# Patient Record
Sex: Male | Born: 1950
Health system: Southern US, Community
[De-identification: ages and names within clinical notes are randomized; demographics above are authoritative.]

## PROBLEM LIST (undated history)

## (undated) DIAGNOSIS — H409 Unspecified glaucoma: Secondary | ICD-10-CM

## (undated) DIAGNOSIS — E119 Type 2 diabetes mellitus without complications: Secondary | ICD-10-CM

## (undated) DIAGNOSIS — I639 Cerebral infarction, unspecified: Secondary | ICD-10-CM

## (undated) DIAGNOSIS — I10 Essential (primary) hypertension: Secondary | ICD-10-CM

## (undated) DIAGNOSIS — K219 Gastro-esophageal reflux disease without esophagitis: Secondary | ICD-10-CM

## (undated) DIAGNOSIS — G709 Myoneural disorder, unspecified: Secondary | ICD-10-CM

## (undated) DIAGNOSIS — E785 Hyperlipidemia, unspecified: Secondary | ICD-10-CM

## (undated) DIAGNOSIS — F329 Major depressive disorder, single episode, unspecified: Secondary | ICD-10-CM

## (undated) DIAGNOSIS — H548 Legal blindness, as defined in USA: Secondary | ICD-10-CM

## (undated) DIAGNOSIS — F32A Depression, unspecified: Secondary | ICD-10-CM

## (undated) DIAGNOSIS — G629 Polyneuropathy, unspecified: Secondary | ICD-10-CM

## (undated) DIAGNOSIS — N189 Chronic kidney disease, unspecified: Secondary | ICD-10-CM

## (undated) DIAGNOSIS — H269 Unspecified cataract: Secondary | ICD-10-CM

## (undated) DIAGNOSIS — M1990 Unspecified osteoarthritis, unspecified site: Secondary | ICD-10-CM

## (undated) HISTORY — DX: Myoneural disorder, unspecified: G70.9

## (undated) HISTORY — DX: Hyperlipidemia, unspecified: E78.5

## (undated) HISTORY — PX: CATARACT EXTRACTION: SUR2

## (undated) HISTORY — DX: Depression, unspecified: F32.A

## (undated) HISTORY — DX: Unspecified cataract: H26.9

## (undated) HISTORY — DX: Major depressive disorder, single episode, unspecified: F32.9

## (undated) HISTORY — DX: Legal blindness, as defined in USA: H54.8

## (undated) HISTORY — DX: Unspecified osteoarthritis, unspecified site: M19.90

---

## 2013-05-12 ENCOUNTER — Encounter (HOSPITAL_COMMUNITY): Payer: Self-pay | Admitting: Emergency Medicine

## 2013-05-12 ENCOUNTER — Emergency Department (HOSPITAL_COMMUNITY)
Admission: EM | Admit: 2013-05-12 | Discharge: 2013-05-13 | Disposition: A | Discharge status: Home / Self Care | Payer: Self-pay | Attending: Emergency Medicine | Admitting: Emergency Medicine

## 2013-05-12 DIAGNOSIS — E1169 Type 2 diabetes mellitus with other specified complication: Secondary | ICD-10-CM | POA: Insufficient documentation

## 2013-05-12 DIAGNOSIS — I10 Essential (primary) hypertension: Secondary | ICD-10-CM | POA: Insufficient documentation

## 2013-05-12 DIAGNOSIS — Z79899 Other long term (current) drug therapy: Secondary | ICD-10-CM | POA: Insufficient documentation

## 2013-05-12 DIAGNOSIS — Z7982 Long term (current) use of aspirin: Secondary | ICD-10-CM | POA: Insufficient documentation

## 2013-05-12 DIAGNOSIS — R42 Dizziness and giddiness: Secondary | ICD-10-CM | POA: Insufficient documentation

## 2013-05-12 DIAGNOSIS — F17200 Nicotine dependence, unspecified, uncomplicated: Secondary | ICD-10-CM | POA: Insufficient documentation

## 2013-05-12 DIAGNOSIS — E162 Hypoglycemia, unspecified: Secondary | ICD-10-CM

## 2013-05-12 HISTORY — DX: Essential (primary) hypertension: I10

## 2013-05-12 HISTORY — DX: Type 2 diabetes mellitus without complications: E11.9

## 2013-05-12 LAB — GLUCOSE, CAPILLARY: Glucose-Capillary: 79 mg/dL (ref 70–99)

## 2013-05-12 NOTE — ED Notes (Signed)
States he was changed to Glipizide 20 mg each am instead of 10mg  about 2-3 weeks ago and he has noted his glucose drops too much.  States he thinks the dose is too much

## 2013-05-12 NOTE — ED Notes (Signed)
Note;  Fiance reports she checked his cbg at home  Was 27  Gave him cola and called 911

## 2013-05-12 NOTE — ED Notes (Signed)
Patient presents to ER via [facility] with c/o low blood sugar.  On scene, fiance states patient was not acting right.

## 2013-05-12 NOTE — ED Notes (Signed)
Patient's CBG with FD on scene was 71.  CBG with EMS prior to arrival was 91.

## 2013-05-12 NOTE — ED Provider Notes (Signed)
CSN: [id number]     Arrival date & time 05/12/13  2021 History  This chart was scribed for Trevor Muskrat, MD by Jenne Campus, ED Scribe. This patient was seen in room APA09/APA09 and the patient's care was started at 11:36 PM.   Chief Complaint  Patient presents with  . Hypoglycemia    The history is provided by the patient. No language interpreter was used.   HPI Comments: Trevor Crawford is a 62 y.o. male with a h/o DM brought in by ambulance, who presents to the Emergency Department complaining of waxing and waning, persistent hypoglycemia for "a while", worse the past 2 days. Wife states that the CBGs have dropped into the 40s, will climb slowly towards baseline after drinking juice and then drop again. Wife states that she became concerned when the pt appeared confused and in a stupor like state tonight. Pt states that he felt lightheaded at the time but denies syncope. Pt is currently on metformin and glipizide twice daily and denies any recent missed doses. He denies being on any insulin currently. He reports chronic diarrhea attributed to his medications. He denies any recent fevers, emesis, SOB and rash as associated symptoms.  PCP is the [place] Pt is ex-[employer], finished his duty in the 2s.   Past Medical History  Diagnosis Date  . Diabetes mellitus without complication   . Hypertension    History reviewed. No pertinent past surgical history. No family history on file. History  Substance Use Topics  . Smoking status: Current Every Day Smoker  . Smokeless tobacco: Not on file  . Alcohol Use: Yes    Review of Systems  A complete 10 system review of systems was obtained and all systems are negative except as noted in the HPI and PMH.   Allergies  Review of patient's allergies indicates no known allergies.  Home Medications   Current Outpatient Rx  Name  Route  Sig  Dispense  Refill  . aspirin EC 81 MG tablet   Oral   Take 81 mg by mouth daily.         Marland Kitchen  atorvastatin (LIPITOR) 80 MG tablet   Oral   Take 40 mg by mouth at bedtime.         Marland Kitchen glipiZIDE (GLUCOTROL) 10 MG tablet   Oral   Take 20 mg by mouth 2 (two) times daily.         Marland Kitchen lisinopril (PRINIVIL,ZESTRIL) 40 MG tablet   Oral   Take 40 mg by mouth daily.         . metFORMIN (GLUCOPHAGE) 500 MG tablet   Oral   Take 1,000 mg by mouth 2 (two) times daily.         Marland Kitchen omeprazole (PRILOSEC) 20 MG capsule   Oral   Take 20 mg by mouth daily.          Triage Vitals: BP 158/72  Pulse 75  Temp(Src) 97.7 F (36.5 C) (Oral)  Resp 18  Ht 6' (1.829 m)  Wt 192 lb (87.091 kg)  BMI 26.03 kg/m2  SpO2 97%  Physical Exam  Nursing note and vitals reviewed. Constitutional: He is oriented to person, place, and time. He appears well-developed. No distress.  HENT:  Head: Normocephalic and atraumatic.  Eyes: Conjunctivae and EOM are normal.  Cardiovascular: Normal rate and regular rhythm.   Pulmonary/Chest: Effort normal. No stridor. No respiratory distress.  Abdominal: He exhibits no distension.  Musculoskeletal: He exhibits no edema.  Neurological: He  is alert and oriented to person, place, and time.  Skin: Skin is warm and dry.  Psychiatric: He has a normal mood and affect.    ED Course  Procedures (including critical care time)  Medications - No data to display  DIAGNOSTIC STUDIES: Oxygen Saturation is 97% on room air, normal by my interpretation.    COORDINATION OF CARE: 11:41 PM-Discussed treatment plan which includes medication adjustment, CBC and CMP with pt at bedside and pt agreed to plan.   Labs Review Labs Reviewed  GLUCOSE, CAPILLARY - Abnormal; Notable for the following:    Glucose-Capillary 56 (*)    All other components within normal limits  GLUCOSE, CAPILLARY   Imaging Review No results found.   2:53 AM On repeat exam the patient is eating peanut butter.  He and his family are aware of all results.  I had a lengthy discussion on medication  use, and given his description of weeks of labile sugar, with hypoglycemia his glipizide will be stopped. MDM  No diagnosis found.  I personally performed the services described in this documentation, which was scribed in my presence. The recorded information has been reviewed and is accurate.   Patient presents with concerns of ongoing hypoglycemia.  On my exam the patient is awake alert, with no neurologic deficits, beyond his blindness.  Per the patient's emergency room course remained hemodynamically stable.  He was hypoglycemic, but remained awake and alert, eating, drinking.  We had a lengthy discussion on medication use, hypoglycemia with use of multiple medications, and given the absence of distress, the chronicity of his concern, he was appropriate for discharge with further evaluation as an outpatient     Trevor Muskrat, MD 05/13/13 [phone]

## 2013-05-13 LAB — COMPREHENSIVE METABOLIC PANEL
AST: 22 U/L (ref 0–37)
Albumin: 3.5 g/dL (ref 3.5–5.2)
Alkaline Phosphatase: 98 U/L (ref 39–117)
BUN: 27 mg/dL — ABNORMAL HIGH (ref 6–23)
Creatinine, Ser: 1.44 mg/dL — ABNORMAL HIGH (ref 0.50–1.35)
Potassium: 4.4 mEq/L (ref 3.5–5.1)
Total Protein: 7.3 g/dL (ref 6.0–8.3)

## 2013-05-13 LAB — URINE MICROSCOPIC-ADD ON

## 2013-05-13 LAB — URINALYSIS, ROUTINE W REFLEX MICROSCOPIC
Glucose, UA: NEGATIVE mg/dL
Ketones, ur: NEGATIVE mg/dL
Leukocytes, UA: NEGATIVE
Urobilinogen, UA: 0.2 mg/dL (ref 0.0–1.0)
pH: 5.5 (ref 5.0–8.0)

## 2013-05-13 LAB — CBC WITH DIFFERENTIAL/PLATELET
Basophils Absolute: 0 10*3/uL (ref 0.0–0.1)
Basophils Relative: 1 % (ref 0–1)
Eosinophils Absolute: 0.4 10*3/uL (ref 0.0–0.7)
Eosinophils Relative: 5 % (ref 0–5)
Hemoglobin: 12 g/dL — ABNORMAL LOW (ref 13.0–17.0)
MCH: 29.3 pg (ref 26.0–34.0)
MCHC: 33.3 g/dL (ref 30.0–36.0)
MCV: 87.8 fL (ref 78.0–100.0)
Monocytes Absolute: 0.6 10*3/uL (ref 0.1–1.0)
Monocytes Relative: 7 % (ref 3–12)
Neutrophils Relative %: 61 % (ref 43–77)
Platelets: 233 10*3/uL (ref 150–400)
RDW: 13.5 % (ref 11.5–15.5)

## 2013-05-13 LAB — LACTIC ACID, PLASMA: Lactic Acid, Venous: 1.6 mmol/L (ref 0.5–2.2)

## 2015-06-28 ENCOUNTER — Emergency Department (HOSPITAL_COMMUNITY)
Admission: EM | Admit: 2015-06-28 | Discharge: 2015-06-28 | Disposition: A | Discharge status: Home / Self Care | Payer: Non-veteran care | Attending: Emergency Medicine | Admitting: Emergency Medicine

## 2015-06-28 ENCOUNTER — Encounter (HOSPITAL_COMMUNITY): Payer: Self-pay | Admitting: Emergency Medicine

## 2015-06-28 DIAGNOSIS — F17210 Nicotine dependence, cigarettes, uncomplicated: Secondary | ICD-10-CM | POA: Diagnosis not present

## 2015-06-28 DIAGNOSIS — N3000 Acute cystitis without hematuria: Secondary | ICD-10-CM

## 2015-06-28 DIAGNOSIS — E119 Type 2 diabetes mellitus without complications: Secondary | ICD-10-CM | POA: Insufficient documentation

## 2015-06-28 DIAGNOSIS — Z79899 Other long term (current) drug therapy: Secondary | ICD-10-CM | POA: Diagnosis not present

## 2015-06-28 DIAGNOSIS — R300 Dysuria: Secondary | ICD-10-CM | POA: Diagnosis present

## 2015-06-28 DIAGNOSIS — I10 Essential (primary) hypertension: Secondary | ICD-10-CM | POA: Insufficient documentation

## 2015-06-28 DIAGNOSIS — Z7982 Long term (current) use of aspirin: Secondary | ICD-10-CM | POA: Diagnosis not present

## 2015-06-28 LAB — BASIC METABOLIC PANEL
ANION GAP: 10 (ref 5–15)
BUN: 36 mg/dL — AB (ref 6–20)
CALCIUM: 9.3 mg/dL (ref 8.9–10.3)
CO2: 23 mmol/L (ref 22–32)
Chloride: 104 mmol/L (ref 101–111)
Creatinine, Ser: 2.1 mg/dL — ABNORMAL HIGH (ref 0.61–1.24)
GFR calc Af Amer: 37 mL/min — ABNORMAL LOW (ref >60)
GFR calc non Af Amer: 32 mL/min — ABNORMAL LOW (ref >60)
GLUCOSE: 51 mg/dL — AB (ref 65–99)
POTASSIUM: 3.9 mmol/L (ref 3.5–5.1)
SODIUM: 137 mmol/L (ref 135–145)

## 2015-06-28 LAB — URINALYSIS, ROUTINE W REFLEX MICROSCOPIC
Bilirubin Urine: NEGATIVE
GLUCOSE, UA: NEGATIVE mg/dL
KETONES UR: NEGATIVE mg/dL
Nitrite: NEGATIVE
PROTEIN: 30 mg/dL — AB
Specific Gravity, Urine: 1.025 (ref 1.005–1.030)
pH: 5 (ref 5.0–8.0)

## 2015-06-28 LAB — URINE MICROSCOPIC-ADD ON

## 2015-06-28 MED ORDER — CEPHALEXIN 500 MG PO CAPS: 500.0000 mg | ORAL_CAPSULE | Freq: Four times a day (QID) | ORAL | Status: DC

## 2015-06-28 NOTE — ED Notes (Signed)
Pt reports urinary frequency and "stinging" when he urinates.

## 2015-06-28 NOTE — ED Provider Notes (Signed)
CSN: [id number]     Arrival date & time 06/28/15  1322 History   First MD Initiated Contact with Patient 06/28/15 1729     Chief Complaint  Patient presents with  . Urinary Tract Infection     (Consider location/radiation/quality/duration/timing/severity/associated sxs/prior Treatment) HPI 64 year old male who presents with dysuria. History of hypertension and diabetes. States for the past 2 days he has been having episodes of urinary frequency, dysuria, and urinary hesitancy. Feels that he has had incomplete voiding during this time as well. Denies any abdominal pain, flank pain or back pain, hematuria, nausea or vomiting, diarrhea, fevers or chills. States that he otherwise feels as if he is in his usual state of health. Denies having any prostate issues, and denies any known history of prior UTIs. Past Medical History  Diagnosis Date  . Diabetes mellitus without complication ([place])   . Hypertension    History reviewed. No pertinent past surgical history. Family History  Problem Relation Age of Onset  . Cancer Mother   . Alcoholism Father   . Cancer Other    Social History  Substance Use Topics  . Smoking status: Current Every Day Smoker -- 1.00 packs/day    Types: Cigarettes  . Smokeless tobacco: Never Used  . Alcohol Use: No    Review of Systems 10/14 systems reviewed and are negative other than those stated in the HPI   Allergies  Review of patient's allergies indicates no known allergies.  Home Medications   Prior to Admission medications   Medication Sig Start Date End Date Taking? Authorizing Provider  aspirin EC 81 MG tablet Take 81 mg by mouth daily.   Yes Historical Provider, MD  atorvastatin (LIPITOR) 80 MG tablet Take 40 mg by mouth at bedtime.    Historical Provider, MD  cephALEXin (KEFLEX) 500 MG capsule Take 1 capsule (500 mg total) by mouth 4 (four) times daily. 06/28/15   Forde Dandy, MD  lisinopril (PRINIVIL,ZESTRIL) 40 MG tablet Take 40 mg by mouth  daily.    Historical Provider, MD  metFORMIN (GLUCOPHAGE) 500 MG tablet Take 1,000 mg by mouth 2 (two) times daily.    Historical Provider, MD  omeprazole (PRILOSEC) 20 MG capsule Take 20 mg by mouth daily.    Historical Provider, MD   BP 147/79 mmHg  Pulse 91  Temp(Src) 98 F (36.7 C) (Oral)  Resp 18  Ht 6' (1.829 m)  Wt 192 lb (87.091 kg)  BMI 26.03 kg/m2  SpO2 100% Physical Exam Physical Exam  Nursing note and vitals reviewed. Constitutional: Well developed, well nourished, non-toxic, and in no acute distress Head: Normocephalic and atraumatic.  Mouth/Throat: Oropharynx is clear and moist.  Neck: Normal range of motion. Neck supple.  Cardiovascular: Normal rate and regular rhythm.   Pulmonary/Chest: Effort normal and breath sounds normal.  Abdominal: Soft. There is no tenderness. There is no rebound and no guarding. No CVA tenderness. Musculoskeletal: Normal range of motion.  Neurological: Alert, no facial droop, fluent speech, moves all extremities symmetrically Skin: Skin is warm and dry.  Psychiatric: Cooperative  ED Course  Procedures (including critical care time) Labs Review Labs Reviewed  URINALYSIS, ROUTINE W REFLEX MICROSCOPIC (NOT AT [facility]) - Abnormal; Notable for the following:    Hgb urine dipstick SMALL (*)    Protein, ur 30 (*)    Leukocytes, UA TRACE (*)    All other components within normal limits  BASIC METABOLIC PANEL - Abnormal; Notable for the following:    Glucose, Bld 51 (*)  BUN 36 (*)    Creatinine, Ser 2.10 (*)    GFR calc non Af Amer 32 (*)    GFR calc Af Amer 37 (*)    All other components within normal limits  URINE MICROSCOPIC-ADD ON - Abnormal; Notable for the following:    Squamous Epithelial / LPF 0-5 (*)    Bacteria, UA FEW (*)    Casts HYALINE CASTS (*)    All other components within normal limits  URINE CULTURE       I have personally reviewed and evaluated these images and lab results as part of my medical  decision-making.    MDM   Final diagnoses:  Acute cystitis without hematuria    64 year old male who presents with 2 days of dysuria and urinary frequency. Well-appearing with stable vital signs. Abdomen is benign and no evidence of CVA tenderness. No concern for pyelonephritis or systemic illness. UA does show evidence of bacteria 30 WBCs and leukocyte esterase. This is sent for culture. He is treated for cystis with course of keflex. Urine culture is sent and pending. Strict return and follow-up instructions are reviewed. He expressed understanding of all discharge instructions for comfortable to plan of care.    Forde Dandy, MD 06/28/15 [place]

## 2015-06-28 NOTE — Discharge Instructions (Signed)
Please take antibiotics as prescribed. Return without fail for worsening symptoms including fevers, vomiting unable to keep down food or fluids, severe abdominal or back pain, or any other symptoms concerning to you.  Urinary Tract Infection A urinary tract infection (UTI) can occur any place along the urinary tract. The tract includes the kidneys, ureters, bladder, and urethra. A type of germ called bacteria often causes a UTI. UTIs are often helped with antibiotic medicine.  HOME CARE   If given, take antibiotics as told by your doctor. Finish them even if you start to feel better.  Drink enough fluids to keep your pee (urine) clear or pale yellow.  Avoid tea, drinks with caffeine, and bubbly (carbonated) drinks.  Pee often. Avoid holding your pee in for a long time.  Pee before and after having sex (intercourse).  Wipe from front to back after you poop (bowel movement) if you are a woman. Use each tissue only once. GET HELP RIGHT AWAY IF:   You have back pain.  You have lower belly (abdominal) pain.  You have chills.  You feel sick to your stomach (nauseous).  You throw up (vomit).  Your burning or discomfort with peeing does not go away.  You have a fever.  Your symptoms are not better in 3 days. MAKE SURE YOU:   Understand these instructions.  Will watch your condition.  Will get help right away if you are not doing well or get worse.   This information is not intended to replace advice given to you by your health care provider. Make sure you discuss any questions you have with your health care provider.   Document Released: 01/09/2008 Document Revised: 08/13/2014 Document Reviewed: 02/21/2012 Elsevier Interactive Patient Education [employer].

## 2015-06-28 NOTE — ED Notes (Signed)
Bladder can revealed 335 ml post void residual.

## 2015-06-29 LAB — URINE CULTURE

## 2015-10-05 ENCOUNTER — Encounter (HOSPITAL_COMMUNITY): Payer: Self-pay | Admitting: Emergency Medicine

## 2015-10-05 ENCOUNTER — Emergency Department (HOSPITAL_COMMUNITY): Payer: Non-veteran care

## 2015-10-05 ENCOUNTER — Observation Stay (HOSPITAL_COMMUNITY)
Admission: EM | Admit: 2015-10-05 | Discharge: 2015-10-07 | Disposition: A | Discharge status: Home / Self Care | Payer: Non-veteran care | Attending: Urology | Admitting: Urology

## 2015-10-05 DIAGNOSIS — F17210 Nicotine dependence, cigarettes, uncomplicated: Secondary | ICD-10-CM | POA: Diagnosis not present

## 2015-10-05 DIAGNOSIS — N23 Unspecified renal colic: Secondary | ICD-10-CM

## 2015-10-05 DIAGNOSIS — Z7982 Long term (current) use of aspirin: Secondary | ICD-10-CM | POA: Insufficient documentation

## 2015-10-05 DIAGNOSIS — I10 Essential (primary) hypertension: Secondary | ICD-10-CM | POA: Diagnosis not present

## 2015-10-05 DIAGNOSIS — N200 Calculus of kidney: Secondary | ICD-10-CM

## 2015-10-05 DIAGNOSIS — Z79899 Other long term (current) drug therapy: Secondary | ICD-10-CM | POA: Diagnosis not present

## 2015-10-05 DIAGNOSIS — E119 Type 2 diabetes mellitus without complications: Secondary | ICD-10-CM | POA: Insufficient documentation

## 2015-10-05 DIAGNOSIS — N132 Hydronephrosis with renal and ureteral calculous obstruction: Secondary | ICD-10-CM | POA: Diagnosis not present

## 2015-10-05 DIAGNOSIS — Z794 Long term (current) use of insulin: Secondary | ICD-10-CM | POA: Insufficient documentation

## 2015-10-05 DIAGNOSIS — R109 Unspecified abdominal pain: Secondary | ICD-10-CM | POA: Diagnosis present

## 2015-10-05 DIAGNOSIS — N1330 Unspecified hydronephrosis: Secondary | ICD-10-CM

## 2015-10-05 DIAGNOSIS — N134 Hydroureter: Secondary | ICD-10-CM

## 2015-10-05 DIAGNOSIS — N201 Calculus of ureter: Secondary | ICD-10-CM | POA: Diagnosis present

## 2015-10-05 LAB — URINALYSIS, ROUTINE W REFLEX MICROSCOPIC
Bilirubin Urine: NEGATIVE
Glucose, UA: NEGATIVE mg/dL
Ketones, ur: NEGATIVE mg/dL
Leukocytes, UA: NEGATIVE
NITRITE: NEGATIVE
Protein, ur: 100 mg/dL — AB
SPECIFIC GRAVITY, URINE: 1.02 (ref 1.005–1.030)
pH: 6.5 (ref 5.0–8.0)

## 2015-10-05 LAB — COMPREHENSIVE METABOLIC PANEL
ALBUMIN: 3.9 g/dL (ref 3.5–5.0)
ALT: 22 U/L (ref 17–63)
ANION GAP: 9 (ref 5–15)
AST: 20 U/L (ref 15–41)
Alkaline Phosphatase: 92 U/L (ref 38–126)
BILIRUBIN TOTAL: 0.5 mg/dL (ref 0.3–1.2)
BUN: 30 mg/dL — ABNORMAL HIGH (ref 6–20)
CO2: 26 mmol/L (ref 22–32)
Calcium: 9.4 mg/dL (ref 8.9–10.3)
Chloride: 107 mmol/L (ref 101–111)
Creatinine, Ser: 2.05 mg/dL — ABNORMAL HIGH (ref 0.61–1.24)
GFR calc Af Amer: 38 mL/min — ABNORMAL LOW (ref >60)
GFR, EST NON AFRICAN AMERICAN: 33 mL/min — AB (ref >60)
Glucose, Bld: 81 mg/dL (ref 65–99)
POTASSIUM: 5 mmol/L (ref 3.5–5.1)
Sodium: 142 mmol/L (ref 135–145)
TOTAL PROTEIN: 7.5 g/dL (ref 6.5–8.1)

## 2015-10-05 LAB — CBC
HCT: 39.8 % (ref 39.0–52.0)
HEMOGLOBIN: 13.1 g/dL (ref 13.0–17.0)
MCH: 29.4 pg (ref 26.0–34.0)
MCHC: 32.9 g/dL (ref 30.0–36.0)
MCV: 89.4 fL (ref 78.0–100.0)
Platelets: 213 10*3/uL (ref 150–400)
RBC: 4.45 MIL/uL (ref 4.22–5.81)
RDW: 14.3 % (ref 11.5–15.5)
WBC: 9 10*3/uL (ref 4.0–10.5)

## 2015-10-05 LAB — LIPASE, BLOOD: Lipase: 25 U/L (ref 11–51)

## 2015-10-05 LAB — URINE MICROSCOPIC-ADD ON

## 2015-10-05 MED ORDER — ONDANSETRON HCL 4 MG/2ML IJ SOLN
4.0000 mg | INTRAMUSCULAR | Status: AC | PRN
  Administered 2015-10-05 (×2): 4 mg via INTRAVENOUS
  Filled 2015-10-05 (×2): qty 2

## 2015-10-05 MED ORDER — MORPHINE SULFATE (PF) 4 MG/ML IV SOLN
4.0000 mg | INTRAVENOUS | Status: AC | PRN
  Administered 2015-10-05 (×2): 4 mg via INTRAVENOUS
  Filled 2015-10-05 (×2): qty 1

## 2015-10-05 MED ORDER — ATORVASTATIN CALCIUM 40 MG PO TABS
40.0000 mg | ORAL_TABLET | Freq: Every day | ORAL | Status: DC
  Administered 2015-10-06: 40 mg via ORAL
  Filled 2015-10-05: qty 1

## 2015-10-05 MED ORDER — LATANOPROST 0.005 % OP SOLN
1.0000 [drp] | Freq: Every day | OPHTHALMIC | Status: DC
  Administered 2015-10-06: 1 [drp] via OPHTHALMIC
  Filled 2015-10-05: qty 2.5

## 2015-10-05 MED ORDER — FUROSEMIDE 40 MG PO TABS
40.0000 mg | ORAL_TABLET | Freq: Every morning | ORAL | Status: DC
  Administered 2015-10-06: 40 mg via ORAL
  Filled 2015-10-05: qty 1

## 2015-10-05 MED ORDER — ASPIRIN EC 81 MG PO TBEC
81.0000 mg | DELAYED_RELEASE_TABLET | Freq: Every day | ORAL | Status: DC
  Administered 2015-10-06: 81 mg via ORAL
  Filled 2015-10-05: qty 1

## 2015-10-05 MED ORDER — PANTOPRAZOLE SODIUM 40 MG PO TBEC
40.0000 mg | DELAYED_RELEASE_TABLET | Freq: Every day | ORAL | Status: DC
  Administered 2015-10-06: 40 mg via ORAL
  Filled 2015-10-05: qty 1

## 2015-10-05 NOTE — ED Notes (Signed)
Report to [place], [place], [employer] 4th floor

## 2015-10-05 NOTE — ED Notes (Signed)
C/o left lower abdominal pain since this am, rates pain 10/10.  Did not take any pain medication this am.

## 2015-10-05 NOTE — ED Notes (Signed)
Pt reports that he had left flank and groin pain since this am- Abd is soft non tender. Pt denies pain with urination   Pt is blind

## 2015-10-05 NOTE — ED Provider Notes (Signed)
CSN: [id number]     Arrival date & time 10/05/15  1244 History   First MD Initiated Contact with Patient 10/05/15 1546     Chief Complaint  Patient presents with  . Abdominal Pain  . Flank Pain      HPI Pt was seen at 1600. Per pt, c/o sudden onset and persistence of waxing and waning left sided flank "pain" that began several months ago, worse since this morning.  Pt describes the pain as radiating into the left side of his abd.  Has been associated with no other symptoms.  Denies testicular pain/swelling, no dysuria/hematuria, no abd pain, no diarrhea, no black or blood in emesis, no CP/SOB, no fevers, no rash.      Past Medical History  Diagnosis Date  . Diabetes mellitus without complication ([place])   . Hypertension    History reviewed. No pertinent past surgical history.   Family History  Problem Relation Age of Onset  . Cancer Mother   . Alcoholism Father   . Cancer Other    Social History  Substance Use Topics  . Smoking status: Current Every Day Smoker -- 1.00 packs/day    Types: Cigarettes  . Smokeless tobacco: Never Used  . Alcohol Use: No    Review of Systems ROS: Statement: All systems negative except as marked or noted in the HPI; Constitutional: Negative for fever and chills. ; ; Eyes: Negative for eye pain, redness and discharge. ; ; ENMT: Negative for ear pain, hoarseness, nasal congestion, sinus pressure and sore throat. ; ; Cardiovascular: Negative for chest pain, palpitations, diaphoresis, dyspnea and peripheral edema. ; ; Respiratory: Negative for cough, wheezing and stridor. ; ; Gastrointestinal: +abd pain. Negative for nausea, vomiting, diarrhea, blood in stool, hematemesis, jaundice and rectal bleeding. . ; ; Genitourinary: Negative for dysuria, flank pain and hematuria. ; ; Genital:  No penile drainage or rash, no testicular pain or swelling, no scrotal rash or swelling. ;; Musculoskeletal: +back pain. Negative for neck pain. Negative for swelling and  trauma.; ; Skin: Negative for pruritus, rash, abrasions, blisters, bruising and skin lesion.; ; Neuro: Negative for headache, lightheadedness and neck stiffness. Negative for weakness, altered level of consciousness , altered mental status, extremity weakness, paresthesias, involuntary movement, seizure and syncope.      Allergies  Review of patient's allergies indicates no known allergies.  Home Medications   Prior to Admission medications   Medication Sig Start Date End Date Taking? Authorizing Provider  aspirin EC 81 MG tablet Take 81 mg by mouth daily.    Historical Provider, MD  atorvastatin (LIPITOR) 80 MG tablet Take 40 mg by mouth at bedtime.    Historical Provider, MD  cephALEXin (KEFLEX) 500 MG capsule Take 1 capsule (500 mg total) by mouth 4 (four) times daily. 06/28/15   Forde Dandy, MD  lisinopril (PRINIVIL,ZESTRIL) 40 MG tablet Take 40 mg by mouth daily.    Historical Provider, MD  metFORMIN (GLUCOPHAGE) 500 MG tablet Take 1,000 mg by mouth 2 (two) times daily.    Historical Provider, MD  omeprazole (PRILOSEC) 20 MG capsule Take 20 mg by mouth daily.    Historical Provider, MD   BP 177/83 mmHg  Pulse 65  Temp(Src) 97.8 F (36.6 C) (Oral)  Resp 16  Ht 6' (1.829 m)  Wt 198 lb (89.812 kg)  BMI 26.85 kg/m2  SpO2 99% Physical Exam  1605: Physical examination:  Nursing notes reviewed; Vital signs and O2 SAT reviewed;  Constitutional: Thin, In no acute  distress; Head:  Normocephalic, atraumatic; Eyes: EOMI, PERRL, No scleral icterus; ENMT: Mouth and pharynx normal, Mucous membranes dry; Neck: Supple, Full range of motion, No lymphadenopathy; Cardiovascular: Regular rate and rhythm, No gallop; Respiratory: Breath sounds clear & equal bilaterally, No wheezes.  Speaking full sentences with ease, Normal respiratory effort/excursion; Chest: Nontender, Movement normal; Abdomen: Soft, Nontender, Nondistended, Normal bowel sounds.; Genitourinary: No CVA tenderness; Spine:  No midline CS,  TS, LS tenderness. +TTP left lumbar paraspinal muscles and lateral torso areas.;; Extremities: Pulses normal, No tenderness, No edema, No calf edema or asymmetry.; Neuro: AA&Ox3, +eye patch over right eye. Major CN grossly intact.  Speech clear. No gross focal motor or sensory deficits in extremities.; Skin: Color normal, Warm, Dry.   ED Course  Procedures (including critical care time) Labs Review   Imaging Review  I have personally reviewed and evaluated these images and lab results as part of my medical decision-making.   EKG Interpretation None      MDM  MDM Reviewed: previous chart, nursing note and vitals Reviewed previous: labs Interpretation: labs and CT scan     Results for orders placed or performed during the hospital encounter of 10/05/15  Lipase, blood  Result Value Ref Range   Lipase 25 11 - 51 U/L  Comprehensive metabolic panel  Result Value Ref Range   Sodium 142 135 - 145 mmol/L   Potassium 5.0 3.5 - 5.1 mmol/L   Chloride 107 101 - 111 mmol/L   CO2 26 22 - 32 mmol/L   Glucose, Bld 81 65 - 99 mg/dL   BUN 30 (H) 6 - 20 mg/dL   Creatinine, Ser 2.05 (H) 0.61 - 1.24 mg/dL   Calcium 9.4 8.9 - 10.3 mg/dL   Total Protein 7.5 6.5 - 8.1 g/dL   Albumin 3.9 3.5 - 5.0 g/dL   AST 20 15 - 41 U/L   ALT 22 17 - 63 U/L   Alkaline Phosphatase 92 38 - 126 U/L   Total Bilirubin 0.5 0.3 - 1.2 mg/dL   GFR calc non Af Amer 33 (L) >60 mL/min   GFR calc Af Amer 38 (L) >60 mL/min   Anion gap 9 5 - 15  CBC  Result Value Ref Range   WBC 9.0 4.0 - 10.5 K/uL   RBC 4.45 4.22 - 5.81 MIL/uL   Hemoglobin 13.1 13.0 - 17.0 g/dL   HCT 39.8 39.0 - 52.0 %   MCV 89.4 78.0 - 100.0 fL   MCH 29.4 26.0 - 34.0 pg   MCHC 32.9 30.0 - 36.0 g/dL   RDW 14.3 11.5 - 15.5 %   Platelets 213 150 - 400 K/uL  Urinalysis, Routine w reflex microscopic (not at [facility])  Result Value Ref Range   Color, Urine YELLOW YELLOW   APPearance CLEAR CLEAR   Specific Gravity, Urine 1.020 1.005 - 1.030   pH 6.5  5.0 - 8.0   Glucose, UA NEGATIVE NEGATIVE mg/dL   Hgb urine dipstick SMALL (A) NEGATIVE   Bilirubin Urine NEGATIVE NEGATIVE   Ketones, ur NEGATIVE NEGATIVE mg/dL   Protein, ur 100 (A) NEGATIVE mg/dL   Nitrite NEGATIVE NEGATIVE   Leukocytes, UA NEGATIVE NEGATIVE  Urine microscopic-add on  Result Value Ref Range   Squamous Epithelial / LPF 0-5 (A) NONE SEEN   WBC, UA 0-5 0 - 5 WBC/hpf   RBC / HPF 6-30 0 - 5 RBC/hpf   Bacteria, UA RARE (A) NONE SEEN   Sperm, UA PRESENT    Ct Renal  Stone Study 10/05/2015  CLINICAL DATA:  Left flank flank pain and lower abdominal pain starting today EXAM: CT ABDOMEN AND PELVIS WITHOUT CONTRAST TECHNIQUE: Multidetector CT imaging of the abdomen and pelvis was performed following the standard protocol without IV contrast. COMPARISON:  None. FINDINGS: Lung bases are unremarkable.  Small hiatal hernia. Mild degenerative changes thoracolumbar spine. Unenhanced liver shows no biliary ductal dilatation. No calcified gallstones are noted within gallbladder. Atherosclerotic calcifications are noted bilateral renal artery origin. Unenhanced pancreas spleen and adrenal glands are unremarkable. There is mild left hydronephrosis and proximal left hydroureter. Significant left perinephric and left upper periureteral stranding At least 2 or 3 calcified calculi are noted within left kidney the largest in lower pole measures 1.1 cm. Multiple obstructive calculi are noted in proximal left ureter. The largest in coronal image 51 measures about 5 mm about lower endplate of L3 vertebral body. Additional 3 or 4 calculi are noted in proximal left ureter in a linear distribution please see coronal image 51. There is another calculus in mid left ureter measures about 4 mm axial image 45 at the level of lower endplate of L4 vertebral body. Bilateral distal ureter is unremarkable. No calcified calculi are noted within urinary bladder. There is a punctate nonobstructive calculus in lower pole of the  right kidney measures 3 mm. No small bowel obstruction. Atherosclerotic calcifications are noted bilateral common iliac artery and external iliac arteries. Prostate gland is unremarkable. No ascites or free air. No pericecal inflammation. Normal appendix is noted in axial image 47. Colonic diverticula are noted in right colon. There is no evidence of acute diverticulitis. Some colonic stool noted throughout the colon. IMPRESSION: 1. There is mild left hydronephrosis and proximal left hydroureter. Bilateral nephrolithiasis. Significant left perinephric and proximal left periureteral stranding. 2. Multiple partially obstructive calculi are noted in proximal left ureter in a string/ linear distribution. The largest in proximal left ureter about the lower endplate level of L3 vertebral body measures 5 mm. The largest in mid left ureter about the level of lower endplate of L4 vertebral body measures about 4 mm. No distal ureteral calculi are noted. No calcified calculi are noted within urinary bladder. 3. No small bowel obstruction. 4. Normal appendix.  No pericecal inflammation. 5. Unremarkable prostate gland. 6. Mild degenerative changes lumbar spine. These results were called by telephone at the time of interpretation on 10/05/2015 at 5:06 pm to Dr. Francine Graven , who verbally acknowledged these results. Electronically Signed   By: Lahoma Crocker M.D.   On: 10/05/2015 17:06   Results for Trevor Crawford, Trevor Crawford (MRN [id number]) as of 10/05/2015 20:08  Ref. Range 05/12/2013 23:58 06/28/2015 18:06 10/05/2015 13:51  BUN Latest Ref Range: 6-20 mg/dL 27 (H) 36 (H) 30 (H)  Creatinine Latest Ref Range: 0.61-1.24 mg/dL 1.44 (H) 2.10 (H) 2.05 (H)   1750:  CT as above. Cr elevated for past several months from baseline. T/C to Uro Dr. Alyson Ingles, case discussed, including:  HPI, pertinent PM/SHx, VS/PE, dx testing, ED course and treatment:  Agrees with ED workup, states pt will be unable to pass stones on his own and will need intervention,  if can get pt's pain under control he can be discharged to f/u in [facility] clinic tomorrow; if cannot get pain under control then call back.  1945:  Pt continues to c/o pain and nausea despite multiple doses of meds. Will re-dose.  T/C to Uro Dr. Alyson Ingles, case discussed, including:  HPI, pertinent PM/SHx, VS/PE, dx testing, ED course and  treatment:  Agreeable to accept transfer/admit to [facility], requests to write temporary orders, obtain 4th floor medical bed to his service.   Francine Graven, DO 10/08/15 1609

## 2015-10-06 ENCOUNTER — Encounter (HOSPITAL_COMMUNITY): Payer: Self-pay

## 2015-10-06 ENCOUNTER — Observation Stay (HOSPITAL_COMMUNITY): Payer: Non-veteran care

## 2015-10-06 ENCOUNTER — Observation Stay (HOSPITAL_COMMUNITY): Payer: Non-veteran care | Admitting: Anesthesiology

## 2015-10-06 ENCOUNTER — Other Ambulatory Visit: Payer: Self-pay | Admitting: Urology

## 2015-10-06 ENCOUNTER — Encounter (HOSPITAL_COMMUNITY)
Admission: EM | Disposition: A | Discharge status: Home / Self Care | Payer: Self-pay | Source: Home / Self Care | Attending: Emergency Medicine

## 2015-10-06 ENCOUNTER — Other Ambulatory Visit: Payer: Self-pay

## 2015-10-06 DIAGNOSIS — E119 Type 2 diabetes mellitus without complications: Secondary | ICD-10-CM | POA: Diagnosis not present

## 2015-10-06 DIAGNOSIS — I10 Essential (primary) hypertension: Secondary | ICD-10-CM | POA: Diagnosis not present

## 2015-10-06 DIAGNOSIS — Z7982 Long term (current) use of aspirin: Secondary | ICD-10-CM | POA: Diagnosis not present

## 2015-10-06 DIAGNOSIS — N200 Calculus of kidney: Secondary | ICD-10-CM

## 2015-10-06 DIAGNOSIS — N132 Hydronephrosis with renal and ureteral calculous obstruction: Secondary | ICD-10-CM | POA: Diagnosis not present

## 2015-10-06 HISTORY — PX: CYSTOSCOPY WITH RETROGRADE PYELOGRAM, URETEROSCOPY AND STENT PLACEMENT: SHX5789

## 2015-10-06 LAB — GLUCOSE, CAPILLARY
GLUCOSE-CAPILLARY: 80 mg/dL (ref 65–99)
GLUCOSE-CAPILLARY: 78 mg/dL (ref 65–99)
GLUCOSE-CAPILLARY: 83 mg/dL (ref 65–99)
GLUCOSE-CAPILLARY: 274 mg/dL — AB (ref 65–99)
Glucose-Capillary: 52 mg/dL — ABNORMAL LOW (ref 65–99)
Glucose-Capillary: 94 mg/dL (ref 65–99)

## 2015-10-06 LAB — SURGICAL PCR SCREEN
MRSA, PCR: NEGATIVE
Staphylococcus aureus: NEGATIVE

## 2015-10-06 SURGERY — CYSTOURETEROSCOPY, WITH RETROGRADE PYELOGRAM AND STENT INSERTION
Anesthesia: General | Laterality: Left

## 2015-10-06 MED ORDER — DIPHENHYDRAMINE HCL 50 MG/ML IJ SOLN: 12.5000 mg | Freq: Four times a day (QID) | INTRAMUSCULAR | Status: DC | PRN

## 2015-10-06 MED ORDER — SODIUM CHLORIDE 0.9 % IR SOLN
Status: DC | PRN
  Administered 2015-10-06: 500 mL

## 2015-10-06 MED ORDER — ONDANSETRON HCL 4 MG/2ML IJ SOLN: 4.0000 mg | INTRAMUSCULAR | Status: DC | PRN

## 2015-10-06 MED ORDER — OXYCODONE HCL 5 MG PO TABS: 5.0000 mg | ORAL_TABLET | ORAL | Status: DC | PRN

## 2015-10-06 MED ORDER — CEFAZOLIN SODIUM-DEXTROSE 2-3 GM-% IV SOLR
INTRAVENOUS | Status: AC
  Filled 2015-10-06: qty 50

## 2015-10-06 MED ORDER — BRIMONIDINE TARTRATE 0.2 % OP SOLN
1.0000 [drp] | Freq: Two times a day (BID) | OPHTHALMIC | Status: DC
  Administered 2015-10-06 – 2015-10-07 (×2): 1 [drp] via OPHTHALMIC
  Filled 2015-10-06 (×2): qty 5

## 2015-10-06 MED ORDER — PROPOFOL 10 MG/ML IV BOLUS
INTRAVENOUS | Status: AC
  Filled 2015-10-06: qty 20

## 2015-10-06 MED ORDER — SODIUM CHLORIDE 0.9 % IV SOLN: INTRAVENOUS | Status: DC

## 2015-10-06 MED ORDER — LIDOCAINE HCL (CARDIAC) 20 MG/ML IV SOLN
INTRAVENOUS | Status: AC
  Filled 2015-10-06: qty 5

## 2015-10-06 MED ORDER — ACETAMINOPHEN 325 MG PO TABS: 650.0000 mg | ORAL_TABLET | ORAL | Status: DC | PRN

## 2015-10-06 MED ORDER — PROPOFOL 10 MG/ML IV BOLUS
INTRAVENOUS | Status: DC | PRN
  Administered 2015-10-06: 200 mg via INTRAVENOUS

## 2015-10-06 MED ORDER — DEXTROSE 50 % IV SOLN
INTRAVENOUS | Status: AC
  Administered 2015-10-06: 25 mL
  Filled 2015-10-06: qty 50

## 2015-10-06 MED ORDER — ONDANSETRON HCL 4 MG/2ML IJ SOLN: 4.0000 mg | Freq: Three times a day (TID) | INTRAMUSCULAR | Status: DC | PRN

## 2015-10-06 MED ORDER — DIPHENHYDRAMINE HCL 12.5 MG/5ML PO ELIX: 12.5000 mg | ORAL_SOLUTION | Freq: Four times a day (QID) | ORAL | Status: DC | PRN

## 2015-10-06 MED ORDER — FENTANYL CITRATE (PF) 250 MCG/5ML IJ SOLN
INTRAMUSCULAR | Status: AC
  Filled 2015-10-06: qty 5

## 2015-10-06 MED ORDER — INSULIN ASPART 100 UNIT/ML ~~LOC~~ SOLN
0.0000 [IU] | Freq: Three times a day (TID) | SUBCUTANEOUS | Status: DC
  Administered 2015-10-07: 5 [IU] via SUBCUTANEOUS

## 2015-10-06 MED ORDER — ZOLPIDEM TARTRATE 5 MG PO TABS: 5.0000 mg | ORAL_TABLET | Freq: Every evening | ORAL | Status: DC | PRN

## 2015-10-06 MED ORDER — ONDANSETRON HCL 4 MG/2ML IJ SOLN
INTRAMUSCULAR | Status: DC | PRN
Start: 2015-10-06 — End: 2015-10-06
  Administered 2015-10-06: 4 mg via INTRAVENOUS

## 2015-10-06 MED ORDER — DEXAMETHASONE SODIUM PHOSPHATE 10 MG/ML IJ SOLN
INTRAMUSCULAR | Status: AC
  Filled 2015-10-06: qty 1

## 2015-10-06 MED ORDER — DORZOLAMIDE HCL-TIMOLOL MAL 2-0.5 % OP SOLN
1.0000 [drp] | Freq: Two times a day (BID) | OPHTHALMIC | Status: DC
  Administered 2015-10-06 – 2015-10-07 (×2): 1 [drp] via OPHTHALMIC
  Filled 2015-10-06 (×2): qty 10

## 2015-10-06 MED ORDER — SUCCINYLCHOLINE CHLORIDE 20 MG/ML IJ SOLN
INTRAMUSCULAR | Status: DC | PRN
  Administered 2015-10-06: 100 mg via INTRAVENOUS

## 2015-10-06 MED ORDER — ALBUTEROL SULFATE HFA 108 (90 BASE) MCG/ACT IN AERS
INHALATION_SPRAY | RESPIRATORY_TRACT | Status: DC | PRN
  Administered 2015-10-06: 5 via RESPIRATORY_TRACT

## 2015-10-06 MED ORDER — MIDAZOLAM HCL 5 MG/5ML IJ SOLN
INTRAMUSCULAR | Status: DC | PRN
  Administered 2015-10-06: 2 mg via INTRAVENOUS

## 2015-10-06 MED ORDER — PROMETHAZINE HCL 25 MG/ML IJ SOLN: 6.2500 mg | INTRAMUSCULAR | Status: DC | PRN

## 2015-10-06 MED ORDER — CEFAZOLIN SODIUM-DEXTROSE 2-3 GM-% IV SOLR
2.0000 g | INTRAVENOUS | Status: AC
  Administered 2015-10-06: 2 g via INTRAVENOUS

## 2015-10-06 MED ORDER — FENTANYL CITRATE (PF) 100 MCG/2ML IJ SOLN
INTRAMUSCULAR | Status: DC | PRN
  Administered 2015-10-06 (×2): 50 ug via INTRAVENOUS

## 2015-10-06 MED ORDER — HYDROMORPHONE HCL 1 MG/ML IJ SOLN: 0.5000 mg | INTRAMUSCULAR | Status: DC | PRN

## 2015-10-06 MED ORDER — FENTANYL CITRATE (PF) 100 MCG/2ML IJ SOLN: 25.0000 ug | INTRAMUSCULAR | Status: DC | PRN

## 2015-10-06 MED ORDER — HYOSCYAMINE SULFATE 0.125 MG SL SUBL
0.1250 mg | SUBLINGUAL_TABLET | SUBLINGUAL | Status: DC | PRN
  Filled 2015-10-06: qty 1

## 2015-10-06 MED ORDER — POLYVINYL ALCOHOL 1.4 % OP SOLN
1.0000 [drp] | Freq: Four times a day (QID) | OPHTHALMIC | Status: DC
  Administered 2015-10-06 (×2): 1 [drp] via OPHTHALMIC
  Filled 2015-10-06: qty 15

## 2015-10-06 MED ORDER — DEXAMETHASONE SODIUM PHOSPHATE 10 MG/ML IJ SOLN
INTRAMUSCULAR | Status: DC | PRN
  Administered 2015-10-06: 10 mg via INTRAVENOUS

## 2015-10-06 MED ORDER — ONDANSETRON HCL 4 MG/2ML IJ SOLN
INTRAMUSCULAR | Status: AC
  Filled 2015-10-06: qty 2

## 2015-10-06 MED ORDER — IOHEXOL 300 MG/ML  SOLN
INTRAMUSCULAR | Status: DC | PRN
  Administered 2015-10-06: 15 mL via URETHRAL

## 2015-10-06 MED ORDER — ALBUTEROL SULFATE HFA 108 (90 BASE) MCG/ACT IN AERS
INHALATION_SPRAY | RESPIRATORY_TRACT | Status: AC
  Filled 2015-10-06: qty 6.7

## 2015-10-06 MED ORDER — MIDAZOLAM HCL 2 MG/2ML IJ SOLN
INTRAMUSCULAR | Status: AC
  Filled 2015-10-06: qty 2

## 2015-10-06 MED ORDER — LIDOCAINE HCL (CARDIAC) 10 MG/ML IV SOLN
INTRAVENOUS | Status: DC | PRN
  Administered 2015-10-06: 100 mg via INTRAVENOUS

## 2015-10-06 MED ORDER — SODIUM CHLORIDE 0.9 % IV SOLN
INTRAVENOUS | Status: DC
  Administered 2015-10-06 – 2015-10-07 (×4): via INTRAVENOUS

## 2015-10-06 SURGICAL SUPPLY — 14 items
BAG URO CATCHER STRL LF (MISCELLANEOUS) ×3 IMPLANT
BASKET DAKOTA 1.9FR 11X120 (BASKET) IMPLANT
CATH INTERMIT  6FR 70CM (CATHETERS) ×3 IMPLANT
CLOTH BEACON ORANGE TIMEOUT ST (SAFETY) ×3 IMPLANT
GLOVE BIOGEL M 8.0 STRL (GLOVE) ×3 IMPLANT
GOWN STRL REUS W/TWL LRG LVL3 (GOWN DISPOSABLE) ×6 IMPLANT
GUIDEWIRE ANG ZIPWIRE 038X150 (WIRE) IMPLANT
GUIDEWIRE STR DUAL SENSOR (WIRE) ×3 IMPLANT
MANIFOLD NEPTUNE II (INSTRUMENTS) ×3 IMPLANT
PACK CYSTO (CUSTOM PROCEDURE TRAY) ×3 IMPLANT
SHEATH ACCESS URETERAL 38CM (SHEATH) IMPLANT
STENT CONTOUR 6FRX26X.038 (STENTS) ×3 IMPLANT
TUBING CONNECTING 10 (TUBING) ×2 IMPLANT
TUBING CONNECTING 10' (TUBING) ×1

## 2015-10-06 NOTE — Anesthesia Preprocedure Evaluation (Addendum)
Anesthesia Evaluation  Patient identified by MRN, date of birth, ID band Patient awake    Reviewed: Allergy & Precautions, H&P , NPO status , Patient's Chart, lab work & pertinent test results  History of Anesthesia Complications Negative for: history of anesthetic complications  Airway Mallampati: I  TM Distance: >3 FB Neck ROM: full    Dental  (+) Missing, Poor Dentition, Loose   Pulmonary Current Smoker,    Pulmonary exam normal breath sounds clear to auscultation       Cardiovascular hypertension, Normal cardiovascular exam Rhythm:regular Rate:Normal     Neuro/Psych negative neurological ROS     GI/Hepatic negative GI ROS, Neg liver ROS,   Endo/Other  negative endocrine ROSdiabetes  Renal/GU Renal diseaseRenal calculi     Musculoskeletal   Abdominal   Peds  Hematology negative hematology ROS (+)   Anesthesia Other Findings Does smoke, denies alcohol or other illicit drug use, no heart history, no heart stents, no stroke   Reproductive/Obstetrics negative OB ROS                           Anesthesia Physical Anesthesia Plan  ASA: II  Anesthesia Plan: General   Post-op Pain Management:    Induction: Intravenous  Airway Management Planned: Oral ETT  Additional Equipment:   Intra-op Plan:   Post-operative Plan: Extubation in OR  Informed Consent: I have reviewed the patients History and Physical, chart, labs and discussed the procedure including the risks, benefits and alternatives for the proposed anesthesia with the patient or authorized representative who has indicated his/her understanding and acceptance.   Dental Advisory Given  Plan Discussed with: Anesthesiologist, CRNA and Surgeon  Anesthesia Plan Comments:        Anesthesia Quick Evaluation

## 2015-10-06 NOTE — H&P (Signed)
Urology Admission H&P  Chief Complaint: Left flank pain  History of Present Illness: Trevor Crawford is a 65yo with a hx of DMII and HTN who presented to [facility] with a 4 day hx of severe left flank pain. The pain is sharp, intermittent, severe, nonradiaitng left flank pain. He has associated nausea. Nothing relieved the pain. His pain was refractory to narcotic pain medication int he ER. This is his first stone event CT scan shows 2 5-32mm left proximal ureteral calculi with moderate hydroureteronephrosis and perinephric stranding. WBC count normal.  Past Medical History  Diagnosis Date  . Diabetes mellitus without complication ([place])   . Hypertension    History reviewed. No pertinent past surgical history.  Home Medications:  Prescriptions prior to admission  Medication Sig Dispense Refill Last Dose  . aspirin EC 81 MG tablet Take 81 mg by mouth daily.   10/04/2015 at Unknown time  . atorvastatin (LIPITOR) 80 MG tablet Take 40 mg by mouth at bedtime.   10/04/2015 at Unknown time  . carboxymethylcellulose (REFRESH PLUS) 0.5 % SOLN Apply 1 drop to eye 4 (four) times daily.   10/05/2015 at Unknown time  . furosemide (LASIX) 40 MG tablet Take 40 mg by mouth every morning.   10/04/2015 at Unknown time  . glipiZIDE (GLUCOTROL) 10 MG tablet Take 20 mg by mouth 2 (two) times daily.   10/04/2015 at Unknown time  . Insulin Glargine (LANTUS SOLOSTAR) 100 UNIT/ML Solostar Pen Inject 15 Units into the skin daily.   10/04/2015 at Unknown time  . latanoprost (XALATAN) 0.005 % ophthalmic solution Place 1 drop into both eyes at bedtime.   10/04/2015 at Unknown time  . lisinopril (PRINIVIL,ZESTRIL) 40 MG tablet Take 40 mg by mouth daily.   10/04/2015 at Unknown time  . omeprazole (PRILOSEC) 20 MG capsule Take 20 mg by mouth daily.   10/04/2015 at Unknown time  . simethicone (MYLICON) 80 MG chewable tablet Chew 160 mg by mouth 2 (two) times daily as needed for flatulence.   10/04/2015 at Unknown time   Allergies: No Known  Allergies  Family History  Problem Relation Age of Onset  . Cancer Mother   . Alcoholism Father   . Cancer Other    Social History:  reports that he has been smoking Cigarettes.  He has been smoking about 1.00 pack per day. He has never used smokeless tobacco. He reports that he does not drink alcohol or use illicit drugs.  Review of Systems  Gastrointestinal: Positive for nausea and vomiting.  Genitourinary: Positive for urgency, frequency and flank pain.  All other systems reviewed and are negative.   Physical Exam:  Vital signs in last 24 hours: Temp:  [97.7 F (36.5 C)-98 F (36.7 C)] 97.7 F (36.5 C) (03/02 0600) Pulse Rate:  [61-76] 62 (03/02 0600) Resp:  [13-18] 18 (03/02 0600) BP: (125-188)/(62-106) 137/62 mmHg (03/02 0600) SpO2:  [97 %-100 %] 99 % (03/02 0600) Weight:  [87.3 kg (192 lb 7.4 oz)-89.812 kg (198 lb)] 87.3 kg (192 lb 7.4 oz) (03/02 0004) Physical Exam  Constitutional: He is oriented to person, place, and time. He appears well-developed and well-nourished.  HENT:  Head: Normocephalic and atraumatic.  Eyes: EOM are normal. Pupils are equal, round, and reactive to light.  Neck: Normal range of motion. No thyromegaly present.  Cardiovascular: Normal rate and regular rhythm.   Respiratory: Effort normal. No respiratory distress.  GI: Soft. He exhibits no distension and no mass. There is no tenderness. There is  no rebound and no guarding. Hernia confirmed negative in the right inguinal area and confirmed negative in the left inguinal area.  Genitourinary: Testes normal and penis normal. Right testis shows no mass, no swelling and no tenderness. Right testis is descended. Cremasteric reflex is not absent on the right side. Left testis shows no mass, no swelling and no tenderness. Left testis is descended. Cremasteric reflex is not absent on the left side. No penile tenderness.  Musculoskeletal: Normal range of motion.  Lymphadenopathy:       Right: No inguinal  adenopathy present.       Left: No inguinal adenopathy present.  Neurological: He is alert and oriented to person, place, and time.  Skin: Skin is warm and dry.  Psychiatric: He has a normal mood and affect. His behavior is normal. Judgment and thought content normal.    Laboratory Data:  Results for orders placed or performed during the hospital encounter of 10/05/15 (from the past 24 hour(s))  Lipase, blood     Status: None   Collection Time: 10/05/15  1:51 PM  Result Value Ref Range   Lipase 25 11 - 51 U/L  Comprehensive metabolic panel     Status: Abnormal   Collection Time: 10/05/15  1:51 PM  Result Value Ref Range   Sodium 142 135 - 145 mmol/L   Potassium 5.0 3.5 - 5.1 mmol/L   Chloride 107 101 - 111 mmol/L   CO2 26 22 - 32 mmol/L   Glucose, Bld 81 65 - 99 mg/dL   BUN 30 (H) 6 - 20 mg/dL   Creatinine, Ser 2.05 (H) 0.61 - 1.24 mg/dL   Calcium 9.4 8.9 - 10.3 mg/dL   Total Protein 7.5 6.5 - 8.1 g/dL   Albumin 3.9 3.5 - 5.0 g/dL   AST 20 15 - 41 U/L   ALT 22 17 - 63 U/L   Alkaline Phosphatase 92 38 - 126 U/L   Total Bilirubin 0.5 0.3 - 1.2 mg/dL   GFR calc non Af Amer 33 (L) >60 mL/min   GFR calc Af Amer 38 (L) >60 mL/min   Anion gap 9 5 - 15  CBC     Status: None   Collection Time: 10/05/15  1:51 PM  Result Value Ref Range   WBC 9.0 4.0 - 10.5 K/uL   RBC 4.45 4.22 - 5.81 MIL/uL   Hemoglobin 13.1 13.0 - 17.0 g/dL   HCT 39.8 39.0 - 52.0 %   MCV 89.4 78.0 - 100.0 fL   MCH 29.4 26.0 - 34.0 pg   MCHC 32.9 30.0 - 36.0 g/dL   RDW 14.3 11.5 - 15.5 %   Platelets 213 150 - 400 K/uL  Urinalysis, Routine w reflex microscopic (not at [facility])     Status: Abnormal   Collection Time: 10/05/15  3:05 PM  Result Value Ref Range   Color, Urine YELLOW YELLOW   APPearance CLEAR CLEAR   Specific Gravity, Urine 1.020 1.005 - 1.030   pH 6.5 5.0 - 8.0   Glucose, UA NEGATIVE NEGATIVE mg/dL   Hgb urine dipstick SMALL (A) NEGATIVE   Bilirubin Urine NEGATIVE NEGATIVE   Ketones, ur NEGATIVE  NEGATIVE mg/dL   Protein, ur 100 (A) NEGATIVE mg/dL   Nitrite NEGATIVE NEGATIVE   Leukocytes, UA NEGATIVE NEGATIVE  Urine microscopic-add on     Status: Abnormal   Collection Time: 10/05/15  3:05 PM  Result Value Ref Range   Squamous Epithelial / LPF 0-5 (A) NONE SEEN  WBC, UA 0-5 0 - 5 WBC/hpf   RBC / HPF 6-30 0 - 5 RBC/hpf   Bacteria, UA RARE (A) NONE SEEN   Sperm, UA PRESENT    No results found for this or any previous visit (from the past 240 hour(s)). Creatinine:  Recent Labs  10/05/15 1351  CREATININE 2.05*   Baseline Creatinine: unknown  Impression/Assessment:  64yo with left ureteral calculi, intractable flank pain  Plan:  1. The risks/benefits/alternatives to left ureteral stent placement was explained to the patient and he understands and wishes to proceed with surgery. 2. Pt will be admitted for pain control  Shanicka Oldenkamp L 10/06/2015, 8:59 AM

## 2015-10-06 NOTE — Anesthesia Postprocedure Evaluation (Signed)
Anesthesia Post Note  Patient: Trevor Crawford  Procedure(s) Performed: Procedure(s) (LRB): CYSTOSCOPY WITH RETROGRADE PYELOGRAM, AND LEFT STENT PLACEMENT  (Left)  Patient location during evaluation: PACU Anesthesia Type: General Level of consciousness: awake and alert Pain management: pain level controlled Vital Signs Assessment: post-procedure vital signs reviewed and stable Respiratory status: spontaneous breathing, nonlabored ventilation, respiratory function stable and patient connected to nasal cannula oxygen Cardiovascular status: blood pressure returned to baseline and stable Postop Assessment: no signs of nausea or vomiting Anesthetic complications: no    Last Vitals:  Filed Vitals:   10/06/15 1700 10/06/15 1715  BP: 165/80 168/75  Pulse: 59 58  Temp:  36.9 C  Resp: 15 16    Last Pain:  Filed Vitals:   10/06/15 1717  PainSc: 0-No pain                 Hezakiah Champeau A

## 2015-10-06 NOTE — Anesthesia Procedure Notes (Signed)
Procedure Name: Intubation Date/Time: 10/06/2015 4:05 PM Performed by: Maxwell Caul Pre-anesthesia Checklist: Patient identified, Emergency Drugs available, Suction available and Patient being monitored Patient Re-evaluated:Patient Re-evaluated prior to inductionOxygen Delivery Method: Circle System Utilized Preoxygenation: Pre-oxygenation with 100% oxygen Intubation Type: IV induction Ventilation: Mask ventilation without difficulty Laryngoscope Size: Mac and 4 Grade View: Grade I Tube type: Oral Tube size: 7.5 mm Number of attempts: 1 Airway Equipment and Method: Stylet Placement Confirmation: ETT inserted through vocal cords under direct vision,  positive ETCO2 and breath sounds checked- equal and bilateral Secured at: 21 cm Tube secured with: Tape Dental Injury: Teeth and Oropharynx as per pre-operative assessment

## 2015-10-06 NOTE — Progress Notes (Signed)
Patient's CBG-52, 50%-dextrose 34ml given while patient is on the way to OR, reported off the [place] to F/O with rechecking blood sugar.

## 2015-10-06 NOTE — Progress Notes (Signed)
Hypoglycemic Event  CBG:52  Treatment: D50 IV 25 mL  Symptoms: None  Follow-up CBG: Time: CBG Result:  Possible Reasons for Event: Inadequate meal intake and Other: NPO  Comments/MD notified:Dr. Alyson Ingles paged but no call back but OR informed because he is on his way to OR.    Leanora Ivanoff

## 2015-10-06 NOTE — Transfer of Care (Signed)
Immediate Anesthesia Transfer of Care Note  Patient: Trevor Crawford  Procedure(s) Performed: Procedure(s): CYSTOSCOPY WITH RETROGRADE PYELOGRAM, AND LEFT STENT PLACEMENT  (Left)  Patient Location: PACU  Anesthesia Type:General  Level of Consciousness:  sedated, patient cooperative and responds to stimulation  Airway & Oxygen Therapy:Patient Spontanous Breathing and Patient connected to face mask oxgen  Post-op Assessment:  Report given to PACU RN and Post -op Vital signs reviewed and stable  Post vital signs:  Reviewed and stable  Last Vitals:  Filed Vitals:   10/06/15 0600 10/06/15 1420  BP: 137/62 147/68  Pulse: 62 65  Temp: 36.5 C 36.5 C  Resp: 18 16    Complications: No apparent anesthesia complications

## 2015-10-06 NOTE — Brief Op Note (Signed)
10/05/2015 - 10/06/2015  4:53 PM  PATIENT:  Trevor Crawford  65 y.o. male  PRE-OPERATIVE DIAGNOSIS:  left ureteral stone  POST-OPERATIVE DIAGNOSIS:  left ureteral stone  PROCEDURE:  Procedure(s): CYSTOSCOPY WITH RETROGRADE PYELOGRAM, AND LEFT STENT PLACEMENT  (Left)  SURGEON:  Surgeon(s) and Role:    * Cleon Gustin, MD - Primary  PHYSICIAN ASSISTANT:   ASSISTANTS: none   ANESTHESIA:   general  EBL:  Total I/O In: 1626.7 [I.V.:1626.7] Out: 775 [Urine:775]  BLOOD ADMINISTERED:none  DRAINS: Left 6x26 JJ ureteral stent without tether  LOCAL MEDICATIONS USED:  NONE  SPECIMEN:  No Specimen  DISPOSITION OF SPECIMEN:  N/A  COUNTS:  YES  TOURNIQUET:  * No tourniquets in log *  DICTATION: .Note written in EPIC  PLAN OF CARE: Admit for overnight observation  PATIENT DISPOSITION:  PACU - hemodynamically stable.   Delay start of Pharmacological VTE agent (>24hrs) due to surgical blood loss or risk of bleeding: not applicable

## 2015-10-07 ENCOUNTER — Encounter (HOSPITAL_COMMUNITY): Payer: Self-pay | Admitting: Urology

## 2015-10-07 LAB — GLUCOSE, CAPILLARY: GLUCOSE-CAPILLARY: 283 mg/dL — AB (ref 65–99)

## 2015-10-07 MED ORDER — OXYCODONE HCL 5 MG PO TABS: 5.0000 mg | ORAL_TABLET | ORAL | Status: DC | PRN

## 2015-10-07 MED ORDER — TAMSULOSIN HCL 0.4 MG PO CAPS: 0.4000 mg | ORAL_CAPSULE | Freq: Every day | ORAL | Status: DC

## 2015-10-07 NOTE — Discharge Instructions (Signed)
Ureteral Stent Implantation, Care After °Refer to this sheet in the next few weeks. These instructions provide you with information on caring for yourself after your procedure. Your health care provider may also give you more specific instructions. Your treatment has been planned according to current medical practices, but problems sometimes occur. Call your health care provider if you have any problems or questions after your procedure. °WHAT TO EXPECT AFTER THE PROCEDURE °You should be back to normal activity within 48 hours after the procedure. Nausea and vomiting may occur and are commonly the result of anesthesia. °It is common to experience sharp pain in the back or lower abdomen and penis with voiding. This is caused by movement of the ends of the stent with the act of urinating. It usually goes away within minutes after you have stopped urinating. °HOME CARE INSTRUCTIONS °Make sure to drink plenty of fluids. You may have small amounts of bleeding, causing your urine to be red. This is normal. Certain movements may trigger pain or a feeling that you need to urinate. You may be given medicines to prevent infection or bladder spasms. Be sure to take all medicines as directed. Only take over-the-counter or prescription medicines for pain, discomfort, or fever as directed by your health care provider. Do not take aspirin, as this can make bleeding worse. °Your stent will be left in until the blockage is resolved. This may take 2 weeks or longer, depending on the reason for stent implantation. You may have an X-ray exam to make sure your ureter is open and that the stent has not moved out of position (migrated). The stent can be removed by your health care provider in the office. Medicines may be given for comfort while the stent is being removed. Be sure to keep all follow-up appointments so your health care provider can check that you are healing properly. °SEEK MEDICAL CARE IF: °· You experience increasing  pain. °· Your pain medicine is not working. °SEEK IMMEDIATE MEDICAL CARE IF: °· Your urine is dark red or has blood clots. °· You are leaking urine (incontinent). °· You have a fever, chills, feeling sick to your stomach (nausea), or vomiting. °· Your pain is not relieved by pain medicine. °· The end of the stent comes out of the urethra. °· You are unable to urinate. °  °This information is not intended to replace advice given to you by your health care provider. Make sure you discuss any questions you have with your health care provider. °  °Document Released: 03/25/2013 Document Revised: 07/28/2013 Document Reviewed: 02/04/2015 °Elsevier Interactive Patient Education ©2016 [employer] ° °

## 2015-10-07 NOTE — Progress Notes (Signed)
Patient discharged home with wife, discharge instructions given and explained to patient/wife and they verbalized understanding, denies any pain/distress. Skin intact, no wound noted. Accompanied home by wife, transported to the car by staff via wheelchair.

## 2015-10-08 NOTE — Op Note (Signed)
.  Preoperative diagnosis: Left ureteral stone  Postoperative diagnosis: Same  Procedure: 1 cystoscopy 2. Left retrograde pyelography 3.  Intraoperative fluoroscopy, under one hour, with interpretation 4. Left 6 x 26 JJ stent placement  Attending: Nicolette Bang  Anesthesia: General  Estimated blood loss: None  Drains: Left 6 x 26 JJ ureteral stent without tether  Specimens: none  Antibiotics: ancef  Findings: multiple left proximal ureteral calculi. Moderate hydronephrosis. No masses/lesions in the bladder. Ureteral orifices in normal anatomic location.  Indications: Patient is a 65 year old male with a history of left  Ureteral calculi and intractable pain.  After discussing treatment options, they decided proceed with left stent placement.  Procedure her in detail: The patient was brought to the operating room and a brief timeout was done to ensure correct patient, correct procedure, correct site.  General anesthesia was administered patient was placed in dorsal lithotomy position.  Their genitalia was then prepped and draped in usual sterile fashion.  A rigid 42 French cystoscope was passed in the urethra and the bladder.  Bladder was inspected free masses or lesions.  the ureteral orifices were in the normal orthotopic locations.  a 6 french ureteral catheter was then instilled into the left ureteral orifice.  a gentle retrograde was obtained and findings noted above.  we then placed a zip wire through the ureteral catheter and advanced up to the renal pelvis.    We then placed a 6 x 26 double-j ureteral stent over the original zip wire.  We then removed the wire and good coil was noted in the the renal pelvis under fluoroscopy and the bladder under direct vision.  A foley catheter was then placed. the bladder was then drained and this concluded the procedure which was well tolerated by patient.  Complications: None  Condition: Stable, extubated, transferred to PACU  Plan: Patient  is to be admitted for observation. He will have his stone extraction in 2 weeks.

## 2015-10-13 NOTE — Discharge Summary (Signed)
Physician Discharge Summary  Patient ID: Trevor Crawford MRN: [id number] DOB/AGE: 64-Oct-1952 64 y.o.  Admit date: 10/05/2015 Discharge date: 10/07/2015 Admission Diagnoses: Left ureteral calculus Discharge Diagnoses:  Active Problems:   Ureteral calculus, left   Renal calculi   Discharged Condition: good  Hospital Course: The patient was transferred from [facility] for left ureteral calculus and intractable pain. He was taken to the operating room on 10/06/2015 for stent placement. The patient tolerated the procedure well and was transferred to the floor on IV pain meds, IV fluid. On POD#1 the patient was transitioned to a regular diet, IVFs were discontinued, and the patient passed flatus. Prior to discharge the pt was tolerating a regular diet, pain was controlled on PO pain meds, they were ambulating without difficulty, and they had normal bowel function.   Consults: None  Significant Diagnostic Studies: none  Treatments: surgery: Left ureteral stent placement  Discharge Exam: Blood pressure 143/64, pulse 63, temperature 97.6 F (36.4 C), temperature source Oral, resp. rate 18, height 6' (1.829 m), weight 87.3 kg (192 lb 7.4 oz), SpO2 100 %. General appearance: alert, cooperative and appears stated age Head: Normocephalic, without obvious abnormality, atraumatic Eyes: conjunctivae/corneas clear. PERRL, EOM's intact. Fundi benign. Neck: no adenopathy, no carotid bruit, no JVD, supple, symmetrical, trachea midline and thyroid not enlarged, symmetric, no tenderness/mass/nodules Resp: clear to auscultation bilaterally Cardio: regular rate and rhythm, S1, S2 normal, no murmur, click, rub or gallop GI: soft, non-tender; bowel sounds normal; no masses,  no organomegaly Neurologic: Alert and oriented X 3, normal strength and tone. Normal symmetric reflexes. Normal coordination and gait  Disposition: 01-Home or Self Care     Medication List    TAKE these medications        aspirin EC  81 MG tablet  Take 81 mg by mouth daily.     atorvastatin 80 MG tablet  Commonly known as:  LIPITOR  Take 40 mg by mouth at bedtime.     brimonidine 0.2 % ophthalmic solution  Commonly known as:  ALPHAGAN  Place 1 drop into both eyes 2 (two) times daily.     carboxymethylcellulose 0.5 % Soln  Commonly known as:  REFRESH PLUS  Apply 1 drop to eye 4 (four) times daily.     dorzolamide-timolol 22.3-6.8 MG/ML ophthalmic solution  Commonly known as:  COSOPT  1 drop 2 (two) times daily.     furosemide 40 MG tablet  Commonly known as:  LASIX  Take 40 mg by mouth every morning.     glipiZIDE 10 MG tablet  Commonly known as:  GLUCOTROL  Take 20 mg by mouth 2 (two) times daily.     LANTUS SOLOSTAR 100 UNIT/ML Solostar Pen  Generic drug:  Insulin Glargine  Inject 15 Units into the skin daily.     latanoprost 0.005 % ophthalmic solution  Commonly known as:  XALATAN  Place 1 drop into both eyes at bedtime.     lisinopril 40 MG tablet  Commonly known as:  PRINIVIL,ZESTRIL  Take 40 mg by mouth daily.     omeprazole 20 MG capsule  Commonly known as:  PRILOSEC  Take 20 mg by mouth daily.     oxyCODONE 5 MG immediate release tablet  Commonly known as:  Oxy IR/ROXICODONE  Take 1 tablet (5 mg total) by mouth every 4 (four) hours as needed for moderate pain.     simethicone 80 MG chewable tablet  Commonly known as:  MYLICON  Chew 0000000 mg by  mouth 2 (two) times daily as needed for flatulence.     tamsulosin 0.4 MG Caps capsule  Commonly known as:  FLOMAX  Take 1 capsule (0.4 mg total) by mouth daily after supper.           Follow-up Information    Follow up with Cleon Gustin, MD. Call on 10/19/2015.   Specialty:  Urology   Why:  for surgery   Contact information:   [address]  [place] [address] [phone]       Signed: Cleon Gustin 10/13/2015, 11:10 AM

## 2015-10-18 ENCOUNTER — Other Ambulatory Visit: Payer: Self-pay

## 2015-10-18 DIAGNOSIS — N201 Calculus of ureter: Secondary | ICD-10-CM

## 2015-10-19 NOTE — Patient Instructions (Signed)
Bari Doorley Hadden  10/19/2015     @PREFPERIOPPHARMACY @   Your procedure is scheduled on  10/26/2015   Report to [facility] at  1100  A.M.  Call this number if you have problems the morning of surgery:  [phone]   Remember:  Do not eat food or drink liquids after midnight.  Take these medicines the morning of surgery with A SIP OF WATER  Lisinopril, prilosec, oxycodone, flomax. Take 1/2 of your usual insulin dose the night before your surgery.   Do not wear jewelry, make-up or nail polish.  Do not wear lotions, powders, or perfumes.  You may wear deodorant.  Do not shave 48 hours prior to surgery.  Men may shave face and neck.  Do not bring valuables to the hospital.  [facility] is not responsible for any belongings or valuables.  Contacts, dentures or bridgework may not be worn into surgery.  Leave your suitcase in the car.  After surgery it may be brought to your room.  For patients admitted to the hospital, discharge time will be determined by your treatment team.  Patients discharged the day of surgery will not be allowed to drive home.   Name and phone number of your driver:   family Special instructions:  none  Please read over the following fact sheets that you were given. Coughing and Deep Breathing, Surgical Site Infection Prevention, Anesthesia Post-op Instructions and Care and Recovery After Surgery      Ureteral Stent Implantation Ureteral stent implantation is the implantation of a soft plastic tube with multiple holes into the tube that drains urine from your kidney to your bladder (ureter). The stent helps drain your kidney when there is a blockage of the flow of urine in your ureter. The stent has a coil on each end to keep it from falling out. One end stays in the kidney. The other end stays in the bladder. It is most often taken out after any blockage has been removed or your ureter has healed. Short-term stents have a string attached to  make removal quite easy. Removal of a short-term stent can be done in your health care provider's office or by you at home. Long-term stents need to be changed every few months. LET [facility] CARE PROVIDER KNOW ABOUT:  Any allergies you have.  All medicines you are taking, including vitamins, herbs, eye drops, creams, and over-the-counter medicines.  Previous problems you or members of your family have had with the use of anesthetics.  Any blood disorders you have.  Previous surgeries you have had.  Medical conditions you have. RISKS AND COMPLICATIONS Generally, ureteral stent implantation is a safe procedure. However, as with any procedure, complications can occur. Possible complications include:  Movement of the stent away from where it was originally placed (migration). This may affect the ability of the stent to properly drain your kidney. If migration of the stent occurs, the stent may need to be replaced or repositioned.  Perforation of the ureter.  Infection. BEFORE THE PROCEDURE  You may be asked to wash your genital area with sterile soap the morning of your procedure.  You may be given an oral antibiotic which you should take with a sip of water as prescribed by your health care provider.  You may be asked to not eat or drink for 8 hours before the surgery. PROCEDURE  First you will be given an anesthetic so you  do not feel pain during the procedure.  Your health care provider will insert a special lighted instrument called a cystoscope into your bladder. This allows your health care provider to see the opening to your ureter.  A thin wire is carefully threaded into your bladder and up the ureter. The stent is inserted over the wire and the wire is then removed.  Your bladder will be emptied of urine. AFTER THE PROCEDURE You will be taken to a recovery room until it is okay for you to go home.   This information is not intended to replace advice given to you by  your health care provider. Make sure you discuss any questions you have with your health care provider.   Document Released: 07/20/2000 Document Revised: 07/28/2013 Document Reviewed: 02/04/2015 Elsevier Interactive Patient Education 2016 [employer] Ureteral Stent Implantation, Care After Refer to this sheet in the next few weeks. These instructions provide you with information on caring for yourself after your procedure. Your health care provider may also give you more specific instructions. Your treatment has been planned according to current medical practices, but problems sometimes occur. Call your health care provider if you have any problems or questions after your procedure. WHAT TO EXPECT AFTER THE PROCEDURE You should be back to normal activity within 48 hours after the procedure. Nausea and vomiting may occur and are commonly the result of anesthesia. It is common to experience sharp pain in the back or lower abdomen and penis with voiding. This is caused by movement of the ends of the stent with the act of urinating.It usually goes away within minutes after you have stopped urinating. HOME CARE INSTRUCTIONS Make sure to drink plenty of fluids. You may have small amounts of bleeding, causing your urine to be red. This is normal. Certain movements may trigger pain or a feeling that you need to urinate. You may be given medicines to prevent infection or bladder spasms. Be sure to take all medicines as directed. Only take over-the-counter or prescription medicines for pain, discomfort, or fever as directed by your health care provider. Do not take aspirin, as this can make bleeding worse. Your stent will be left in until the blockage is resolved. This may take 2 weeks or longer, depending on the reason for stent implantation. You may have an X-ray exam to make sure your ureter is open and that the stent has not moved out of position (migrated). The stent can be removed by your health care  provider in the office. Medicines may be given for comfort while the stent is being removed. Be sure to keep all follow-up appointments so your health care provider can check that you are healing properly. SEEK MEDICAL CARE IF:  You experience increasing pain.  Your pain medicine is not working. SEEK IMMEDIATE MEDICAL CARE IF:  Your urine is dark red or has blood clots.  You are leaking urine (incontinent).  You have a fever, chills, feeling sick to your stomach (nausea), or vomiting.  Your pain is not relieved by pain medicine.  The end of the stent comes out of the urethra.  You are unable to urinate.   This information is not intended to replace advice given to you by your health care provider. Make sure you discuss any questions you have with your health care provider.   Document Released: 03/25/2013 Document Revised: 07/28/2013 Document Reviewed: 02/04/2015 Elsevier Interactive Patient Education [employer]. Cystoscopy Cystoscopy is a procedure that is used to help your  caregiver diagnose and sometimes treat conditions that affect your lower urinary tract. Your lower urinary tract includes your bladder and the tube through which urine passes from your bladder out of your body (urethra). Cystoscopy is performed with a thin, tube-shaped instrument (cystoscope). The cystoscope has lenses and a light at the end so that your caregiver can see inside your bladder. The cystoscope is inserted at the entrance of your urethra. Your caregiver guides it through your urethra and into your bladder. There are two main types of cystoscopy:  Flexible cystoscopy (with a flexible cystoscope).  Rigid cystoscopy (with a rigid cystoscope). Cystoscopy may be recommended for many conditions, including:  Urinary tract infections.  Blood in your urine (hematuria).  Loss of bladder control (urinary incontinence) or overactive bladder.  Unusual cells found in a urine sample.  Urinary  blockage.  Painful urination. Cystoscopy may also be done to remove a sample of your tissue to be checked under a microscope (biopsy). It may also be done to remove or destroy bladder stones. LET YOUR CAREGIVER KNOW ABOUT:  Allergies to food or medicine.  Medicines taken, including vitamins, herbs, eyedrops, over-the-counter medicines, and creams.  Use of steroids (by mouth or creams).  Previous problems with anesthetics or numbing medicines.  History of bleeding problems or blood clots.  Previous surgery.  Other health problems, including diabetes and kidney problems.  Possibility of pregnancy, if this applies. PROCEDURE The area around the opening to your urethra will be cleaned. A medicine to numb your urethra (local anesthetic) is used. If a tissue sample or stone is removed during the procedure, you may be given a medicine to make you sleep (general anesthetic). Your caregiver will gently insert the tip of the cystoscope into your urethra. The cystoscope will be slowly glided through your urethra and into your bladder. Sterile fluid will flow through the cystoscope and into your bladder. The fluid will expand and stretch your bladder. This gives your caregiver a better view of your bladder walls. The procedure lasts about 15-20 minutes. AFTER THE PROCEDURE If a local anesthetic is used, you will be allowed to go home as soon as you are ready. If a general anesthetic is used, you will be taken to a recovery area until you are stable. You may have temporary bleeding and burning on urination.   This information is not intended to replace advice given to you by your health care provider. Make sure you discuss any questions you have with your health care provider.   Document Released: 07/20/2000 Document Revised: 08/13/2014 Document Reviewed: 01/14/2012 Elsevier Interactive Patient Education 2016 [place]. Cystoscopy, Care After Refer to this sheet in the next few weeks. These  instructions provide you with information on caring for yourself after your procedure. Your caregiver may also give you more specific instructions. Your treatment has been planned according to current medical practices, but problems sometimes occur. Call your caregiver if you have any problems or questions after your procedure. HOME CARE INSTRUCTIONS  Things you can do to ease any discomfort after your procedure include:  Drinking enough water and fluids to keep your urine clear or pale yellow.  Taking a warm bath to relieve any burning feelings. SEEK IMMEDIATE MEDICAL CARE IF:   You have an increase in blood in your urine.  You notice blood clots in your urine.  You have difficulty passing urine.  You have the chills.  You have abdominal pain.  You have a fever or persistent symptoms for more than  2-3 days.  You have a fever and your symptoms suddenly get worse. MAKE SURE YOU:   Understand these instructions.  Will watch your condition.  Will get help right away if you are not doing well or get worse.   This information is not intended to replace advice given to you by your health care provider. Make sure you discuss any questions you have with your health care provider.   Document Released: 02/09/2005 Document Revised: 08/13/2014 Document Reviewed: 01/14/2012 Elsevier Interactive Patient Education 2016 [employer] PATIENT INSTRUCTIONS POST-ANESTHESIA  IMMEDIATELY FOLLOWING SURGERY:  Do not drive or operate machinery for the first twenty four hours after surgery.  Do not make any important decisions for twenty four hours after surgery or while taking narcotic pain medications or sedatives.  If you develop intractable nausea and vomiting or a severe headache please notify your doctor immediately.  FOLLOW-UP:  Please make an appointment with your surgeon as instructed. You do not need to follow up with anesthesia unless specifically instructed to do so.  WOUND CARE  INSTRUCTIONS (if applicable):  Keep a dry clean dressing on the anesthesia/puncture wound site if there is drainage.  Once the wound has quit draining you may leave it open to air.  Generally you should leave the bandage intact for twenty four hours unless there is drainage.  If the epidural site drains for more than 36-48 hours please call the anesthesia department.  QUESTIONS?:  Please feel free to call your physician or the hospital operator if you have any questions, and they will be happy to assist you.

## 2015-10-21 ENCOUNTER — Encounter (HOSPITAL_COMMUNITY): Payer: Self-pay

## 2015-10-21 ENCOUNTER — Encounter (HOSPITAL_COMMUNITY)
Admission: RE | Admit: 2015-10-21 | Discharge: 2015-10-21 | Disposition: A | Discharge status: Home / Self Care | Payer: Non-veteran care | Source: Ambulatory Visit | Attending: Urology | Admitting: Urology

## 2015-10-21 ENCOUNTER — Telehealth: Payer: Self-pay | Admitting: Urology

## 2015-10-21 HISTORY — DX: Polyneuropathy, unspecified: G62.9

## 2015-10-21 HISTORY — DX: Unspecified glaucoma: H40.9

## 2015-10-21 HISTORY — DX: Gastro-esophageal reflux disease without esophagitis: K21.9

## 2015-10-21 HISTORY — DX: Chronic kidney disease, unspecified: N18.9

## 2015-10-21 NOTE — Telephone Encounter (Signed)
Pt advised of pre op date/time and sx date. Sx: 10/26/15 @ 12:30 pm with Dr Alyson Ingles @ [facility]. Pre op: 10/21/15 @ 10:00 am  Patient has been advised that because the patient does not have a referral or authorization from the [place] at this time, he will be considered Self Pay. Patient verbalized understanding. He stated that he will call the [facility] at this time.

## 2015-10-21 NOTE — Pre-Procedure Instructions (Signed)
Patient given information to sign up for my chart at home. 

## 2015-10-26 ENCOUNTER — Encounter (HOSPITAL_COMMUNITY): Admission: RE | Payer: Self-pay | Source: Ambulatory Visit

## 2015-10-26 ENCOUNTER — Ambulatory Visit (HOSPITAL_COMMUNITY): Admission: RE | Admit: 2015-10-26 | Payer: Non-veteran care | Source: Ambulatory Visit | Admitting: Urology

## 2015-10-26 SURGERY — CYSTOSCOPY, WITH RETROGRADE PYELOGRAM
Anesthesia: General

## 2015-11-07 ENCOUNTER — Emergency Department (HOSPITAL_COMMUNITY): Payer: Non-veteran care

## 2015-11-07 ENCOUNTER — Encounter (HOSPITAL_COMMUNITY): Payer: Self-pay | Admitting: Emergency Medicine

## 2015-11-07 ENCOUNTER — Emergency Department (HOSPITAL_COMMUNITY)
Admission: EM | Admit: 2015-11-07 | Discharge: 2015-11-07 | Disposition: A | Discharge status: Home / Self Care | Payer: Non-veteran care | Attending: Emergency Medicine | Admitting: Emergency Medicine

## 2015-11-07 DIAGNOSIS — R339 Retention of urine, unspecified: Secondary | ICD-10-CM | POA: Insufficient documentation

## 2015-11-07 DIAGNOSIS — Z7982 Long term (current) use of aspirin: Secondary | ICD-10-CM | POA: Insufficient documentation

## 2015-11-07 DIAGNOSIS — M549 Dorsalgia, unspecified: Secondary | ICD-10-CM | POA: Diagnosis not present

## 2015-11-07 DIAGNOSIS — R109 Unspecified abdominal pain: Secondary | ICD-10-CM | POA: Insufficient documentation

## 2015-11-07 DIAGNOSIS — Z7984 Long term (current) use of oral hypoglycemic drugs: Secondary | ICD-10-CM | POA: Insufficient documentation

## 2015-11-07 DIAGNOSIS — Z794 Long term (current) use of insulin: Secondary | ICD-10-CM | POA: Insufficient documentation

## 2015-11-07 DIAGNOSIS — R300 Dysuria: Secondary | ICD-10-CM | POA: Diagnosis present

## 2015-11-07 DIAGNOSIS — N189 Chronic kidney disease, unspecified: Secondary | ICD-10-CM | POA: Diagnosis not present

## 2015-11-07 DIAGNOSIS — F17210 Nicotine dependence, cigarettes, uncomplicated: Secondary | ICD-10-CM | POA: Diagnosis not present

## 2015-11-07 DIAGNOSIS — Z79899 Other long term (current) drug therapy: Secondary | ICD-10-CM | POA: Diagnosis not present

## 2015-11-07 DIAGNOSIS — I129 Hypertensive chronic kidney disease with stage 1 through stage 4 chronic kidney disease, or unspecified chronic kidney disease: Secondary | ICD-10-CM | POA: Insufficient documentation

## 2015-11-07 DIAGNOSIS — E1122 Type 2 diabetes mellitus with diabetic chronic kidney disease: Secondary | ICD-10-CM | POA: Diagnosis not present

## 2015-11-07 DIAGNOSIS — E1140 Type 2 diabetes mellitus with diabetic neuropathy, unspecified: Secondary | ICD-10-CM | POA: Diagnosis not present

## 2015-11-07 DIAGNOSIS — R509 Fever, unspecified: Secondary | ICD-10-CM | POA: Diagnosis not present

## 2015-11-07 DIAGNOSIS — R110 Nausea: Secondary | ICD-10-CM | POA: Insufficient documentation

## 2015-11-07 LAB — I-STAT CG4 LACTIC ACID, ED: Lactic Acid, Venous: 0.85 mmol/L (ref 0.5–2.0)

## 2015-11-07 LAB — CBC WITH DIFFERENTIAL/PLATELET
BASOS ABS: 0.1 10*3/uL (ref 0.0–0.1)
Basophils Relative: 1 %
EOS PCT: 2 %
Eosinophils Absolute: 0.2 10*3/uL (ref 0.0–0.7)
HCT: 33.3 % — ABNORMAL LOW (ref 39.0–52.0)
Hemoglobin: 11.3 g/dL — ABNORMAL LOW (ref 13.0–17.0)
LYMPHS PCT: 18 %
Lymphs Abs: 1.5 10*3/uL (ref 0.7–4.0)
MCH: 29.2 pg (ref 26.0–34.0)
MCHC: 33.9 g/dL (ref 30.0–36.0)
MCV: 86 fL (ref 78.0–100.0)
MONO ABS: 1.2 10*3/uL — AB (ref 0.1–1.0)
Monocytes Relative: 14 %
Neutro Abs: 5.7 10*3/uL (ref 1.7–7.7)
Neutrophils Relative %: 67 %
PLATELETS: 246 10*3/uL (ref 150–400)
RBC: 3.87 MIL/uL — ABNORMAL LOW (ref 4.22–5.81)
RDW: 13.1 % (ref 11.5–15.5)
WBC: 8.6 10*3/uL (ref 4.0–10.5)

## 2015-11-07 LAB — URINALYSIS, ROUTINE W REFLEX MICROSCOPIC
Bilirubin Urine: NEGATIVE
GLUCOSE, UA: 100 mg/dL — AB
KETONES UR: 15 mg/dL — AB
Leukocytes, UA: NEGATIVE
Nitrite: NEGATIVE
PROTEIN: 100 mg/dL — AB
Specific Gravity, Urine: 1.025 (ref 1.005–1.030)
pH: 6 (ref 5.0–8.0)

## 2015-11-07 LAB — BASIC METABOLIC PANEL
ANION GAP: 8 (ref 5–15)
BUN: 37 mg/dL — AB (ref 6–20)
CHLORIDE: 107 mmol/L (ref 101–111)
CO2: 23 mmol/L (ref 22–32)
Calcium: 8.8 mg/dL — ABNORMAL LOW (ref 8.9–10.3)
Creatinine, Ser: 1.73 mg/dL — ABNORMAL HIGH (ref 0.61–1.24)
GFR calc Af Amer: 47 mL/min — ABNORMAL LOW (ref >60)
GFR calc non Af Amer: 40 mL/min — ABNORMAL LOW (ref >60)
GLUCOSE: 249 mg/dL — AB (ref 65–99)
POTASSIUM: 4.4 mmol/L (ref 3.5–5.1)
Sodium: 138 mmol/L (ref 135–145)

## 2015-11-07 LAB — URINE MICROSCOPIC-ADD ON

## 2015-11-07 MED ORDER — TAMSULOSIN HCL 0.4 MG PO CAPS: 0.4000 mg | ORAL_CAPSULE | Freq: Every day | ORAL | Status: DC

## 2015-11-07 MED ORDER — CIPROFLOXACIN HCL 250 MG PO TABS: 250.0000 mg | ORAL_TABLET | Freq: Two times a day (BID) | ORAL | Status: DC

## 2015-11-07 MED ORDER — HYDROCODONE-ACETAMINOPHEN 5-325 MG PO TABS
2.0000 | ORAL_TABLET | Freq: Once | ORAL | Status: AC
  Administered 2015-11-07: 2 via ORAL
  Filled 2015-11-07: qty 2

## 2015-11-07 MED ORDER — SODIUM CHLORIDE 0.9 % IV BOLUS (SEPSIS)
1000.0000 mL | Freq: Once | INTRAVENOUS | Status: AC
  Administered 2015-11-07: 1000 mL via INTRAVENOUS

## 2015-11-07 NOTE — ED Notes (Signed)
Pt voided about 200 ml of urine, post void bladder scan revealed 420 ml urine.

## 2015-11-07 NOTE — ED Notes (Signed)
Pt reports painful urination, malodorous urine, fever and chills.

## 2015-11-07 NOTE — Discharge Instructions (Signed)
Acute Urinary Retention, Male  Follow-up in 2 days with Dr. Alyson Ingles. Take antibiotics as prescribed. Return to the ED with worsening pain, fever, vomiting or any other concerns. Acute urinary retention is the temporary inability to urinate. This is a common problem in older men. As men age their prostates become larger and block the flow of urine from the bladder. This is usually a problem that has come on gradually.  HOME CARE INSTRUCTIONS If you are sent home with a Foley catheter and a drainage system, you will need to discuss the best course of action with your health care provider. While the catheter is in, maintain a good intake of fluids. Keep the drainage bag emptied and lower than your catheter. This is so that contaminated urine will not flow back into your bladder, which could lead to a urinary tract infection. There are two main types of drainage bags. One is a large bag that usually is used at night. It has a good capacity that will allow you to sleep through the night without having to empty it. The second type is called a leg bag. It has a smaller capacity, so it needs to be emptied more frequently. However, the main advantage is that it can be attached by a leg strap and can go underneath your clothing, allowing you the freedom to move about or leave your home. Only take over-the-counter or prescription medicines for pain, discomfort, or fever as directed by your health care provider.  SEEK MEDICAL CARE IF:  You develop a low-grade fever.  You experience spasms or leakage of urine with the spasms. SEEK IMMEDIATE MEDICAL CARE IF:   You develop chills or fever.  Your catheter stops draining urine.  Your catheter falls out.  You start to develop increased bleeding that does not respond to rest and increased fluid intake. MAKE SURE YOU:  Understand these instructions.  Will watch your condition.  Will get help right away if you are not doing well or get worse.   This  information is not intended to replace advice given to you by your health care provider. Make sure you discuss any questions you have with your health care provider.   Document Released: 10/29/2000 Document Revised: 12/07/2014 Document Reviewed: 01/01/2013 Elsevier Interactive Patient Education [employer].

## 2015-11-07 NOTE — ED Notes (Signed)
Bladder scan 378 ml 

## 2015-11-07 NOTE — ED Provider Notes (Signed)
CSN: [id number]     Arrival date & time 11/07/15  0859 History   First MD Initiated Contact with Patient 11/07/15 [phone]     Chief Complaint  Patient presents with  . Dysuria     (Consider location/radiation/quality/duration/timing/severity/associated sxs/prior Treatment) HPI Comments: Patient is a poor historian. States he's had left-sided flank pain for "months". He underwent a ureteral stent placement on March 2 for kidney stones. He still has a stent in place. He followed up with the [facility] but was told that the stent is to stay in place for possibly 3 months. He comes in today with worsening flank pain, chills, fever, dysuria, urgency, frequency and difficulty controlling his urine. He is concerned that he has a UTI. He is wearing a diaper because he states he is dribbling constantly. He states he had a fever to 102 3 days ago. He said nausea but no vomiting. No testicular pain. No abdominal pain or chest pain. He is not on antibiotics currently.  The history is provided by the patient and the spouse.    Past Medical History  Diagnosis Date  . Diabetes mellitus without complication ([place])   . Hypertension   . Glaucoma   . GERD (gastroesophageal reflux disease)   . Chronic kidney disease   . Neuropathy [facility])    Past Surgical History  Procedure Laterality Date  . Cystoscopy with retrograde pyelogram, ureteroscopy and stent placement Left 10/06/2015    Procedure: CYSTOSCOPY WITH RETROGRADE PYELOGRAM, AND LEFT STENT PLACEMENT ;  Surgeon: Cleon Gustin, MD;  Location: [facility];  Service: Urology;  Laterality: Left;   Family History  Problem Relation Age of Onset  . Cancer Mother   . Alcoholism Father   . Cancer Other    Social History  Substance Use Topics  . Smoking status: Current Every Day Smoker -- 1.00 packs/day for 55 years    Types: Cigarettes  . Smokeless tobacco: Never Used  . Alcohol Use: No    Review of Systems  Constitutional: Positive for fever, chills, activity  change and appetite change.  Respiratory: Negative for cough, chest tightness and shortness of breath.   Gastrointestinal: Positive for nausea. Negative for vomiting and abdominal pain.  Genitourinary: Positive for dysuria, urgency, frequency, hematuria, flank pain and difficulty urinating. Negative for discharge, scrotal swelling and testicular pain.  Musculoskeletal: Positive for back pain. Negative for myalgias and arthralgias.  Skin: Negative for wound.  Neurological: Negative for dizziness, weakness and headaches.  A complete 10 system review of systems was obtained and all systems are negative except as noted in the HPI and PMH.      Allergies  Review of patient's allergies indicates no known allergies.  Home Medications   Prior to Admission medications   Medication Sig Start Date End Date Taking? Authorizing Provider  aspirin EC 81 MG tablet Take 81 mg by mouth daily.   Yes Historical Provider, MD  atorvastatin (LIPITOR) 80 MG tablet Take 40 mg by mouth at bedtime.   Yes Historical Provider, MD  brimonidine (ALPHAGAN) 0.2 % ophthalmic solution Place 1 drop into both eyes 2 (two) times daily.   Yes Historical Provider, MD  carboxymethylcellulose (REFRESH PLUS) 0.5 % SOLN Apply 1 drop to eye 4 (four) times daily.   Yes Historical Provider, MD  dorzolamide-timolol (COSOPT) 22.3-6.8 MG/ML ophthalmic solution 1 drop 2 (two) times daily.   Yes Historical Provider, MD  furosemide (LASIX) 40 MG tablet Take 40 mg by mouth every morning.   Yes Historical Provider,  MD  glipiZIDE (GLUCOTROL) 10 MG tablet Take 20 mg by mouth 2 (two) times daily.   Yes Historical Provider, MD  Insulin Glargine (LANTUS SOLOSTAR) 100 UNIT/ML Solostar Pen Inject 15 Units into the skin daily.   Yes Historical Provider, MD  latanoprost (XALATAN) 0.005 % ophthalmic solution Place 1 drop into both eyes at bedtime.   Yes Historical Provider, MD  lisinopril (PRINIVIL,ZESTRIL) 40 MG tablet Take 40 mg by mouth daily.   Yes  Historical Provider, MD  omeprazole (PRILOSEC) 20 MG capsule Take 20 mg by mouth daily.   Yes Historical Provider, MD  simethicone (MYLICON) 80 MG chewable tablet Chew 160 mg by mouth 2 (two) times daily as needed for flatulence.   Yes Historical Provider, MD  oxyCODONE (OXY IR/ROXICODONE) 5 MG immediate release tablet Take 1 tablet (5 mg total) by mouth every 4 (four) hours as needed for moderate pain. Patient not taking: Reported on 11/07/2015 10/07/15   Cleon Gustin, MD  tamsulosin (FLOMAX) 0.4 MG CAPS capsule Take 1 capsule (0.4 mg total) by mouth daily after supper. Patient not taking: Reported on 11/07/2015 10/07/15   Cleon Gustin, MD   BP 162/78 mmHg  Pulse 62  Temp(Src) 97.8 F (36.6 C)  Resp 18  Ht 6' (1.829 m)  Wt 204 lb (92.534 kg)  BMI 27.66 kg/m2  SpO2 95% Physical Exam  Constitutional: He is oriented to person, place, and time. He appears well-developed and well-nourished. No distress.  HENT:  Head: Normocephalic and atraumatic.  Mouth/Throat: Oropharynx is clear and moist. No oropharyngeal exudate.  Eyes: Conjunctivae and EOM are normal. Pupils are equal, round, and reactive to light.  Neck: Normal range of motion. Neck supple.  No meningismus.  Cardiovascular: Normal rate, regular rhythm, normal heart sounds and intact distal pulses.   No murmur heard. Pulmonary/Chest: Effort normal and breath sounds normal. No respiratory distress. He exhibits no tenderness.  Abdominal: Soft. There is no tenderness. There is no rebound and no guarding.  Genitourinary:  No testicular tenderness. Uncircumsized. No external stent visible  Musculoskeletal: Normal range of motion. He exhibits tenderness. He exhibits no edema.  L CVAT  Neurological: He is alert and oriented to person, place, and time. No cranial nerve deficit. He exhibits normal muscle tone. Coordination normal.  No ataxia on finger to nose bilaterally. No pronator drift. 5/5 strength throughout. CN 2-12 intact.Equal  grip strength. Sensation intact.   Skin: Skin is warm.  Psychiatric: He has a normal mood and affect. His behavior is normal.  Nursing note and vitals reviewed.   ED Course  Procedures (including critical care time) Labs Review Labs Reviewed  URINALYSIS, ROUTINE W REFLEX MICROSCOPIC (NOT AT [facility]) - Abnormal; Notable for the following:    Glucose, UA 100 (*)    Hgb urine dipstick LARGE (*)    Ketones, ur 15 (*)    Protein, ur 100 (*)    All other components within normal limits  CBC WITH DIFFERENTIAL/PLATELET - Abnormal; Notable for the following:    RBC 3.87 (*)    Hemoglobin 11.3 (*)    HCT 33.3 (*)    Monocytes Absolute 1.2 (*)    All other components within normal limits  BASIC METABOLIC PANEL - Abnormal; Notable for the following:    Glucose, Bld 249 (*)    BUN 37 (*)    Creatinine, Ser 1.73 (*)    Calcium 8.8 (*)    GFR calc non Af Amer 40 (*)    GFR calc  Af Amer 47 (*)    All other components within normal limits  URINE MICROSCOPIC-ADD ON - Abnormal; Notable for the following:    Squamous Epithelial / LPF 0-5 (*)    Bacteria, UA FEW (*)    All other components within normal limits  CULTURE, BLOOD (ROUTINE X 2)  CULTURE, BLOOD (ROUTINE X 2)  URINE CULTURE  I-STAT CG4 LACTIC ACID, ED    Imaging Review Mr Lumbar Spine Wo Contrast  11/07/2015  CLINICAL DATA:  Urinary retention EXAM: MRI LUMBAR SPINE WITHOUT CONTRAST TECHNIQUE: Multiplanar, multisequence MR imaging of the lumbar spine was performed. No intravenous contrast was administered. COMPARISON:  None FINDINGS: Normal alignment. Straightening of the lumbar lordosis. Negative for fracture or mass. Conus medullaris normal and terminates at L1-2. No mass lesion or compression of the conus. Urinary bladder mildly distended. L1-2:  Negative L2-3:  Negative L3-4: Mild disc bulging and mild facet degeneration. Mild spinal stenosis. Neural foramina adequately patent L4-5: Diffuse bulging of the disc. Bilateral facet  hypertrophy with mild spinal stenosis. Mild foraminal narrowing bilaterally L5-S1: Small left-sided disc protrusion. This is compressing the left S1 nerve root. No significant spinal stenosis. IMPRESSION: Mild spinal stenosis at L3-4 L4-5. No significant spinal stenosis. Conus medullaris normal Small left-sided disc protrusion L5-S1 with compression left S1 nerve root. Electronically Signed   By: Franchot Gallo M.D.   On: 11/07/2015 12:30   Ct Renal Stone Study  11/07/2015  CLINICAL DATA:  Left flank pain extending across the back for 2 days. Painful urination. Fever and chills. EXAM: CT ABDOMEN AND PELVIS WITHOUT CONTRAST TECHNIQUE: Multidetector CT imaging of the abdomen and pelvis was performed following the standard protocol without IV contrast. COMPARISON:  10/05/2015 FINDINGS: There is suggestion of mild bronchiectasis in the lower lobes, incompletely evaluated. No pleural effusion or consolidation. The liver, gallbladder, spleen, adrenal glands, and pancreas have an unremarkable unenhanced appearance. There is no biliary dilatation. A left ureteral stent has been placed and extends from the renal pelvis to the bladder. There is no significant residual hydronephrosis. A few tiny calculi are present in the lower pole of the left kidney. The dominant 1.2 cm calculus on the prior study is no longer present. A small curvilinear calcification in the left renal hilum superior to the stent may be a vascular calcification. No definite calculi are identified adjacent to the stent in the ureter. Left-sided perinephric stranding has significantly decreased. There is mild diffuse left periureteral stranding. No perinephric fluid collection is seen. No right-sided calculi or hydronephrosis is seen. There is no evidence of bowel obstruction. Colonic diverticulosis is noted without evidence of diverticulitis. The appendix is unremarkable. Moderate atherosclerotic calcification is noted of the abdominal aorta and its major  branch vessels. The bladder is unremarkable. No free fluid or enlarged lymph nodes are identified. No acute osseous abnormality is identified. IMPRESSION: Interval left ureteral stent placement. A few tiny remaining calculi in the left lower pole without significant residual hydronephrosis. Decreased perinephric stranding. Electronically Signed   By: Logan Bores M.D.   On: 11/07/2015 10:21   I have personally reviewed and evaluated these images and lab results as part of my medical decision-making.   EKG Interpretation None      MDM   Final diagnoses:  Flank pain  Urinary retention   Acute and chronic flank pain with known kidney stones and stents in place. Reports fever 3 days ago.  UA with hematuria. No gross infection. Cr improving.  PVR 400 ml.  D/w Dr. Jeffie Pollock.  He agrees that CT findings are improving. There is no severe hydronephrosis or perinephric stranding. Patient's urinalysis is not consistent with infection but given his report of fever will cover with antibiotics. Foley catheter placed for urinary retention. Afebrile in the ED.  Normal WBC and lactate.  MRI shows no evidence of cord compression.  Dr. Jeffie Pollock wishes patient to follow up in 2 days with Dr. Noah Delaine in the [place] office. Patient instructed that he needs to follow-up with urology regarding his indwelling ureteral stent and should not go back to the [place].  Return precautions discussed including worsening pain, vomiting, fever or any other concerns.  Ezequiel Essex, MD 11/07/15 1730

## 2015-11-08 LAB — URINE CULTURE: Culture: NO GROWTH

## 2015-11-12 LAB — CULTURE, BLOOD (ROUTINE X 2)
CULTURE: NO GROWTH
CULTURE: NO GROWTH

## 2016-05-07 ENCOUNTER — Ambulatory Visit (INDEPENDENT_AMBULATORY_CARE_PROVIDER_SITE_OTHER): Payer: Medicare Other | Admitting: Family Medicine

## 2016-05-07 ENCOUNTER — Encounter: Payer: Self-pay | Admitting: Family Medicine

## 2016-05-07 DIAGNOSIS — Z23 Encounter for immunization: Secondary | ICD-10-CM | POA: Diagnosis not present

## 2016-05-07 DIAGNOSIS — Z794 Long term (current) use of insulin: Secondary | ICD-10-CM

## 2016-05-07 DIAGNOSIS — I10 Essential (primary) hypertension: Secondary | ICD-10-CM | POA: Diagnosis not present

## 2016-05-07 DIAGNOSIS — R3914 Feeling of incomplete bladder emptying: Secondary | ICD-10-CM

## 2016-05-07 DIAGNOSIS — E119 Type 2 diabetes mellitus without complications: Secondary | ICD-10-CM | POA: Diagnosis not present

## 2016-05-07 DIAGNOSIS — E1142 Type 2 diabetes mellitus with diabetic polyneuropathy: Secondary | ICD-10-CM

## 2016-05-07 DIAGNOSIS — H547 Unspecified visual loss: Secondary | ICD-10-CM | POA: Diagnosis not present

## 2016-05-07 DIAGNOSIS — F329 Major depressive disorder, single episode, unspecified: Secondary | ICD-10-CM | POA: Diagnosis not present

## 2016-05-07 DIAGNOSIS — N400 Enlarged prostate without lower urinary tract symptoms: Secondary | ICD-10-CM | POA: Insufficient documentation

## 2016-05-07 DIAGNOSIS — N401 Enlarged prostate with lower urinary tract symptoms: Secondary | ICD-10-CM | POA: Diagnosis not present

## 2016-05-07 DIAGNOSIS — F32A Depression, unspecified: Secondary | ICD-10-CM

## 2016-05-07 MED ORDER — BUPROPION HCL ER (SR) 150 MG PO TB12: 150.0000 mg | ORAL_TABLET | Freq: Two times a day (BID) | ORAL | 2 refills | Status: DC

## 2016-05-07 NOTE — Patient Instructions (Signed)
Great to meet you!  I have started you on wellbutrin, start with 1 pill once a day for 3 days then increase to 1 pill twice daily.   Come back in 3-4 weeks for follow up  You will be contacted by home health to arrange an evaluation.

## 2016-05-07 NOTE — Progress Notes (Signed)
HPI  Patient presents today here to establish care and requesting home health.  Patient is established at a [facility], he plans to continue going there for his medical care.  He would like an order for home health, his wife is with him and explained that she often cannot cater to all of his needs because she's not home due to work, or she is ill herself.  diabetes Takes 20 units of Lantus once daily, he takes 20 mg of glipizide twice daily. He reports hypoglycemia to the 40s and 50s approximately 1 or 2 times a month. He has symptoms of shakiness, sweating, and recovers with sugar sweetened foods. He is to continue going to the [place] for primary care, he is not wanting me to change his medications today.  He takes Lasix for chronic kidney disease.  He has complete right eye blindness, he states that his other eye has only severely blurred vision and he is legally blind. He is not allowed to drive a car. He cannot see the numbers on his Lantus pen to give himself injections.    PMH: Smoking status noted- current smoker F medical history significant for but not limited to-chronic kidney disease, depression, diabetes, GERD, hypertension, hyperlipidemia, legally blind, neuropathy Surgical history positive for stent placement in the ureter Lives with his wife, denies alcohol and drug use  ROS: Per HPI  Objective: BP 118/74   Pulse 62   Temp (!) 96.8 F (36 C) (Oral)   Ht 6' 2.2" (1.885 m)   Wt 208 lb 6.4 oz (94.5 kg)   BMI 26.62 kg/m  Gen: NAD, alert, cooperative with exam HEENT: NCAT, clouded cornea on the right, left pupil is reactive, poor dentition CV: RRR, good S1/S2, no murmur Resp: CTABL, no wheezes, non-labored Abd: SNTND, BS present, no guarding or organomegaly Ext: No edema, warm Neuro: Alert and oriented, strength 5/5 and sensation intact in bilateral lower extremities  Assessment and plan:  # Type 2 diabetes Reports last A1c was 7.82 months ago at the [place]. Continue  current medications, I would normally decrease his glipizide dose due to hypoglycemia, however patient does not want me to adjust his medications at this time. I have discussed with him that it's a much higher dose than I would normally recommend and to discuss this with his [facility] doctor.   # Depression New onset, Newly realized Denies suicidal thoughts Major complaint is decreased energy Denies anxiety, start Wellbutrin 3-4 weeks follow up  # Hypertension Well-controlled on ACE inhibitor, Lasix Labs, reports history of CKD  # Blindness Given his impact on ADLs I have written him a face-to-face order for home health for skilled nursing assessment  BPH Reports previous history of renal stone and then subsequent incomplete bladder emptying needed after this done resolved, he continues to be on Flomax  Preventive healthcare-flu shot given today, counseling provided      Orders Placed This Encounter  Procedures  . Flu Vaccine QUAD 36+ mos IM  . CMP14+EGFR  . Home Health    Order Specific Question:   To provide the following care/treatments    Answer:   RN    Order Specific Question:   To provide the following care/treatments    Answer:   [place]  . Face-to-face encounter (required for Medicare/Medicaid patients)    I [place] certify that this patient is under my care and that I, or a nurse practitioner or physician's assistant working with me, had a face-to-face encounter that  meets the physician face-to-face encounter requirements with this patient on 05/07/2016. The encounter with the patient was in whole, or in part for the following medical condition(s) which is the primary reason for home health care (List medical condition):Type 2 diabetes, blindness, hypertension, depression    Order Specific Question:   The encounter with the patient was in whole, or in part, for the following medical condition, which is the primary reason for home health care    Answer:   Type  2 diabetes, blindness, hypertension, depression    Order Specific Question:   I certify that, based on my findings, the following services are medically necessary home health services    Answer:   Nursing    Order Specific Question:   Reason for Medically Necessary Home Health Services    Answer:   Skilled Nursing- Skilled Assessment/Observation    Order Specific Question:   My clinical findings support the need for the above services    Answer:   Unable to leave home safely without assistance and/or assistive device    Order Specific Question:   Further, I certify that my clinical findings support that this patient is homebound due to:    Answer:   Unable to leave home safely without assistance    Meds ordered this encounter  Medications  . buPROPion (WELLBUTRIN SR) 150 MG 12 hr tablet    Sig: Take 1 tablet (150 mg total) by mouth 2 (two) times daily.    Dispense:  60 tablet    Refill:  [place], MD [facility] 05/07/2016, 1:51 PM

## 2016-05-08 LAB — CMP14+EGFR
A/G RATIO: 1.3 (ref 1.2–2.2)
ALK PHOS: 108 IU/L (ref 39–117)
ALT: 19 IU/L (ref 0–44)
AST: 19 IU/L (ref 0–40)
Albumin: 4.2 g/dL (ref 3.6–4.8)
BILIRUBIN TOTAL: 0.2 mg/dL (ref 0.0–1.2)
BUN/Creatinine Ratio: 16 (ref 10–24)
BUN: 38 mg/dL — ABNORMAL HIGH (ref 8–27)
CALCIUM: 9.6 mg/dL (ref 8.6–10.2)
CHLORIDE: 103 mmol/L (ref 96–106)
CO2: 23 mmol/L (ref 18–29)
Creatinine, Ser: 2.38 mg/dL — ABNORMAL HIGH (ref 0.76–1.27)
GFR calc Af Amer: 32 mL/min/{1.73_m2} — ABNORMAL LOW (ref >59)
GFR, EST NON AFRICAN AMERICAN: 28 mL/min/{1.73_m2} — AB (ref >59)
Globulin, Total: 3.2 g/dL (ref 1.5–4.5)
Glucose: 180 mg/dL — ABNORMAL HIGH (ref 65–99)
POTASSIUM: 5.1 mmol/L (ref 3.5–5.2)
Sodium: 141 mmol/L (ref 134–144)
Total Protein: 7.4 g/dL (ref 6.0–8.5)

## 2016-05-10 ENCOUNTER — Other Ambulatory Visit: Payer: Self-pay | Admitting: Family Medicine

## 2016-05-10 DIAGNOSIS — E1122 Type 2 diabetes mellitus with diabetic chronic kidney disease: Secondary | ICD-10-CM | POA: Insufficient documentation

## 2016-05-10 DIAGNOSIS — N184 Chronic kidney disease, stage 4 (severe): Principal | ICD-10-CM

## 2016-05-10 NOTE — Telephone Encounter (Signed)
Referral for nephro written  Laroy Apple, MD [facility] 05/10/2016, 7:48 AM

## 2016-08-01 IMAGING — MR MR LUMBAR SPINE W/O CM
4 of 5 series · 13 of 48 positions shown · non-contrast
Comparison: None

CLINICAL DATA: Urinary retention

EXAM:
MRI LUMBAR SPINE WITHOUT CONTRAST
TECHNIQUE: Multiplanar, multisequence MR imaging of the lumbar spine was
performed. No intravenous contrast was administered.

[Series 3: T2 · sagittal · 4.0mm · 0.52mm/px · 4 of 13 slices shown (1 of 3)]
[im 1/13]
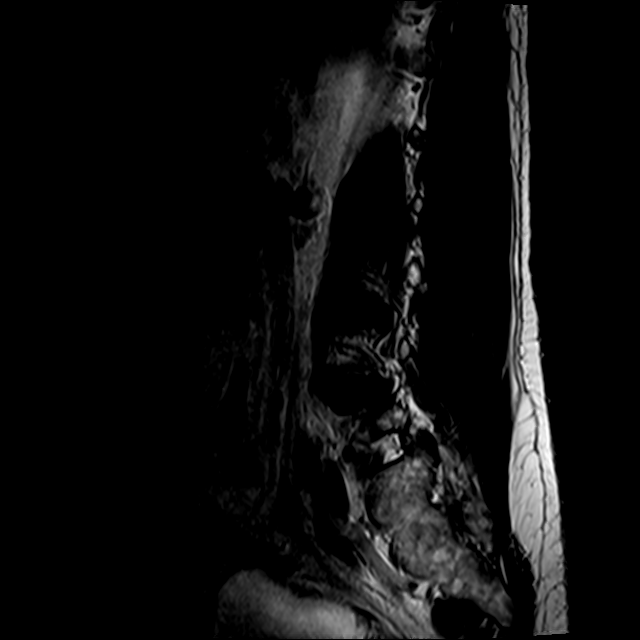
[im 4/13]
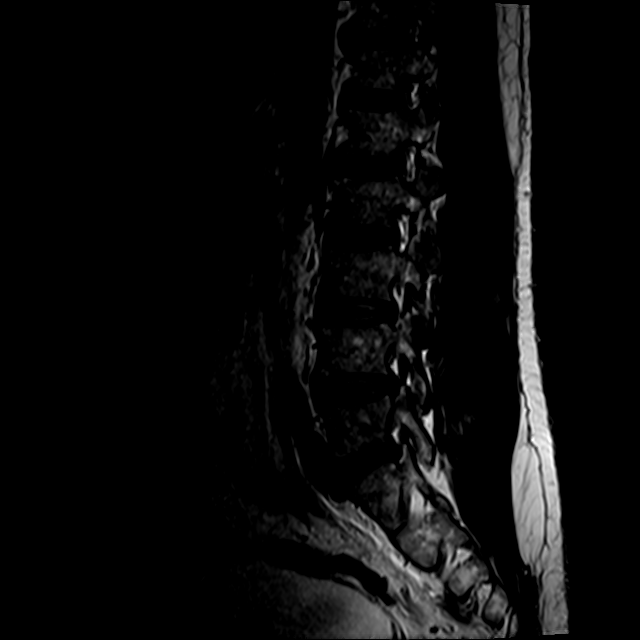
[im 7/13]
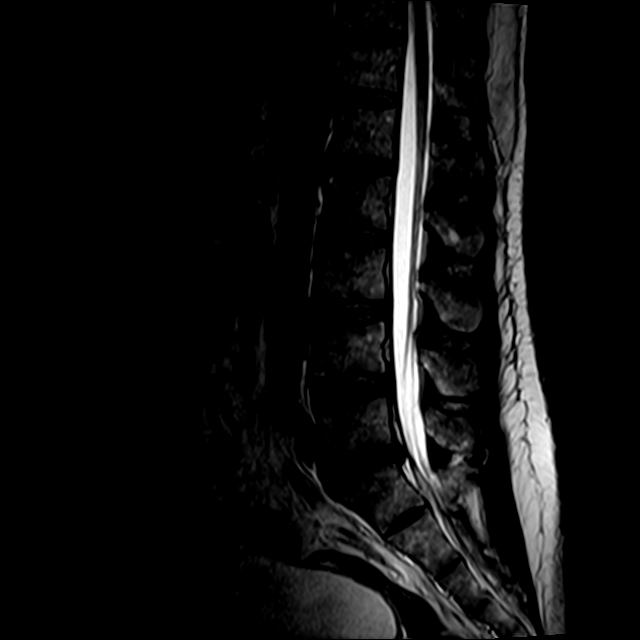
[im 13/13]
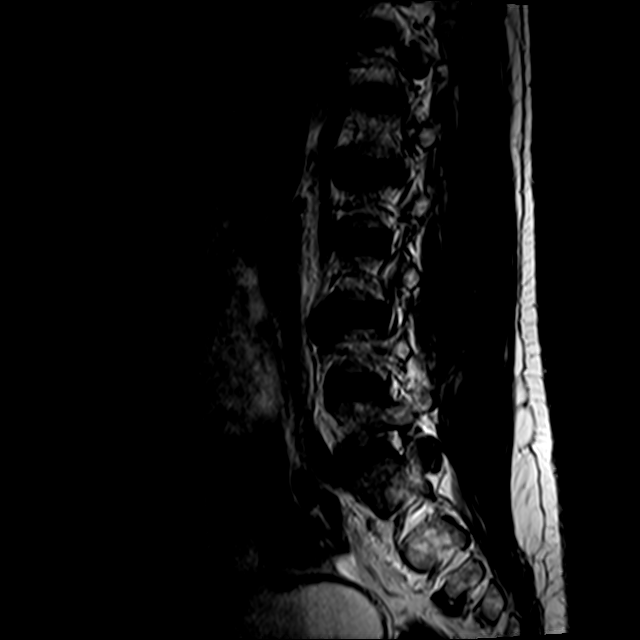

[Series 4: T1 · sagittal · 4.0mm · 0.52mm/px · 3 of 13 slices shown]
[im 1/13]
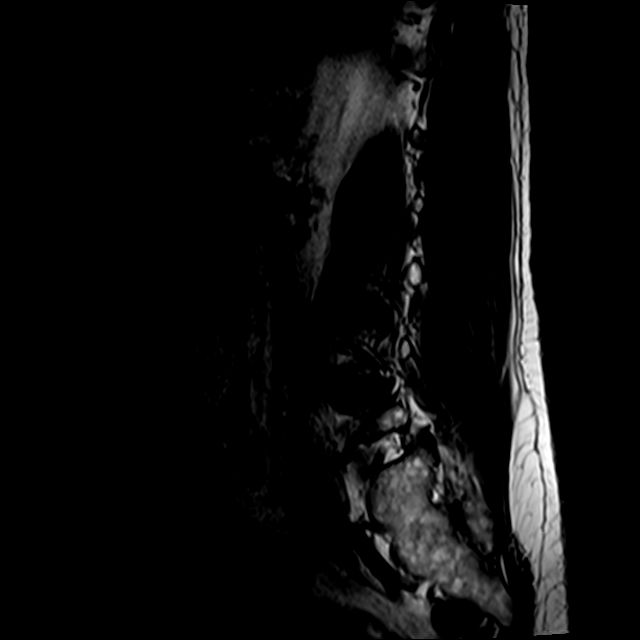
[im 7/13]
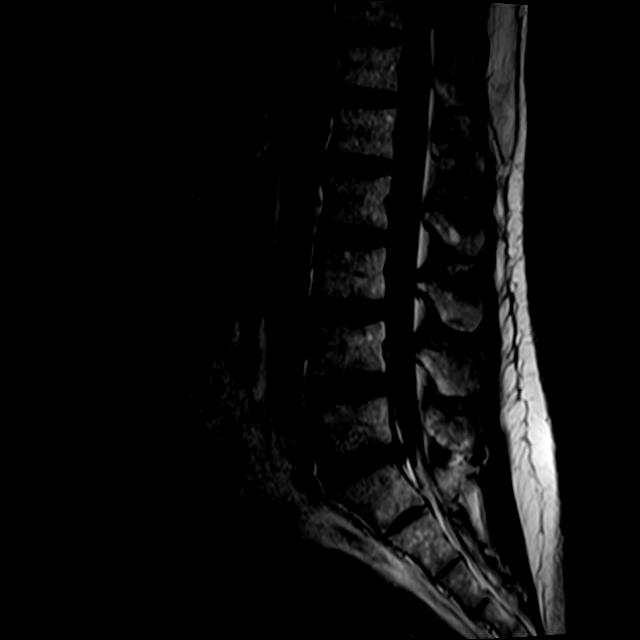
[im 13/13]
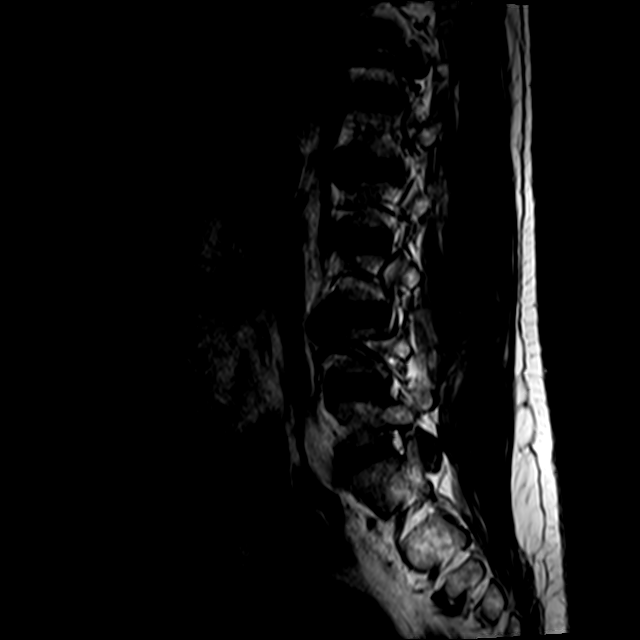

[Series 5: T2 · sagittal · 4.0mm · 0.52mm/px · 3 of 20 slices shown (2 of 3)]
[im 3/20]
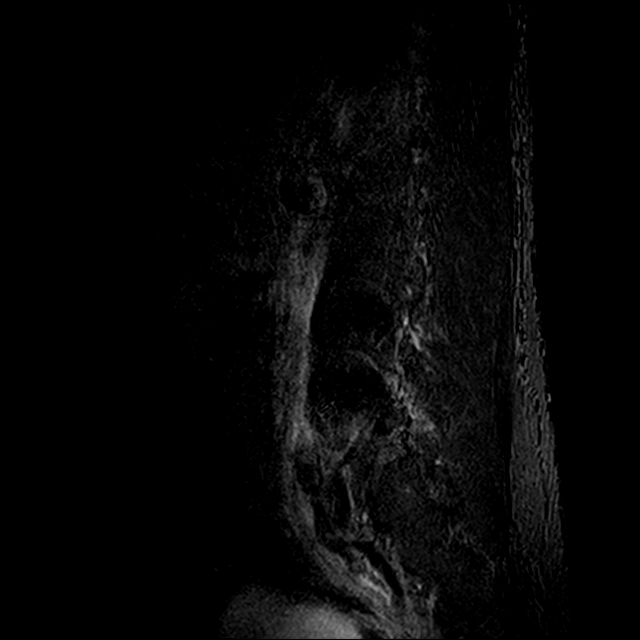
[im 11/20]
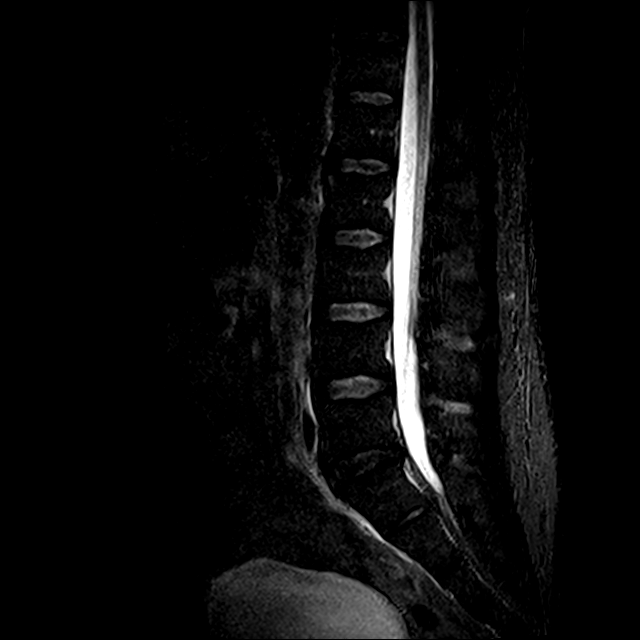
[im 17/20]
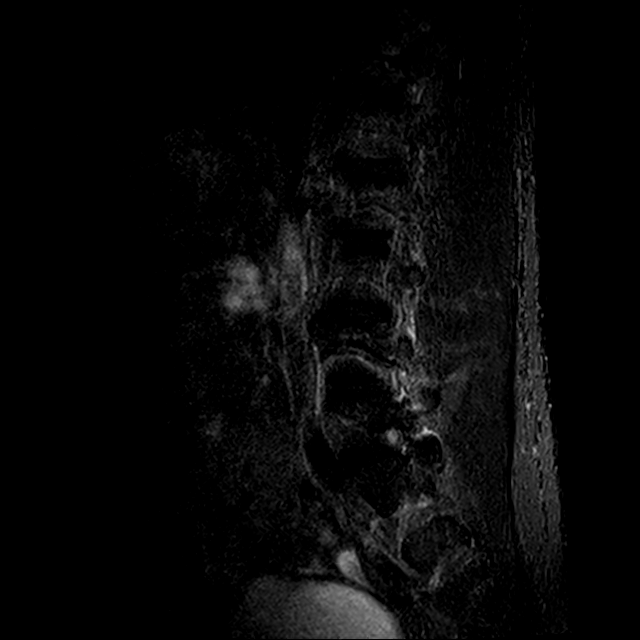

[Series 6: T2 · axial · 4.0mm · 0.25mm/px · z∈[-114,+22]mm · 3 of 38 slices shown (3 of 3)]
[im 6/38]
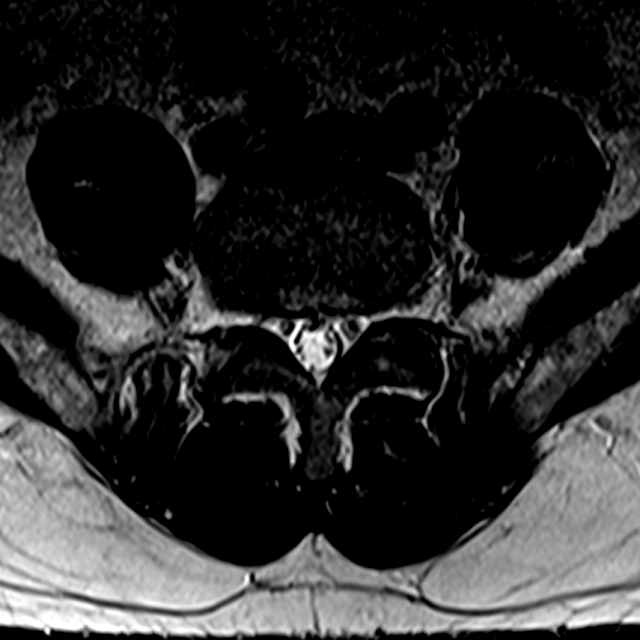
[im 19/38]
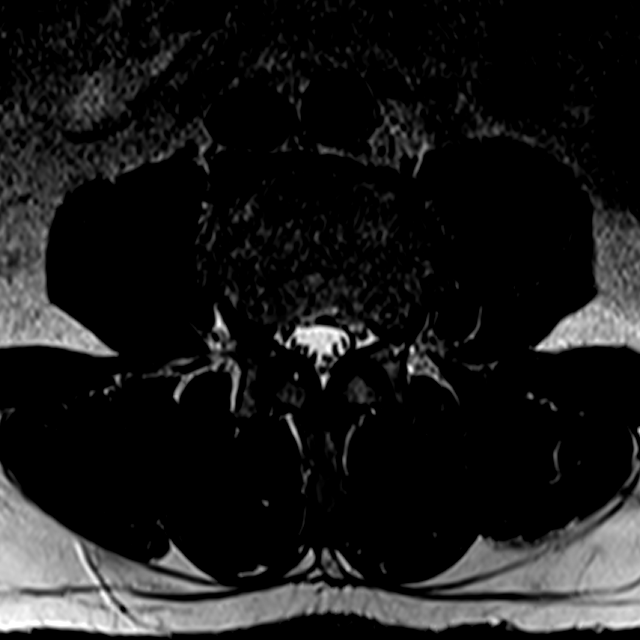
[im 32/38]
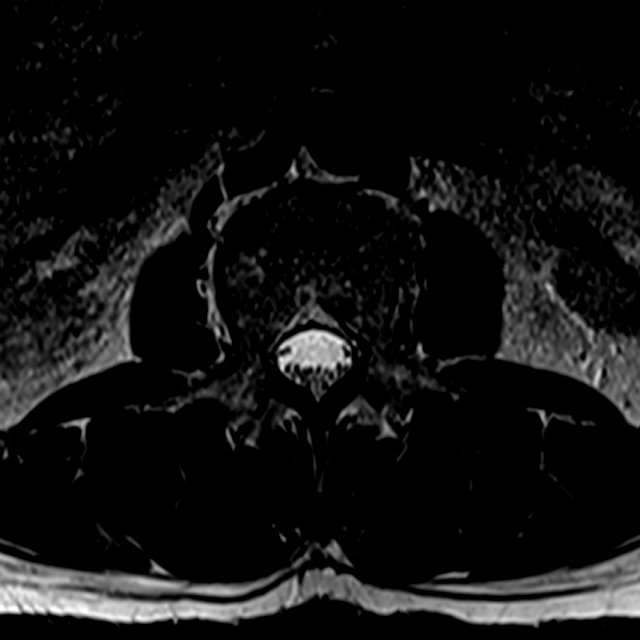

[13 of 48 positions shown; findings below may reference images not displayed]

FINDINGS: Normal alignment. Straightening of the lumbar lordosis. Negative for
fracture or mass.

Conus medullaris normal and terminates at L1-2. No mass lesion or
compression of the conus.

Urinary bladder mildly distended.

L1-2:  Negative

L2-3:  Negative

L3-4: Mild disc bulging and mild facet degeneration. Mild spinal
stenosis. Neural foramina adequately patent

L4-5: Diffuse bulging of the disc. Bilateral facet hypertrophy with
mild spinal stenosis. Mild foraminal narrowing bilaterally

L5-S1: Small left-sided disc protrusion. This is compressing the
left S1 nerve root. No significant spinal stenosis.
IMPRESSION: Mild spinal stenosis at L3-4 L4-5. No significant spinal stenosis.
Conus medullaris normal

Small left-sided disc protrusion L5-S1 with compression left S1
nerve root.

## 2016-08-01 IMAGING — CT CT RENAL STONE PROTOCOL
2 of 4 series · 16 of 46 positions shown, 18 images · non-contrast
Comparison: 10/05/2015

CLINICAL DATA: Left flank pain extending across the back for 2
days. Painful urination. Fever and chills.

EXAM:
CT ABDOMEN AND PELVIS WITHOUT CONTRAST
TECHNIQUE: Multidetector CT imaging of the abdomen and pelvis was performed
following the standard protocol without IV contrast.

[Series 2: standard/full over (age)lbs 5.0 · axial · 0.73mm/px · z∈[-529,-89]mm · 13 of 97 slices shown, 15 images]
[im 5/97  soft-tissue]
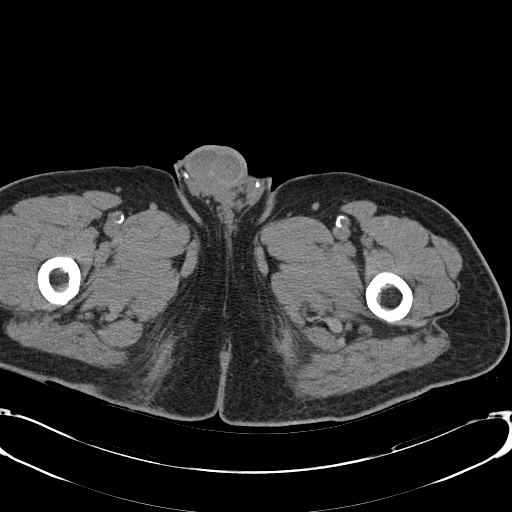
[im 5/97  bone]
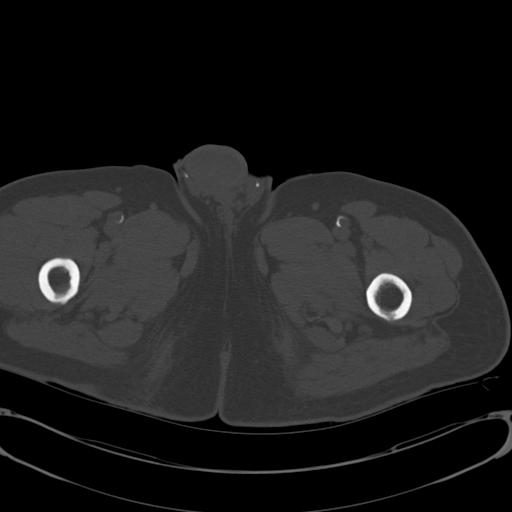
[im 13/97  soft-tissue]
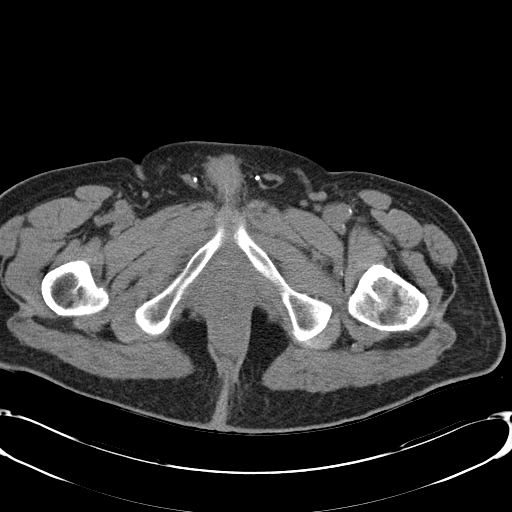
[im 21/97  soft-tissue]
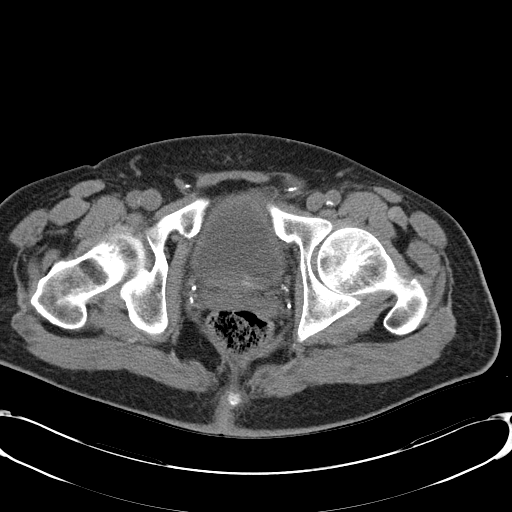
[im 29/97  soft-tissue]
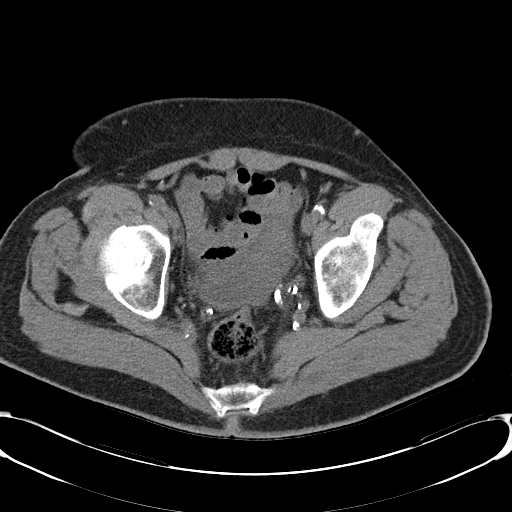
[im 33/97  soft-tissue]
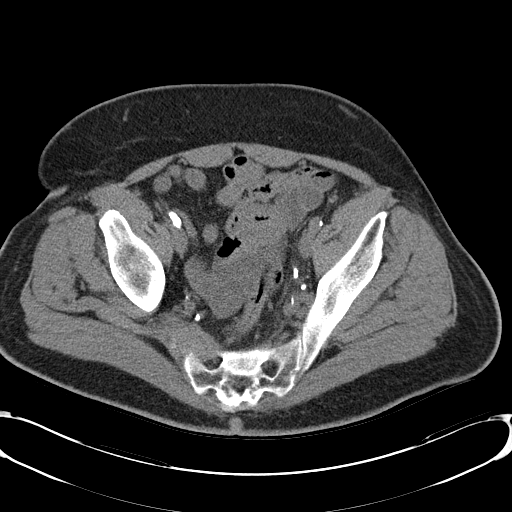
[im 41/97  soft-tissue]
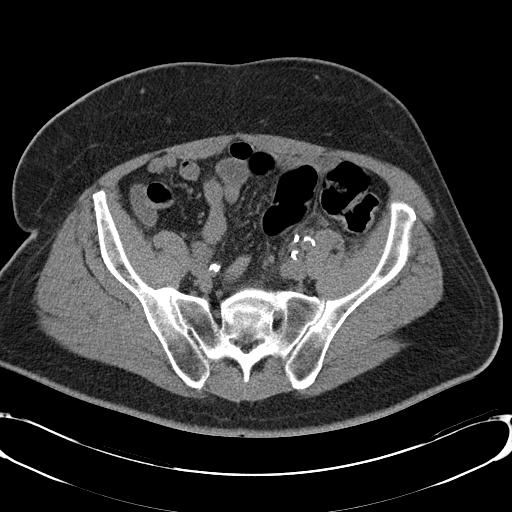
[im 49/97  soft-tissue]
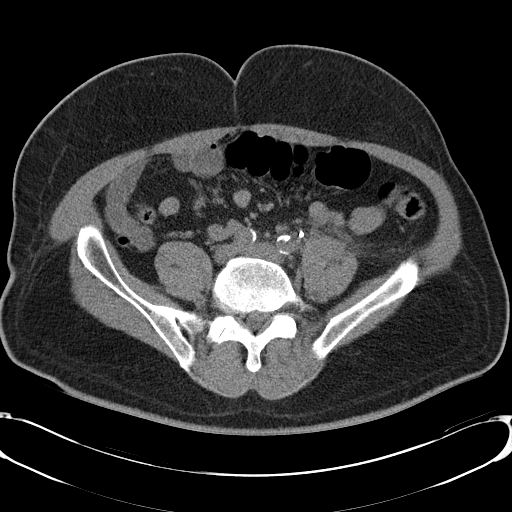
[im 57/97  soft-tissue]
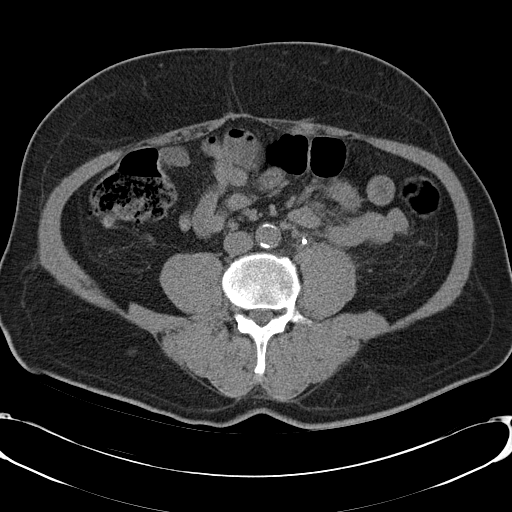
[im 65/97  soft-tissue]
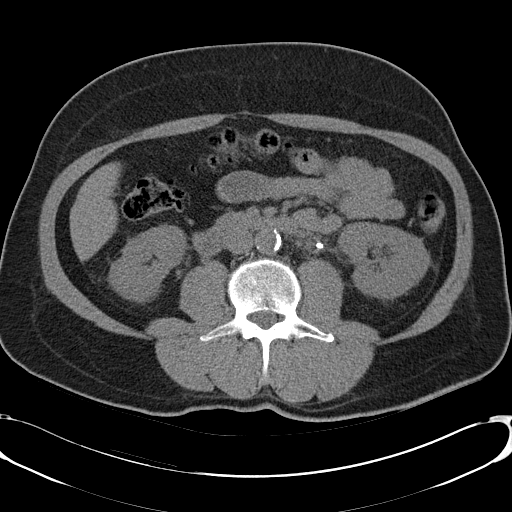
[im 65/97  bone]
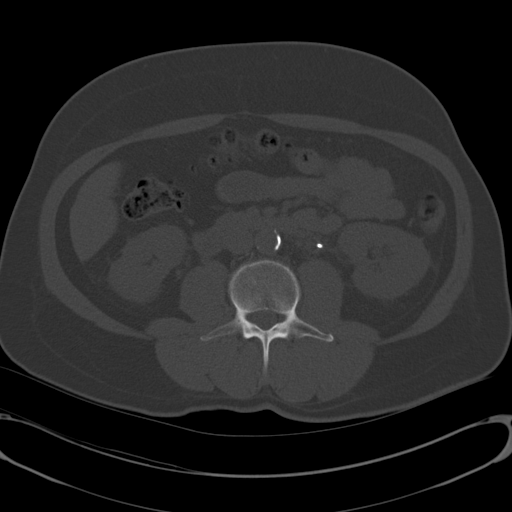
[im 69/97  soft-tissue]
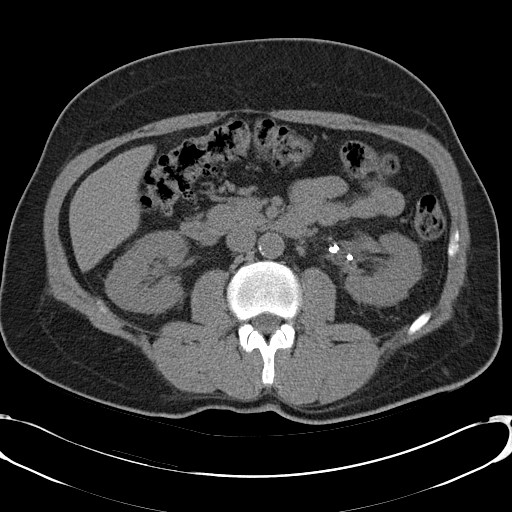
[im 77/97  soft-tissue]
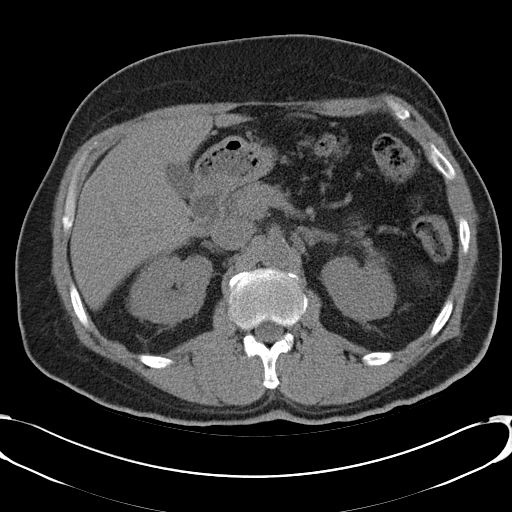
[im 85/97  soft-tissue]
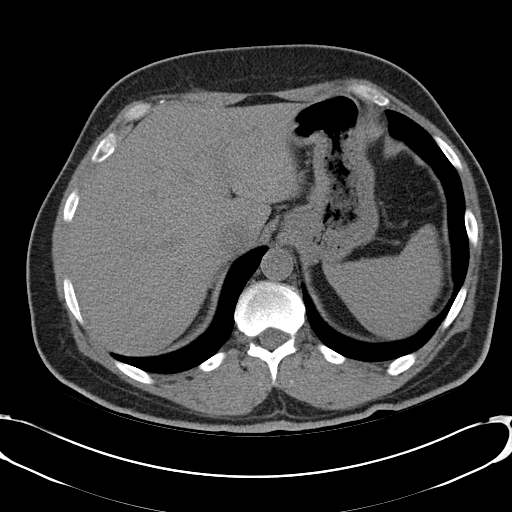
[im 93/97  soft-tissue]
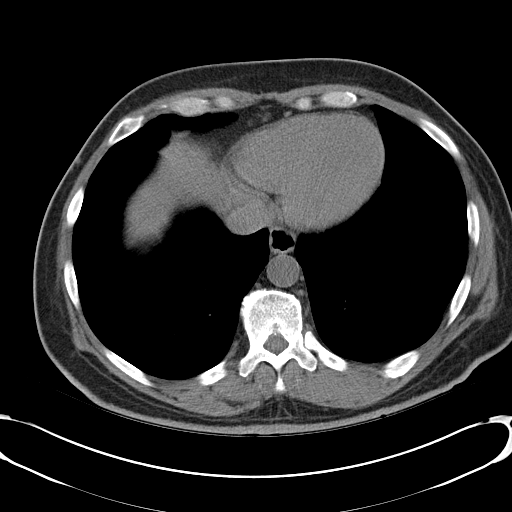

[Series 4: mpr coronal · coronal · 0.73mm/px · 3 of 97 slices shown]
[im 33/97  soft-tissue]
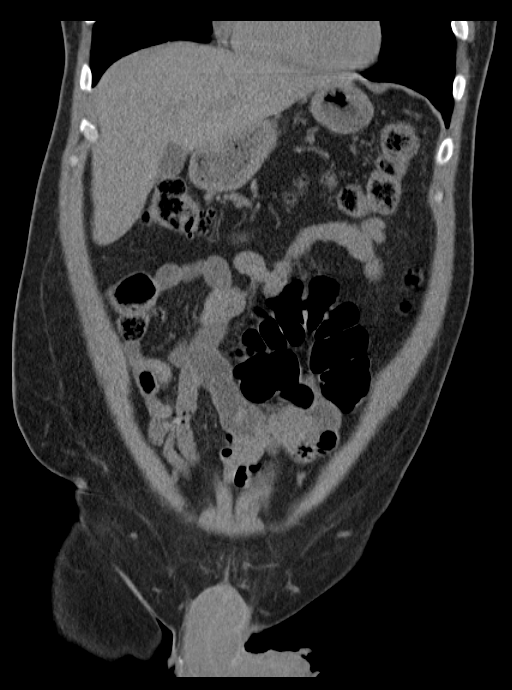
[im 43/97  soft-tissue]
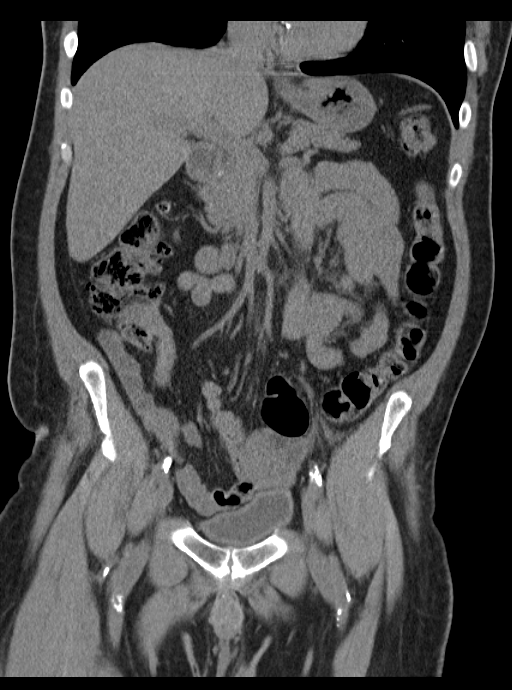
[im 54/97  soft-tissue]
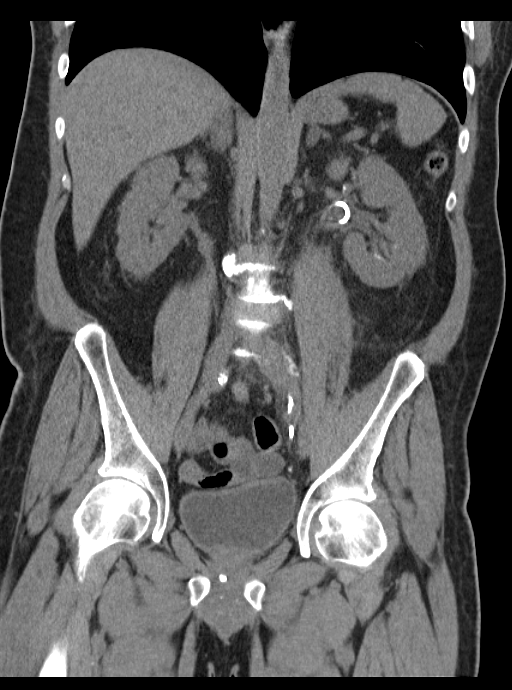

[16 of 46 positions shown; findings below may reference images not displayed]

FINDINGS: There is suggestion of mild bronchiectasis in the lower lobes,
incompletely evaluated. No pleural effusion or consolidation.

The liver, gallbladder, spleen, adrenal glands, and pancreas have an
unremarkable unenhanced appearance. There is no biliary dilatation.

A left ureteral stent has been placed and extends from the renal
pelvis to the bladder. There is no significant residual
hydronephrosis. A few tiny calculi are present in the lower pole of
the left kidney. The dominant 1.2 cm calculus on the prior study is
no longer present. A small curvilinear calcification in the left
renal hilum superior to the stent may be a vascular calcification.
No definite calculi are identified adjacent to the stent in the
ureter. Left-sided perinephric stranding has significantly
decreased. There is mild diffuse left periureteral stranding. No
perinephric fluid collection is seen. No right-sided calculi or
hydronephrosis is seen.

There is no evidence of bowel obstruction. Colonic diverticulosis is
noted without evidence of diverticulitis. The appendix is
unremarkable.

Moderate atherosclerotic calcification is noted of the abdominal
aorta and its major branch vessels. The bladder is unremarkable. No
free fluid or enlarged lymph nodes are identified. No acute osseous
abnormality is identified.
IMPRESSION: Interval left ureteral stent placement. A few tiny remaining calculi
in the left lower pole without significant residual hydronephrosis.
Decreased perinephric stranding.

## 2016-11-21 ENCOUNTER — Encounter (HOSPITAL_COMMUNITY): Payer: Self-pay

## 2016-11-21 ENCOUNTER — Emergency Department (HOSPITAL_COMMUNITY): Payer: Medicare HMO

## 2016-11-21 ENCOUNTER — Inpatient Hospital Stay (HOSPITAL_COMMUNITY)
Admission: EM | Admit: 2016-11-21 | Discharge: 2016-11-23 | DRG: 065 | Disposition: A | Discharge status: Home / Self Care | Payer: Medicare HMO | Attending: Internal Medicine | Admitting: Internal Medicine

## 2016-11-21 ENCOUNTER — Inpatient Hospital Stay (HOSPITAL_COMMUNITY): Payer: Medicare HMO

## 2016-11-21 DIAGNOSIS — F17210 Nicotine dependence, cigarettes, uncomplicated: Secondary | ICD-10-CM | POA: Diagnosis present

## 2016-11-21 DIAGNOSIS — Z9889 Other specified postprocedural states: Secondary | ICD-10-CM

## 2016-11-21 DIAGNOSIS — M1990 Unspecified osteoarthritis, unspecified site: Secondary | ICD-10-CM | POA: Diagnosis present

## 2016-11-21 DIAGNOSIS — Z794 Long term (current) use of insulin: Secondary | ICD-10-CM

## 2016-11-21 DIAGNOSIS — H5461 Unqualified visual loss, right eye, normal vision left eye: Secondary | ICD-10-CM | POA: Diagnosis present

## 2016-11-21 DIAGNOSIS — Z7982 Long term (current) use of aspirin: Secondary | ICD-10-CM | POA: Diagnosis not present

## 2016-11-21 DIAGNOSIS — E11649 Type 2 diabetes mellitus with hypoglycemia without coma: Secondary | ICD-10-CM | POA: Diagnosis present

## 2016-11-21 DIAGNOSIS — I639 Cerebral infarction, unspecified: Secondary | ICD-10-CM | POA: Diagnosis present

## 2016-11-21 DIAGNOSIS — Z823 Family history of stroke: Secondary | ICD-10-CM

## 2016-11-21 DIAGNOSIS — E785 Hyperlipidemia, unspecified: Secondary | ICD-10-CM | POA: Diagnosis present

## 2016-11-21 DIAGNOSIS — H409 Unspecified glaucoma: Secondary | ICD-10-CM | POA: Diagnosis present

## 2016-11-21 DIAGNOSIS — Z811 Family history of alcohol abuse and dependence: Secondary | ICD-10-CM | POA: Diagnosis not present

## 2016-11-21 DIAGNOSIS — I129 Hypertensive chronic kidney disease with stage 1 through stage 4 chronic kidney disease, or unspecified chronic kidney disease: Secondary | ICD-10-CM | POA: Diagnosis present

## 2016-11-21 DIAGNOSIS — Z809 Family history of malignant neoplasm, unspecified: Secondary | ICD-10-CM | POA: Diagnosis not present

## 2016-11-21 DIAGNOSIS — H548 Legal blindness, as defined in USA: Secondary | ICD-10-CM | POA: Diagnosis present

## 2016-11-21 DIAGNOSIS — E1122 Type 2 diabetes mellitus with diabetic chronic kidney disease: Secondary | ICD-10-CM | POA: Diagnosis present

## 2016-11-21 DIAGNOSIS — E119 Type 2 diabetes mellitus without complications: Secondary | ICD-10-CM

## 2016-11-21 DIAGNOSIS — N400 Enlarged prostate without lower urinary tract symptoms: Secondary | ICD-10-CM | POA: Diagnosis present

## 2016-11-21 DIAGNOSIS — Z833 Family history of diabetes mellitus: Secondary | ICD-10-CM

## 2016-11-21 DIAGNOSIS — Z79899 Other long term (current) drug therapy: Secondary | ICD-10-CM | POA: Diagnosis not present

## 2016-11-21 DIAGNOSIS — E1142 Type 2 diabetes mellitus with diabetic polyneuropathy: Secondary | ICD-10-CM | POA: Diagnosis not present

## 2016-11-21 DIAGNOSIS — I6350 Cerebral infarction due to unspecified occlusion or stenosis of unspecified cerebral artery: Secondary | ICD-10-CM | POA: Diagnosis not present

## 2016-11-21 DIAGNOSIS — I10 Essential (primary) hypertension: Secondary | ICD-10-CM | POA: Diagnosis not present

## 2016-11-21 DIAGNOSIS — N184 Chronic kidney disease, stage 4 (severe): Secondary | ICD-10-CM | POA: Diagnosis present

## 2016-11-21 DIAGNOSIS — K219 Gastro-esophageal reflux disease without esophagitis: Secondary | ICD-10-CM | POA: Diagnosis present

## 2016-11-21 LAB — DIFFERENTIAL
Basophils Absolute: 0 10*3/uL (ref 0.0–0.1)
Basophils Relative: 0 %
EOS ABS: 0.5 10*3/uL (ref 0.0–0.7)
EOS PCT: 6 %
Lymphocytes Relative: 18 %
Lymphs Abs: 1.3 10*3/uL (ref 0.7–4.0)
MONO ABS: 0.7 10*3/uL (ref 0.1–1.0)
Monocytes Relative: 10 %
NEUTROS PCT: 66 %
Neutro Abs: 4.6 10*3/uL (ref 1.7–7.7)

## 2016-11-21 LAB — COMPREHENSIVE METABOLIC PANEL
ALBUMIN: 3.6 g/dL (ref 3.5–5.0)
ALT: 21 U/L (ref 17–63)
ANION GAP: 8 (ref 5–15)
AST: 16 U/L (ref 15–41)
Alkaline Phosphatase: 113 U/L (ref 38–126)
BILIRUBIN TOTAL: 0.4 mg/dL (ref 0.3–1.2)
BUN: 46 mg/dL — ABNORMAL HIGH (ref 6–20)
CALCIUM: 9.4 mg/dL (ref 8.9–10.3)
CO2: 25 mmol/L (ref 22–32)
Chloride: 99 mmol/L — ABNORMAL LOW (ref 101–111)
Creatinine, Ser: 2.22 mg/dL — ABNORMAL HIGH (ref 0.61–1.24)
GFR calc non Af Amer: 30 mL/min — ABNORMAL LOW (ref >60)
GFR, EST AFRICAN AMERICAN: 34 mL/min — AB (ref >60)
Glucose, Bld: 391 mg/dL — ABNORMAL HIGH (ref 65–99)
POTASSIUM: 4.3 mmol/L (ref 3.5–5.1)
SODIUM: 132 mmol/L — AB (ref 135–145)
TOTAL PROTEIN: 7 g/dL (ref 6.5–8.1)

## 2016-11-21 LAB — URINALYSIS, ROUTINE W REFLEX MICROSCOPIC
BACTERIA UA: NONE SEEN
Bilirubin Urine: NEGATIVE
Glucose, UA: >=500 mg/dL — AB
HGB URINE DIPSTICK: NEGATIVE
Ketones, ur: NEGATIVE mg/dL
Leukocytes, UA: NEGATIVE
Nitrite: NEGATIVE
PROTEIN: 30 mg/dL — AB
SPECIFIC GRAVITY, URINE: 1.01 (ref 1.005–1.030)
SQUAMOUS EPITHELIAL / LPF: NONE SEEN
pH: 5 (ref 5.0–8.0)

## 2016-11-21 LAB — CBC
HCT: 35.1 % — ABNORMAL LOW (ref 39.0–52.0)
Hemoglobin: 11.5 g/dL — ABNORMAL LOW (ref 13.0–17.0)
MCH: 28.8 pg (ref 26.0–34.0)
MCHC: 32.8 g/dL (ref 30.0–36.0)
MCV: 88 fL (ref 78.0–100.0)
PLATELETS: 196 10*3/uL (ref 150–400)
RBC: 3.99 MIL/uL — ABNORMAL LOW (ref 4.22–5.81)
RDW: 12.8 % (ref 11.5–15.5)
WBC: 7 10*3/uL (ref 4.0–10.5)

## 2016-11-21 LAB — RAPID URINE DRUG SCREEN, HOSP PERFORMED
AMPHETAMINES: NOT DETECTED
BENZODIAZEPINES: NOT DETECTED
Barbiturates: NOT DETECTED
COCAINE: NOT DETECTED
Opiates: NOT DETECTED
Tetrahydrocannabinol: NOT DETECTED

## 2016-11-21 LAB — PROTIME-INR
INR: 0.9
PROTHROMBIN TIME: 12.1 s (ref 11.4–15.2)

## 2016-11-21 LAB — TROPONIN I: Troponin I: <0.03 ng/mL (ref <0.03)

## 2016-11-21 LAB — CBG MONITORING, ED: Glucose-Capillary: 379 mg/dL — ABNORMAL HIGH (ref 65–99)

## 2016-11-21 LAB — APTT: aPTT: 33 seconds (ref 24–36)

## 2016-11-21 LAB — ETHANOL

## 2016-11-21 MED ORDER — PANTOPRAZOLE SODIUM 40 MG PO TBEC
40.0000 mg | DELAYED_RELEASE_TABLET | Freq: Every day | ORAL | Status: DC
  Administered 2016-11-22 – 2016-11-23 (×2): 40 mg via ORAL
  Filled 2016-11-21 (×2): qty 1

## 2016-11-21 MED ORDER — CARBOXYMETHYLCELLULOSE SODIUM 0.5 % OP SOLN
1.0000 [drp] | Freq: Four times a day (QID) | OPHTHALMIC | Status: DC
Start: 2016-11-21 — End: 2016-11-21

## 2016-11-21 MED ORDER — DORZOLAMIDE HCL-TIMOLOL MAL 2-0.5 % OP SOLN
1.0000 [drp] | Freq: Two times a day (BID) | OPHTHALMIC | Status: DC
  Administered 2016-11-22 – 2016-11-23 (×2): 1 [drp] via OPHTHALMIC
  Filled 2016-11-21: qty 10

## 2016-11-21 MED ORDER — CLOPIDOGREL BISULFATE 75 MG PO TABS
75.0000 mg | ORAL_TABLET | Freq: Every day | ORAL | Status: DC
  Administered 2016-11-22 – 2016-11-23 (×2): 75 mg via ORAL
  Filled 2016-11-21 (×2): qty 1

## 2016-11-21 MED ORDER — KETOROLAC TROMETHAMINE 0.5 % OP SOLN
1.0000 [drp] | Freq: Four times a day (QID) | OPHTHALMIC | Status: DC
  Administered 2016-11-21 – 2016-11-23 (×6): 1 [drp] via OPHTHALMIC
  Filled 2016-11-21: qty 3

## 2016-11-21 MED ORDER — SIMETHICONE 80 MG PO CHEW: 160.0000 mg | CHEWABLE_TABLET | Freq: Two times a day (BID) | ORAL | Status: DC | PRN

## 2016-11-21 MED ORDER — SODIUM CHLORIDE 0.9 % IV SOLN
INTRAVENOUS | Status: DC
  Administered 2016-11-21: 22:00:00 via INTRAVENOUS

## 2016-11-21 MED ORDER — ENOXAPARIN SODIUM 40 MG/0.4ML ~~LOC~~ SOLN
40.0000 mg | SUBCUTANEOUS | Status: DC
  Administered 2016-11-21 – 2016-11-23 (×2): 40 mg via SUBCUTANEOUS
  Filled 2016-11-21 (×2): qty 0.4

## 2016-11-21 MED ORDER — ASPIRIN EC 81 MG PO TBEC
81.0000 mg | DELAYED_RELEASE_TABLET | Freq: Every day | ORAL | Status: DC
  Administered 2016-11-22 – 2016-11-23 (×2): 81 mg via ORAL
  Filled 2016-11-21 (×2): qty 1

## 2016-11-21 MED ORDER — CIPROFLOXACIN HCL 0.3 % OP SOLN
1.0000 [drp] | Freq: Four times a day (QID) | OPHTHALMIC | Status: DC
  Administered 2016-11-21 – 2016-11-23 (×6): 1 [drp] via OPHTHALMIC
  Filled 2016-11-21: qty 2.5

## 2016-11-21 MED ORDER — INSULIN ASPART 100 UNIT/ML ~~LOC~~ SOLN
5.0000 [IU] | Freq: Once | SUBCUTANEOUS | Status: AC
  Administered 2016-11-21: 5 [IU] via INTRAVENOUS
  Filled 2016-11-21: qty 1

## 2016-11-21 MED ORDER — CLOPIDOGREL BISULFATE 75 MG PO TABS
75.0000 mg | ORAL_TABLET | Freq: Once | ORAL | Status: AC
  Administered 2016-11-21: 75 mg via ORAL
  Filled 2016-11-21: qty 1

## 2016-11-21 MED ORDER — ONDANSETRON HCL 4 MG/2ML IJ SOLN: 4.0000 mg | Freq: Four times a day (QID) | INTRAMUSCULAR | Status: DC | PRN

## 2016-11-21 MED ORDER — TAMSULOSIN HCL 0.4 MG PO CAPS
0.4000 mg | ORAL_CAPSULE | Freq: Every day | ORAL | Status: DC
  Administered 2016-11-22 – 2016-11-23 (×2): 0.4 mg via ORAL
  Filled 2016-11-21 (×2): qty 1

## 2016-11-21 MED ORDER — BRIMONIDINE TARTRATE 0.2 % OP SOLN
OPHTHALMIC | Status: AC
  Filled 2016-11-21: qty 5

## 2016-11-21 MED ORDER — ONDANSETRON HCL 4 MG PO TABS: 4.0000 mg | ORAL_TABLET | Freq: Four times a day (QID) | ORAL | Status: DC | PRN

## 2016-11-21 MED ORDER — CIPROFLOXACIN HCL 0.3 % OP SOLN
OPHTHALMIC | Status: AC
  Filled 2016-11-21: qty 2.5

## 2016-11-21 MED ORDER — POLYVINYL ALCOHOL 1.4 % OP SOLN: 1.0000 [drp] | OPHTHALMIC | Status: DC | PRN

## 2016-11-21 MED ORDER — INSULIN GLARGINE 100 UNIT/ML ~~LOC~~ SOLN
20.0000 [IU] | Freq: Every day | SUBCUTANEOUS | Status: DC
Start: 2016-11-22 — End: 2016-11-23
  Administered 2016-11-22 – 2016-11-23 (×2): 20 [IU] via SUBCUTANEOUS
  Filled 2016-11-21 (×5): qty 0.2

## 2016-11-21 MED ORDER — DORZOLAMIDE HCL-TIMOLOL MAL 2-0.5 % OP SOLN
OPHTHALMIC | Status: AC
  Filled 2016-11-21: qty 10

## 2016-11-21 MED ORDER — BRIMONIDINE TARTRATE 0.2 % OP SOLN
1.0000 [drp] | Freq: Two times a day (BID) | OPHTHALMIC | Status: DC
  Administered 2016-11-22 – 2016-11-23 (×2): 1 [drp] via OPHTHALMIC
  Filled 2016-11-21: qty 5

## 2016-11-21 MED ORDER — PREDNISOLONE ACETATE 1 % OP SUSP
OPHTHALMIC | Status: AC
  Filled 2016-11-21: qty 5

## 2016-11-21 MED ORDER — PREDNISOLONE ACETATE 1 % OP SUSP
1.0000 [drp] | OPHTHALMIC | Status: DC
  Administered 2016-11-22 – 2016-11-23 (×17): 1 [drp] via OPHTHALMIC
  Filled 2016-11-21: qty 1

## 2016-11-21 MED ORDER — LISINOPRIL 10 MG PO TABS: 40.0000 mg | ORAL_TABLET | Freq: Every day | ORAL | Status: DC

## 2016-11-21 MED ORDER — FUROSEMIDE 40 MG PO TABS
40.0000 mg | ORAL_TABLET | Freq: Every morning | ORAL | Status: DC
  Administered 2016-11-22 – 2016-11-23 (×2): 40 mg via ORAL
  Filled 2016-11-21 (×2): qty 1

## 2016-11-21 MED ORDER — SODIUM CHLORIDE 0.9 % IV BOLUS (SEPSIS)
1000.0000 mL | Freq: Once | INTRAVENOUS | Status: AC
  Administered 2016-11-21: 1000 mL via INTRAVENOUS

## 2016-11-21 MED ORDER — GLIPIZIDE 5 MG PO TABS
20.0000 mg | ORAL_TABLET | Freq: Two times a day (BID) | ORAL | Status: DC
  Filled 2016-11-21: qty 4

## 2016-11-21 MED ORDER — LATANOPROST 0.005 % OP SOLN
1.0000 [drp] | Freq: Every day | OPHTHALMIC | Status: DC
  Filled 2016-11-21: qty 2.5

## 2016-11-21 MED ORDER — LATANOPROST 0.005 % OP SOLN
OPHTHALMIC | Status: AC
  Filled 2016-11-21: qty 2.5

## 2016-11-21 MED ORDER — ATORVASTATIN CALCIUM 40 MG PO TABS
40.0000 mg | ORAL_TABLET | Freq: Every day | ORAL | Status: DC
  Administered 2016-11-21 – 2016-11-23 (×2): 40 mg via ORAL
  Filled 2016-11-21 (×2): qty 1

## 2016-11-21 MED ORDER — INSULIN ASPART 100 UNIT/ML ~~LOC~~ SOLN
0.0000 [IU] | Freq: Three times a day (TID) | SUBCUTANEOUS | Status: DC
  Administered 2016-11-22: 3 [IU] via SUBCUTANEOUS
  Administered 2016-11-23: 1 [IU] via SUBCUTANEOUS

## 2016-11-21 MED ORDER — STROKE: EARLY STAGES OF RECOVERY BOOK
Freq: Once | Status: AC
  Administered 2016-11-22: 20:00:00
  Filled 2016-11-21: qty 1

## 2016-11-21 NOTE — ED Triage Notes (Signed)
Pt presents to the ed with ems for complaints of weakness in his left arm and leg that started yesterday at 1200.  His blood sugar has been elevated all day 379 today.  He just had surgery on his left eye Friday. He is blind in his right eye.  Alert and oriented, denies any pain.

## 2016-11-21 NOTE — H&P (Signed)
History and Physical    Trevor Crawford QIO:[id number] DOB: [birth year] DOA: 11/21/2016  PCP: Pcp Not In System   Patient coming from: Home.  I have personally briefly reviewed patient's old medical records in [place]  Chief Complaint: Left-sided numbness.  HPI: Trevor Crawford is a 66 y.o. male with medical history significant of osteoarthritis, cataracts, chronic kidney disease, depression, type 2 diabetes, GERD, glaucoma, hyperlipidemia, hypertension, legally blind right eye, peripheral neuropathy who is coming to the emergency department with complaints of left-sided numbness and mild weakness since yesterday.  Per patient, yesterday he felt numbness and weakness on his left sided extremities which has persisted until today. He thought that he was due to his blood glucose being elevated (he has been in the 300- 400 range in the past few days), which had happened before, but became concerned after he persisted through today. He denies vision changes, but just underwent cataract surgery on his left eye. He denies language comprehension trouble or slurred speech. He states that his balance is a little impaired and he has noticed that he staggers when he is walking. Denies chest pain, palpitations, dizziness, diaphoresis, pitting edema of the lower extremities, abdominal pain, diarrhea, nausea, emesis, dysuria or frequency.  ED Course: His workup in the emergency department shows WBC is 7.0, hemoglobin 11.5 g/dL and platelets 196. Sodium 132, potassium 4.3, chloride 99 and bicarbonate 25 mmol/L. His blood glucose 391, BUN 46 and creatinine 2.22 mg/dL, which is around patient's baseline renal function.  Imaging: CT scan of the brain did not show any acute abnormalities. MRI/MRA of the brain showed acute 5 mm nonhemorrhagic stroke of the right midbrain and 3 previous small infarcts. Please see full radiology report for further detail.  Review of Systems: As per HPI otherwise 10 point review of  systems negative.    Past Medical History:  Diagnosis Date  . Arthritis   . Cataract    bilateral  . Chronic kidney disease   . Depression   . Diabetes mellitus without complication ([place])   . GERD (gastroesophageal reflux disease)   . Glaucoma   . Hyperlipidemia   . Hypertension   . Legally blind in right eye, as defined in [place]   . Neuromuscular disorder ([place])   . Neuropathy     Past Surgical History:  Procedure Laterality Date  . CYSTOSCOPY WITH RETROGRADE PYELOGRAM, URETEROSCOPY AND STENT PLACEMENT Left 10/06/2015   Procedure: CYSTOSCOPY WITH RETROGRADE PYELOGRAM, AND LEFT STENT PLACEMENT ;  Surgeon: Cleon Gustin, MD;  Location: [facility];  Service: Urology;  Laterality: Left;     reports that he has been smoking Cigarettes.  He has a 55.00 pack-year smoking history. He has never used smokeless tobacco. He reports that he does not drink alcohol or use drugs.  No Known Allergies  Family History  Problem Relation Age of Onset  . Cancer Mother   . Alcoholism Father   . Cancer Father   . Cancer Other   . Cancer Sister   . Diabetes Brother   . Stroke Brother     Prior to Admission medications   Medication Sig Start Date End Date Taking? Authorizing Provider  aspirin EC 81 MG tablet Take 81 mg by mouth daily.   Yes Historical Provider, MD  atorvastatin (LIPITOR) 80 MG tablet Take 40 mg by mouth at bedtime.   Yes Historical Provider, MD  brimonidine (ALPHAGAN) 0.2 % ophthalmic solution Place 1 drop into both eyes 2 (two) times daily.  Yes Historical Provider, MD  carboxymethylcellulose (REFRESH PLUS) 0.5 % SOLN Apply 1 drop to eye 4 (four) times daily.   Yes Historical Provider, MD  dorzolamide-timolol (COSOPT) 22.3-6.8 MG/ML ophthalmic solution 1 drop 2 (two) times daily.   Yes Historical Provider, MD  furosemide (LASIX) 40 MG tablet Take 40 mg by mouth every morning.   Yes Historical Provider, MD  glipiZIDE (GLUCOTROL) 10 MG tablet Take 20 mg by mouth 2 (two) times  daily.   Yes Historical Provider, MD  Insulin Glargine (LANTUS SOLOSTAR) 100 UNIT/ML Solostar Pen Inject 20 Units into the skin daily.    Yes Historical Provider, MD  latanoprost (XALATAN) 0.005 % ophthalmic solution Place 1 drop into both eyes at bedtime.   Yes Historical Provider, MD  lisinopril (PRINIVIL,ZESTRIL) 40 MG tablet Take 40 mg by mouth daily.   Yes Historical Provider, MD  omeprazole (PRILOSEC) 20 MG capsule Take 20 mg by mouth daily.   Yes Historical Provider, MD  simethicone (MYLICON) 80 MG chewable tablet Chew 160 mg by mouth 2 (two) times daily as needed for flatulence.   Yes Historical Provider, MD  tamsulosin (FLOMAX) 0.4 MG CAPS capsule Take 1 capsule (0.4 mg total) by mouth daily. 11/07/15  Yes Ezequiel Essex, MD    Physical Exam:  Constitutional: NAD, calm, comfortable Vitals:   11/21/16 1830 11/21/16 1900 11/21/16 1917 11/21/16 1930  BP: (!) 151/74 (!) 145/74  (!) 170/89  Pulse: 69 77    Resp: 15 14    Temp:   97.5 F (36.4 C)   TempSrc:      SpO2: 97% 100%    Weight:      Height:       Eyes: Right eye blindness, lids and conjunctivae normal ENMT: Mucous membranes are moist. Posterior pharynx clear of any exudate or lesions. Dentition in poor state of repair. Neck: normal, supple, no masses, no thyromegaly Respiratory: clear to auscultation bilaterally, no wheezing, no crackles. Normal respiratory effort. No accessory muscle use.  Cardiovascular: Regular rate and rhythm, no murmurs / rubs / gallops. No extremity edema. 2+ pedal pulses. No carotid bruits.  Abdomen: Soft, no tenderness, no masses palpated. No hepatosplenomegaly. Bowel sounds positive.  Musculoskeletal: no clubbing / cyanosis.  Good ROM, no contractures. Normal muscle tone.  Skin: No rashes or lesions on limited skin exam. Neurologic: CN 2-12 grossly intact. Sensation intact, DTR normal. 4/5 left-sided hemiparesis with mild left-sided pronator drift. unable to evaluate gait Psychiatric: Normal  judgment and insight. Alert and oriented x 3. Normal mood.    Labs on Admission: I have personally reviewed following labs and imaging studies  CBC:  Recent Labs Lab 11/21/16 1441  WBC 7.0  NEUTROABS 4.6  HGB 11.5*  HCT 35.1*  MCV 88.0  PLT 834   Basic Metabolic Panel:  Recent Labs Lab 11/21/16 1441  NA 132*  K 4.3  CL 99*  CO2 25  GLUCOSE 391*  BUN 46*  CREATININE 2.22*  CALCIUM 9.4   GFR: Estimated Creatinine Clearance: 36.9 mL/min (A) (by C-G formula based on SCr of 2.22 mg/dL (H)). Liver Function Tests:  Recent Labs Lab 11/21/16 1441  AST 16  ALT 21  ALKPHOS 113  BILITOT 0.4  PROT 7.0  ALBUMIN 3.6   No results for input(s): LIPASE, AMYLASE in the last 168 hours. No results for input(s): AMMONIA in the last 168 hours. Coagulation Profile:  Recent Labs Lab 11/21/16 1441  INR 0.90   Cardiac Enzymes: No results for input(s): CKTOTAL, CKMB, CKMBINDEX,  TROPONINI in the last 168 hours. BNP (last 3 results) No results for input(s): PROBNP in the last 8760 hours. HbA1C: No results for input(s): HGBA1C in the last 72 hours. CBG:  Recent Labs Lab 11/21/16 1407  GLUCAP 379*   Lipid Profile: No results for input(s): CHOL, HDL, LDLCALC, TRIG, CHOLHDL, LDLDIRECT in the last 72 hours. Thyroid Function Tests: No results for input(s): TSH, T4TOTAL, FREET4, T3FREE, THYROIDAB in the last 72 hours. Anemia Panel: No results for input(s): VITAMINB12, FOLATE, FERRITIN, TIBC, IRON, RETICCTPCT in the last 72 hours. Urine analysis:    Component Value Date/Time   COLORURINE YELLOW 11/07/2015 0923   APPEARANCEUR CLEAR 11/07/2015 0923   LABSPEC 1.025 11/07/2015 0923   PHURINE 6.0 11/07/2015 0923   GLUCOSEU 100 (A) 11/07/2015 0923   HGBUR LARGE (A) 11/07/2015 0923   BILIRUBINUR NEGATIVE 11/07/2015 0923   KETONESUR 15 (A) 11/07/2015 0923   PROTEINUR 100 (A) 11/07/2015 0923   UROBILINOGEN 0.2 05/12/2013 2357   NITRITE NEGATIVE 11/07/2015 0923   LEUKOCYTESUR  NEGATIVE 11/07/2015 0923    Radiological Exams on Admission: Ct Head Wo Contrast  Result Date: 11/21/2016 CLINICAL DATA:  Left arm weakness, recent left eye surgery, hyperglycemia, evaluate for intracranial hemorrhage EXAM: CT HEAD WITHOUT CONTRAST TECHNIQUE: Contiguous axial images were obtained from the base of the skull through the vertex without intravenous contrast. COMPARISON:  None. FINDINGS: Brain: No evidence of acute infarction, hemorrhage, hydrocephalus, extra-axial collection or mass lesion/mass effect. Subcortical white matter and periventricular small vessel ischemic changes. Vascular: Intracranial atherosclerosis. Skull: Normal. Negative for fracture or focal lesion. Sinuses/Orbits: Mild partial opacification of the bilateral frontal sinuses (left greater than right) and bilateral ethmoid sinuses. Mastoid air cells are clear. Other: None. IMPRESSION: No evidence of acute intracranial abnormality. Small vessel ischemic changes. Electronically Signed   By: Julian Hy M.D.   On: 11/21/2016 16:39   Mr Jodene Nam Head Wo Contrast  Result Date: 11/21/2016 CLINICAL DATA:  Stroke. Acute onset of left upper and lower extremity weakness yesterday at 12 o'clock noon. Hyperglycemia. Acute brain stem infarct. EXAM: MRA HEAD WITHOUT CONTRAST TECHNIQUE: Angiographic images of the Circle of Willis were obtained using MRA technique without intravenous contrast. COMPARISON:  MRI brain from the same day. FINDINGS: The internal carotid arteries are within normal limits the high cervical segments through the ICA termini bilaterally. The A1 and M1 segments are normal. No definite anterior communicating artery is present. The MCA bifurcations are intact. ACA and MCA branch vessels are within normal limits. The left vertebral artery is the dominant vessel. The right vertebral artery is not visualized. This may be hypoplastic or occluded. The left PICA origin is visualized and normal. The right AICA is dominant. The  posterior cerebral arteries originate from the basilar tip. Distal PCA branch vessels are not well seen. IMPRESSION: 1. Nonvisualization of the right vertebral artery. This may represent a hypoplastic or occluded vessel. 2. Dominant left vertebral artery. 3. No other significant proximal stenosis, aneurysm, or branch vessel occlusion. Electronically Signed   By: San Morelle M.D.   On: 11/21/2016 17:46   Mr Brain Wo Contrast  Result Date: 11/21/2016 CLINICAL DATA:  Left arm and leg weakness beginning yesterday at 12 o'clock noon. Hyperglycemia. Recent left eye surgery. EXAM: MRI HEAD WITHOUT CONTRAST TECHNIQUE: Multiplanar, multiecho pulse sequences of the brain and surrounding structures were obtained without intravenous contrast. COMPARISON:  CT head without contrast from the same day. FINDINGS: Brain: An acute nonhemorrhagic 5 mm infarct is present in the right midbrain,  likely impacting the cortical spinal tracts. No other acute infarct is present. Mild atrophy and moderate age advanced white matter disease is present bilaterally. There is a remote lacunar infarct in the left corona radiata. Remote lacunar infarcts are present in the thalami bilaterally. White matter changes extend into the brainstem. A remote lacunar infarct is present in the right pons. The cerebellum is normal bilaterally. Vascular: Flow is present in the major intracranial arteries. Skull and upper cervical spine: Skullbase is a normal limits. The craniocervical junction is normal. Midline sagittal structures are unremarkable. Sinuses/Orbits: Mild mucosal thickening is present in the maxillary and sinuses and ethmoid air cells, left greater than right. There is partial opacification of the left frontal sinus. Mucosal thickening is present in the right frontal sinus. The sphenoid sinuses are clear bilaterally. Fluid is present in the mastoid air cells bilaterally. No obstructing nasopharyngeal lesion is present. IMPRESSION: 1.  Acute 5 mm nonhemorrhagic infarct involving the right midbrain. Given the symptoms, this likely impacts the cortical spinal tracts. 2. Remote lacunar infarct just inferior in the right pons. 3. Remote lacunar infarcts of the thalami bilaterally. 4. Remote lacunar infarct of the left corona radiata. 5. Moderate age advanced white matter disease likely reflects the sequela of chronic microvascular ischemia. These results were called by telephone at the time of interpretation on 11/21/2016 at 5:43 pm to Dr. Roderic Palau , who verbally acknowledged these results. Electronically Signed   By: San Morelle M.D.   On: 11/21/2016 17:44    EKG: Independently reviewed. Vent. rate 69 BPM PR interval * ms QRS duration 102 ms QT/QTc 408/438 ms P-R-T axes 55 4 78 Sinus rhythm Abnormal R-wave progression, early transition  Assessment/Plan Principal Problem:   Acute CVA (cerebrovascular accident) ([place]) Admit to telemetry/observation. Frequent neuro checks. Check hemoglobin A1c and fasting lipid profile in a.m. Continue aspirin 81 mg by mouth daily. Start clopidogrel 75 mg by mouth daily. Check echocardiogram and carotid Doppler in a.m. Neurology evaluation in a.m.  Active Problems:   HTN (hypertension) Will be permissive in the setting of acute CVA. Hold lisinopril. Monitor blood pressure.    BPH (benign prostatic hyperplasia) Continue Flomax by mouth daily.    T2DM (type 2 diabetes mellitus) (HCC) Carbohydrate modified diet. Continue Lantus 20 units SQ daily. Continue Glucotrol 20 mg by mouth twice a day. CBG monitoring with regular insulin sliding scale while in the hospital.    CKD stage 4 due to type 2 diabetes mellitus (H[facility]) Monitor BUN, creatinine and electrolytes.    Glaucoma Continue Cosopt drops twice a day in each eye. Continue Alphagan drops twice a day in each eye.    Hyperlipidemia Continue atorvastatin 40 mg by mouth daily. Monitor LFTs and fasting lipid profile.     GERD (gastroesophageal reflux disease) Protonix 40 mg by mouth daily     DVT prophylaxis: Lovenox SQ. Code Status: Full code. Family Communication:  Disposition Plan: Admit for CVA work up, neurology, PT/OT evaluation. Consults called: Neurology (Dr. Phillips Odor) Admission status: Inpatient/Telemetry.   Reubin Milan MD [facility] Pager [phone].  If 7PM-7AM, please contact night-coverage [email or web address] Password [facility]  11/21/2016, 8:18 PM

## 2016-11-21 NOTE — ED Provider Notes (Signed)
[facility] DEPT Provider Note   CSN: [id number] Arrival date & time: 11/21/16  1355     History   Chief Complaint Chief Complaint  Patient presents with  . Weakness    HPI Trevor Crawford is a 66 y.o. male.  66 year old history of chronic kidney disease, diabetes, hypertension presents to the emergency department today secondary to left-sided facial paresthesias and left arm paresthesias. He doesn't state that he has any weakness with this and it started a couple days ago. Never any that this before. It seems to improve a little bit since then but is still very much present. He can feel things fine and just feels paresthesias. No other associated or exacerbating symptoms aside from persistent hyperglycemia. No recent illnesses.       Past Medical History:  Diagnosis Date  . Arthritis   . Cataract    bilateral  . Chronic kidney disease   . Depression   . Diabetes mellitus without complication ([place])   . GERD (gastroesophageal reflux disease)   . Glaucoma   . Hyperlipidemia   . Hypertension   . Legally blind in right eye, as defined in [place]   . Neuromuscular disorder ([place])   . Neuropathy     Patient Active Problem List   Diagnosis Date Noted  . Acute CVA (cerebrovascular accident) ([place]) 11/21/2016  . Glaucoma 11/21/2016  . Hyperlipidemia 11/21/2016  . GERD (gastroesophageal reflux disease) 11/21/2016  . CKD stage 4 due to type 2 diabetes mellitus ([place]) 05/10/2016  . Depression 05/07/2016  . HTN (hypertension) 05/07/2016  . Blindness 05/07/2016  . BPH (benign prostatic hyperplasia) 05/07/2016  . T2DM (type 2 diabetes mellitus) ([place]) 05/07/2016  . Renal calculi 10/06/2015  . Ureteral calculus, left 10/05/2015    Past Surgical History:  Procedure Laterality Date  . CATARACT EXTRACTION Left   . CYSTOSCOPY WITH RETROGRADE PYELOGRAM, URETEROSCOPY AND STENT PLACEMENT Left 10/06/2015   Procedure: CYSTOSCOPY WITH RETROGRADE PYELOGRAM, AND LEFT STENT PLACEMENT ;   Surgeon: Cleon Gustin, MD;  Location: [facility];  Service: Urology;  Laterality: Left;       Home Medications    Prior to Admission medications   Medication Sig Start Date End Date Taking? Authorizing Provider  aspirin EC 81 MG tablet Take 81 mg by mouth daily.   Yes Historical Provider, MD  atorvastatin (LIPITOR) 80 MG tablet Take 40 mg by mouth at bedtime.   Yes Historical Provider, MD  brimonidine (ALPHAGAN) 0.2 % ophthalmic solution Place 1 drop into both eyes 2 (two) times daily.   Yes Historical Provider, MD  carboxymethylcellulose (REFRESH PLUS) 0.5 % SOLN Apply 1 drop to eye 4 (four) times daily.   Yes Historical Provider, MD  ciprofloxacin (CILOXAN) 0.3 % ophthalmic solution Place 1 drop into the left eye 4 (four) times daily. Administer 1 drop, every 2 hours, while awake, for 2 days. Then 1 drop, every 4 hours, while awake, for the next 5 days.   Yes Historical Provider, MD  dorzolamide-timolol (COSOPT) 22.3-6.8 MG/ML ophthalmic solution 1 drop 2 (two) times daily.   Yes Historical Provider, MD  furosemide (LASIX) 40 MG tablet Take 40 mg by mouth every morning.   Yes Historical Provider, MD  glipiZIDE (GLUCOTROL) 10 MG tablet Take 20 mg by mouth 2 (two) times daily.   Yes Historical Provider, MD  Insulin Glargine (LANTUS SOLOSTAR) 100 UNIT/ML Solostar Pen Inject 20 Units into the skin daily.    Yes Historical Provider, MD  ketorolac (ACULAR) 0.5 % ophthalmic solution  Place 1 drop into the left eye 4 (four) times daily.   Yes Historical Provider, MD  latanoprost (XALATAN) 0.005 % ophthalmic solution Place 1 drop into both eyes at bedtime.   Yes Historical Provider, MD  lisinopril (PRINIVIL,ZESTRIL) 40 MG tablet Take 40 mg by mouth daily.   Yes Historical Provider, MD  omeprazole (PRILOSEC) 20 MG capsule Take 20 mg by mouth daily.   Yes Historical Provider, MD  prednisoLONE acetate (PRED FORTE) 1 % ophthalmic suspension Place 1 drop into the left eye as directed.   Yes Historical  Provider, MD  simethicone (MYLICON) 80 MG chewable tablet Chew 160 mg by mouth 2 (two) times daily as needed for flatulence.   Yes Historical Provider, MD  tamsulosin (FLOMAX) 0.4 MG CAPS capsule Take 1 capsule (0.4 mg total) by mouth daily. 11/07/15  Yes Ezequiel Essex, MD    Family History Family History  Problem Relation Age of Onset  . Cancer Mother   . Alcoholism Father   . Cancer Father   . Cancer Other   . Cancer Sister   . Diabetes Brother   . Stroke Brother     Social History Social History  Substance Use Topics  . Smoking status: Current Every Day Smoker    Packs/day: 1.00    Years: 55.00    Types: Cigarettes  . Smokeless tobacco: Never Used  . Alcohol use No     Allergies   Patient has no known allergies.   Review of Systems Review of Systems  All other systems reviewed and are negative.    Physical Exam Updated Vital Signs BP (!) 166/75 (BP Location: Left Arm)   Pulse 65   Temp 97.7 F (36.5 C) (Oral)   Resp 16   Ht 6' (1.829 m)   Wt 206 lb 9.1 oz (93.7 kg)   SpO2 100%   BMI 28.02 kg/m   Physical Exam  Constitutional: He is oriented to person, place, and time. He appears well-developed and well-nourished.  HENT:  Head: Normocephalic and atraumatic.  Eyes: Conjunctivae and EOM are normal.  Neck: Normal range of motion.  Cardiovascular: Normal rate.   Pulmonary/Chest: Effort normal. No respiratory distress. He has wheezes.  Abdominal: He exhibits no distension.  Musculoskeletal: Normal range of motion.  Neurological: He is alert and oriented to person, place, and time. A cranial nerve deficit (right facial droop noticeable when smiling) is present.  No altered mental status, able to give full seemingly accurate history.  Face is assymmetric when smiling, EOM's intact, pupils equal and reactive, vision intact, tongue and uvula midline without deviation Upper and Lower extremity motor 5/5, intact pain perception in distal extremities, 2+  reflexes in biceps, patella and achilles tendons. Finger to nose normal, heel to shin normal. Walks without assistance or evident ataxia.    Nursing note and vitals reviewed.    ED Treatments / Results  Labs (all labs ordered are listed, but only abnormal results are displayed) Labs Reviewed  CBC - Abnormal; Notable for the following:       Result Value   RBC 3.99 (*)    Hemoglobin 11.5 (*)    HCT 35.1 (*)    All other components within normal limits  COMPREHENSIVE METABOLIC PANEL - Abnormal; Notable for the following:    Sodium 132 (*)    Chloride 99 (*)    Glucose, Bld 391 (*)    BUN 46 (*)    Creatinine, Ser 2.22 (*)    GFR calc non  Af Amer 30 (*)    GFR calc Af Amer 34 (*)    All other components within normal limits  URINALYSIS, ROUTINE W REFLEX MICROSCOPIC - Abnormal; Notable for the following:    Glucose, UA >=500 (*)    Protein, ur 30 (*)    All other components within normal limits  CBG MONITORING, ED - Abnormal; Notable for the following:    Glucose-Capillary 379 (*)    All other components within normal limits  ETHANOL  PROTIME-INR  APTT  DIFFERENTIAL  RAPID URINE DRUG SCREEN, HOSP PERFORMED  TROPONIN I  HIV ANTIBODY (ROUTINE TESTING)  HEMOGLOBIN A1C  LIPID PANEL  CBC  COMPREHENSIVE METABOLIC PANEL    EKG  EKG Interpretation None       Radiology Ct Head Wo Contrast  Result Date: 11/21/2016 CLINICAL DATA:  Left arm weakness, recent left eye surgery, hyperglycemia, evaluate for intracranial hemorrhage EXAM: CT HEAD WITHOUT CONTRAST TECHNIQUE: Contiguous axial images were obtained from the base of the skull through the vertex without intravenous contrast. COMPARISON:  None. FINDINGS: Brain: No evidence of acute infarction, hemorrhage, hydrocephalus, extra-axial collection or mass lesion/mass effect. Subcortical white matter and periventricular small vessel ischemic changes. Vascular: Intracranial atherosclerosis. Skull: Normal. Negative for fracture or  focal lesion. Sinuses/Orbits: Mild partial opacification of the bilateral frontal sinuses (left greater than right) and bilateral ethmoid sinuses. Mastoid air cells are clear. Other: None. IMPRESSION: No evidence of acute intracranial abnormality. Small vessel ischemic changes. Electronically Signed   By: Julian Hy M.D.   On: 11/21/2016 16:39   Mr Jodene Nam Head Wo Contrast  Result Date: 11/21/2016 CLINICAL DATA:  Stroke. Acute onset of left upper and lower extremity weakness yesterday at 12 o'clock noon. Hyperglycemia. Acute brain stem infarct. EXAM: MRA HEAD WITHOUT CONTRAST TECHNIQUE: Angiographic images of the Circle of Willis were obtained using MRA technique without intravenous contrast. COMPARISON:  MRI brain from the same day. FINDINGS: The internal carotid arteries are within normal limits the high cervical segments through the ICA termini bilaterally. The A1 and M1 segments are normal. No definite anterior communicating artery is present. The MCA bifurcations are intact. ACA and MCA branch vessels are within normal limits. The left vertebral artery is the dominant vessel. The right vertebral artery is not visualized. This may be hypoplastic or occluded. The left PICA origin is visualized and normal. The right AICA is dominant. The posterior cerebral arteries originate from the basilar tip. Distal PCA branch vessels are not well seen. IMPRESSION: 1. Nonvisualization of the right vertebral artery. This may represent a hypoplastic or occluded vessel. 2. Dominant left vertebral artery. 3. No other significant proximal stenosis, aneurysm, or branch vessel occlusion. Electronically Signed   By: San Morelle M.D.   On: 11/21/2016 17:46   Mr Brain Wo Contrast  Result Date: 11/21/2016 CLINICAL DATA:  Left arm and leg weakness beginning yesterday at 12 o'clock noon. Hyperglycemia. Recent left eye surgery. EXAM: MRI HEAD WITHOUT CONTRAST TECHNIQUE: Multiplanar, multiecho pulse sequences of the brain  and surrounding structures were obtained without intravenous contrast. COMPARISON:  CT head without contrast from the same day. FINDINGS: Brain: An acute nonhemorrhagic 5 mm infarct is present in the right midbrain, likely impacting the cortical spinal tracts. No other acute infarct is present. Mild atrophy and moderate age advanced white matter disease is present bilaterally. There is a remote lacunar infarct in the left corona radiata. Remote lacunar infarcts are present in the thalami bilaterally. White matter changes extend into the brainstem. A remote  lacunar infarct is present in the right pons. The cerebellum is normal bilaterally. Vascular: Flow is present in the major intracranial arteries. Skull and upper cervical spine: Skullbase is a normal limits. The craniocervical junction is normal. Midline sagittal structures are unremarkable. Sinuses/Orbits: Mild mucosal thickening is present in the maxillary and sinuses and ethmoid air cells, left greater than right. There is partial opacification of the left frontal sinus. Mucosal thickening is present in the right frontal sinus. The sphenoid sinuses are clear bilaterally. Fluid is present in the mastoid air cells bilaterally. No obstructing nasopharyngeal lesion is present. IMPRESSION: 1. Acute 5 mm nonhemorrhagic infarct involving the right midbrain. Given the symptoms, this likely impacts the cortical spinal tracts. 2. Remote lacunar infarct just inferior in the right pons. 3. Remote lacunar infarcts of the thalami bilaterally. 4. Remote lacunar infarct of the left corona radiata. 5. Moderate age advanced white matter disease likely reflects the sequela of chronic microvascular ischemia. These results were called by telephone at the time of interpretation on 11/21/2016 at 5:43 pm to Dr. Roderic Palau , who verbally acknowledged these results. Electronically Signed   By: San Morelle M.D.   On: 11/21/2016 17:44    Procedures Procedures (including critical  care time)  Medications Ordered in ED Medications   stroke: mapping our early stages of recovery book (not administered)  0.9 %  sodium chloride infusion (not administered)  enoxaparin (LOVENOX) injection 40 mg (not administered)  ondansetron (ZOFRAN) tablet 4 mg (not administered)    Or  ondansetron (ZOFRAN) injection 4 mg (not administered)  clopidogrel (PLAVIX) tablet 75 mg (not administered)  lisinopril (PRINIVIL,ZESTRIL) tablet 40 mg (not administered)  pantoprazole (PROTONIX) EC tablet 40 mg (not administered)  latanoprost (XALATAN) 0.005 % ophthalmic solution 1 drop (not administered)  simethicone (MYLICON) chewable tablet 160 mg (not administered)  tamsulosin (FLOMAX) capsule 0.4 mg (not administered)  insulin glargine (LANTUS) injection 20 Units (not administered)  aspirin EC tablet 81 mg (not administered)  atorvastatin (LIPITOR) tablet 40 mg (not administered)  brimonidine (ALPHAGAN) 0.2 % ophthalmic solution 1 drop (not administered)  dorzolamide-timolol (COSOPT) 22.3-6.8 MG/ML ophthalmic solution 1 drop (not administered)  glipiZIDE (GLUCOTROL) tablet 20 mg (not administered)  furosemide (LASIX) tablet 40 mg (not administered)  insulin aspart (novoLOG) injection 0-9 Units (not administered)  polyvinyl alcohol (LIQUIFILM TEARS) 1.4 % ophthalmic solution 1 drop (not administered)  ciprofloxacin (CILOXAN) 0.3 % ophthalmic solution 1 drop (not administered)  ketorolac (ACULAR) 0.5 % ophthalmic solution 1 drop (not administered)  prednisoLONE acetate (PRED FORTE) 1 % ophthalmic suspension 1 drop (not administered)  sodium chloride 0.9 % bolus 1,000 mL (0 mLs Intravenous Stopped 11/21/16 1846)  sodium chloride 0.9 % bolus 1,000 mL (1,000 mLs Intravenous New Bag/Given 11/21/16 1845)  insulin aspart (novoLOG) injection 5 Units (5 Units Intravenous Given 11/21/16 1753)  clopidogrel (PLAVIX) tablet 75 mg (75 mg Oral Given 11/21/16 1854)     Initial Impression / Assessment and Plan /  ED Course  I have reviewed the triage vital signs and the nursing notes.  Pertinent labs & imaging results that were available during my care of the patient were reviewed by me and considered in my medical decision making (see chart for details).    Will eval for stroke. Could also be related to hyperglycemia so will work to improve that as well.   MRI with evidence of acute mid brain infarct. Also multiple remote lacunar infarcts. I discussed with neurologist on call, Dr. Merlene Laughter, he states that since symptoms started  2 days ago the patient was okay to be admitted here and he will consult on the patient after admission to [facility]. Recommended starting plavix. Discussed case with [facility] who will admit.r  Final Clinical Impressions(s) / ED Diagnoses   Final diagnoses:  Stroke (cerebrum) ([place])  Acute CVA (cerebrovascular accident) ([place])  Acute CVA (cerebrovascular accident) [facility])     Merrily Pew, MD 11/21/16 2227

## 2016-11-22 ENCOUNTER — Inpatient Hospital Stay (HOSPITAL_COMMUNITY): Payer: Medicare HMO

## 2016-11-22 DIAGNOSIS — I6350 Cerebral infarction due to unspecified occlusion or stenosis of unspecified cerebral artery: Secondary | ICD-10-CM

## 2016-11-22 DIAGNOSIS — N184 Chronic kidney disease, stage 4 (severe): Secondary | ICD-10-CM

## 2016-11-22 DIAGNOSIS — Z794 Long term (current) use of insulin: Secondary | ICD-10-CM

## 2016-11-22 DIAGNOSIS — E1122 Type 2 diabetes mellitus with diabetic chronic kidney disease: Secondary | ICD-10-CM

## 2016-11-22 DIAGNOSIS — I10 Essential (primary) hypertension: Secondary | ICD-10-CM

## 2016-11-22 DIAGNOSIS — E1142 Type 2 diabetes mellitus with diabetic polyneuropathy: Secondary | ICD-10-CM

## 2016-11-22 LAB — CBC
HEMATOCRIT: 33.1 % — AB (ref 39.0–52.0)
HEMOGLOBIN: 11.1 g/dL — AB (ref 13.0–17.0)
MCH: 29.4 pg (ref 26.0–34.0)
MCHC: 33.5 g/dL (ref 30.0–36.0)
MCV: 87.8 fL (ref 78.0–100.0)
Platelets: 212 10*3/uL (ref 150–400)
RBC: 3.77 MIL/uL — AB (ref 4.22–5.81)
RDW: 13.3 % (ref 11.5–15.5)
WBC: 7.1 10*3/uL (ref 4.0–10.5)

## 2016-11-22 LAB — ECHOCARDIOGRAM COMPLETE
CHL CUP DOP CALC LVOT VTI: 23.8 cm
E/e' ratio: 6.55
EWDT: 222 ms
FS: 33 % (ref 28–44)
HEIGHTINCHES: 72 in
IVS/LV PW RATIO, ED: 1.05
LA diam end sys: 35 mm
LA vol A4C: 40.3 ml
LA vol: 49.9 mL
LADIAMINDEX: 1.59 cm/m2
LASIZE: 35 mm
LAVOLIN: 22.7 mL/m2
LDCA: 2.84 cm2
LV E/e' medial: 6.55
LV E/e'average: 6.55
LV SIMPSON'S DISK: 64
LV TDI E'MEDIAL: 9.25
LV dias vol index: 44 mL/m2
LV e' LATERAL: 10.9 cm/s
LV sys vol index: 16 mL/m2
LVDIAVOL: 97 mL (ref 62–150)
LVOT peak grad rest: 3 mmHg
LVOTD: 19 mm
LVOTPV: 90.6 cm/s
LVOTSV: 68 mL
LVSYSVOL: 35 mL (ref 21–61)
MV Dec: 222
MV Peak grad: 2 mmHg
MV pk A vel: 95.5 m/s
MVPKEVEL: 71.4 m/s
PW: 11.3 mm — AB (ref 0.6–1.1)
RV LATERAL S' VELOCITY: 11.9 cm/s
Stroke v: 62 ml
TAPSE: 21.2 mm
TDI e' lateral: 10.9
WEIGHTICAEL: 3305.14 [oz_av]

## 2016-11-22 LAB — COMPREHENSIVE METABOLIC PANEL
ALBUMIN: 3.1 g/dL — AB (ref 3.5–5.0)
ALK PHOS: 91 U/L (ref 38–126)
ALT: 18 U/L (ref 17–63)
AST: 13 U/L — AB (ref 15–41)
Anion gap: 9 (ref 5–15)
BILIRUBIN TOTAL: 0.2 mg/dL — AB (ref 0.3–1.2)
BUN: 35 mg/dL — ABNORMAL HIGH (ref 6–20)
CO2: 23 mmol/L (ref 22–32)
Calcium: 8.9 mg/dL (ref 8.9–10.3)
Chloride: 108 mmol/L (ref 101–111)
Creatinine, Ser: 1.66 mg/dL — ABNORMAL HIGH (ref 0.61–1.24)
GFR calc Af Amer: 49 mL/min — ABNORMAL LOW (ref >60)
GFR calc non Af Amer: 42 mL/min — ABNORMAL LOW (ref >60)
Glucose, Bld: 118 mg/dL — ABNORMAL HIGH (ref 65–99)
POTASSIUM: 3.9 mmol/L (ref 3.5–5.1)
Sodium: 140 mmol/L (ref 135–145)
TOTAL PROTEIN: 6.2 g/dL — AB (ref 6.5–8.1)

## 2016-11-22 LAB — GLUCOSE, CAPILLARY
Glucose-Capillary: 65 mg/dL (ref 65–99)
Glucose-Capillary: 150 mg/dL — ABNORMAL HIGH (ref 65–99)
Glucose-Capillary: 123 mg/dL — ABNORMAL HIGH (ref 65–99)
Glucose-Capillary: 122 mg/dL — ABNORMAL HIGH (ref 65–99)

## 2016-11-22 LAB — LIPID PANEL
CHOLESTEROL: 121 mg/dL (ref 0–200)
HDL: 31 mg/dL — ABNORMAL LOW (ref >40)
LDL Cholesterol: 66 mg/dL (ref 0–99)
Total CHOL/HDL Ratio: 3.9 RATIO
Triglycerides: 120 mg/dL (ref <150)
VLDL: 24 mg/dL (ref 0–40)

## 2016-11-22 NOTE — Progress Notes (Signed)
PROGRESS NOTE    Trevor Crawford  INO:[id number] DOB: 05-27-1951 DOA: 11/21/2016 PCP: Pcp Not In System    Brief Narrative:  66 year old male with history of chronic kidney disease, previous strokes, presents with left-sided numbness and found to have an acute CVA. He was admitted for further treatments.   Assessment & Plan:   Principal Problem:   Acute CVA (cerebrovascular accident) ([place]) Active Problems:   HTN (hypertension)   BPH (benign prostatic hyperplasia)   T2DM (type 2 diabetes mellitus) ([place])   CKD stage 4 due to type 2 diabetes mellitus ([facility])   Glaucoma   Hyperlipidemia   GERD (gastroesophageal reflux disease)   1. Acute CVA. MRI confirms acute 5 mm nonhemorrhagic infarct involving the right midbrain. He also has remote lacunar infarcts. Patient reports a chronically takes baby aspirin. Plavix has been added to his antiplatelet regimen. Echocardiogram is unremarkable. Carotid Dopplers are also unremarkable. Lipid panel shows an LDL of 66, continue Lipitor. Hemoglobin A1c is in process. Neurology consultation has been requested.  2. Hypertension. Blood pressure is currently stable. Continue current treatments. Holding lisinopril for now.  3. CKD stage III. Creatinine is currently at baseline. Continue to monitor.  4. Diabetes. Had some hypoglycemia this morning Cozaar was held. Continue to monitor blood sugars. He is on Lantus and sliding scale insulin.  5. History of glaucoma with residual right-sided blindness. He reports that he recently had cataract surgery on the left eye and vision is improving in that eye.   DVT prophylaxis: Lovenox Code Status: Full code Family Communication: no family present Disposition Plan: discharge home once improved   Consultants:   Neurology  Procedures:  Echo:- Left ventricle: The cavity size was normal. Wall thickness was   increased in a pattern of mild LVH. Systolic function was normal.   The estimated ejection fraction was  in the range of 60% to 65%.   Wall motion was normal; there were no regional wall motion   abnormalities. Doppler parameters are consistent with abnormal   left ventricular relaxation (grade 1 diastolic dysfunction). - Aortic valve: Valve area (VTI): 3.06 cm^2. Valve area (Vmax):   2.64 cm^2. Valve area (Vmean): 2.83 cm^2.  - Atrial septum: No defect or patent foramen ovale was identified.    Subjective: He feels that left-sided numbness is improving.  Objective: Vitals:   11/22/16 0900 11/22/16 1028 11/22/16 1300 11/22/16 1700  BP: 134/69  (!) 151/86 (!) 177/82  Pulse: 72  69 68  Resp: 16  18 16   Temp: 98.7 F (37.1 C)  97.5 F (36.4 C) 97.7 F (36.5 C)  TempSrc: Oral  Oral Oral  SpO2: 100% 98% 100% 100%  Weight:      Height:        Intake/Output Summary (Last 24 hours) at 11/22/16 1817 Last data filed at 11/22/16 1739  Gross per 24 hour  Intake          1436.25 ml  Output             2775 ml  Net         -1338.75 ml   Filed Weights   11/21/16 1408 11/21/16 2100  Weight: 93 kg (205 lb) 93.7 kg (206 lb 9.1 oz)    Examination:  General exam: Appears calm and comfortable  Respiratory system: Clear to auscultation. Respiratory effort normal. Cardiovascular system: S1 & S2 heard, RRR. No JVD, murmurs, rubs, gallops or clicks. No pedal edema. Gastrointestinal system: Abdomen is nondistended, soft and nontender. No  organomegaly or masses felt. Normal bowel sounds heard. Central nervous system: Alert and oriented. No focal neurological deficits. Extremities: Symmetric 5 x 5 power. Skin: No rashes, lesions or ulcers Psychiatry: Judgement and insight appear normal. Mood & affect appropriate.     Data Reviewed: I have personally reviewed following labs and imaging studies  CBC:  Recent Labs Lab 11/21/16 1441 11/22/16 0444  WBC 7.0 7.1  NEUTROABS 4.6  --   HGB 11.5* 11.1*  HCT 35.1* 33.1*  MCV 88.0 87.8  PLT 196 124   Basic Metabolic Panel:  Recent  Labs Lab 11/21/16 1441 11/22/16 0444  NA 132* 140  K 4.3 3.9  CL 99* 108  CO2 25 23  GLUCOSE 391* 118*  BUN 46* 35*  CREATININE 2.22* 1.66*  CALCIUM 9.4 8.9   GFR: Estimated Creatinine Clearance: 53.4 mL/min (A) (by C-G formula based on SCr of 1.66 mg/dL (H)). Liver Function Tests:  Recent Labs Lab 11/21/16 1441 11/22/16 0444  AST 16 13*  ALT 21 18  ALKPHOS 113 91  BILITOT 0.4 0.2*  PROT 7.0 6.2*  ALBUMIN 3.6 3.1*   No results for input(s): LIPASE, AMYLASE in the last 168 hours. No results for input(s): AMMONIA in the last 168 hours. Coagulation Profile:  Recent Labs Lab 11/21/16 1441  INR 0.90   Cardiac Enzymes:  Recent Labs Lab 11/21/16 1539  TROPONINI <0.03   BNP (last 3 results) No results for input(s): PROBNP in the last 8760 hours. HbA1C: No results for input(s): HGBA1C in the last 72 hours. CBG:  Recent Labs Lab 11/21/16 1407 11/22/16 0801 11/22/16 0901 11/22/16 1104  GLUCAP 379* 65 150* 123*   Lipid Profile:  Recent Labs  11/22/16 0444  CHOL 121  HDL 31*  LDLCALC 66  TRIG 120  CHOLHDL 3.9   Thyroid Function Tests: No results for input(s): TSH, T4TOTAL, FREET4, T3FREE, THYROIDAB in the last 72 hours. Anemia Panel: No results for input(s): VITAMINB12, FOLATE, FERRITIN, TIBC, IRON, RETICCTPCT in the last 72 hours. Sepsis Labs: No results for input(s): PROCALCITON, LATICACIDVEN in the last 168 hours.  No results found for this or any previous visit (from the past 240 hour(s)).       Radiology Studies: Dg Chest 2 View  Result Date: 11/21/2016 CLINICAL DATA:  Acute CVA. EXAM: CHEST  2 VIEW COMPARISON:  None. FINDINGS: The cardiomediastinal contours are normal. The lungs are clear. Pulmonary vasculature is normal. No consolidation, pleural effusion, or pneumothorax. No acute osseous abnormalities are seen. Mild degenerative change in the spine. IMPRESSION: No acute abnormality. Electronically Signed   By: Jeb Levering M.D.    On: 11/21/2016 23:19   Ct Head Wo Contrast  Result Date: 11/21/2016 CLINICAL DATA:  Left arm weakness, recent left eye surgery, hyperglycemia, evaluate for intracranial hemorrhage EXAM: CT HEAD WITHOUT CONTRAST TECHNIQUE: Contiguous axial images were obtained from the base of the skull through the vertex without intravenous contrast. COMPARISON:  None. FINDINGS: Brain: No evidence of acute infarction, hemorrhage, hydrocephalus, extra-axial collection or mass lesion/mass effect. Subcortical white matter and periventricular small vessel ischemic changes. Vascular: Intracranial atherosclerosis. Skull: Normal. Negative for fracture or focal lesion. Sinuses/Orbits: Mild partial opacification of the bilateral frontal sinuses (left greater than right) and bilateral ethmoid sinuses. Mastoid air cells are clear. Other: None. IMPRESSION: No evidence of acute intracranial abnormality. Small vessel ischemic changes. Electronically Signed   By: Julian Hy M.D.   On: 11/21/2016 16:39   Mr Jodene Nam Head Wo Contrast  Result Date: 11/21/2016 CLINICAL  DATA:  Stroke. Acute onset of left upper and lower extremity weakness yesterday at 12 o'clock noon. Hyperglycemia. Acute brain stem infarct. EXAM: MRA HEAD WITHOUT CONTRAST TECHNIQUE: Angiographic images of the Circle of Willis were obtained using MRA technique without intravenous contrast. COMPARISON:  MRI brain from the same day. FINDINGS: The internal carotid arteries are within normal limits the high cervical segments through the ICA termini bilaterally. The A1 and M1 segments are normal. No definite anterior communicating artery is present. The MCA bifurcations are intact. ACA and MCA branch vessels are within normal limits. The left vertebral artery is the dominant vessel. The right vertebral artery is not visualized. This may be hypoplastic or occluded. The left PICA origin is visualized and normal. The right AICA is dominant. The posterior cerebral arteries originate  from the basilar tip. Distal PCA branch vessels are not well seen. IMPRESSION: 1. Nonvisualization of the right vertebral artery. This may represent a hypoplastic or occluded vessel. 2. Dominant left vertebral artery. 3. No other significant proximal stenosis, aneurysm, or branch vessel occlusion. Electronically Signed   By: San Morelle M.D.   On: 11/21/2016 17:46   Mr Brain Wo Contrast  Result Date: 11/21/2016 CLINICAL DATA:  Left arm and leg weakness beginning yesterday at 12 o'clock noon. Hyperglycemia. Recent left eye surgery. EXAM: MRI HEAD WITHOUT CONTRAST TECHNIQUE: Multiplanar, multiecho pulse sequences of the brain and surrounding structures were obtained without intravenous contrast. COMPARISON:  CT head without contrast from the same day. FINDINGS: Brain: An acute nonhemorrhagic 5 mm infarct is present in the right midbrain, likely impacting the cortical spinal tracts. No other acute infarct is present. Mild atrophy and moderate age advanced white matter disease is present bilaterally. There is a remote lacunar infarct in the left corona radiata. Remote lacunar infarcts are present in the thalami bilaterally. White matter changes extend into the brainstem. A remote lacunar infarct is present in the right pons. The cerebellum is normal bilaterally. Vascular: Flow is present in the major intracranial arteries. Skull and upper cervical spine: Skullbase is a normal limits. The craniocervical junction is normal. Midline sagittal structures are unremarkable. Sinuses/Orbits: Mild mucosal thickening is present in the maxillary and sinuses and ethmoid air cells, left greater than right. There is partial opacification of the left frontal sinus. Mucosal thickening is present in the right frontal sinus. The sphenoid sinuses are clear bilaterally. Fluid is present in the mastoid air cells bilaterally. No obstructing nasopharyngeal lesion is present. IMPRESSION: 1. Acute 5 mm nonhemorrhagic infarct  involving the right midbrain. Given the symptoms, this likely impacts the cortical spinal tracts. 2. Remote lacunar infarct just inferior in the right pons. 3. Remote lacunar infarcts of the thalami bilaterally. 4. Remote lacunar infarct of the left corona radiata. 5. Moderate age advanced white matter disease likely reflects the sequela of chronic microvascular ischemia. These results were called by telephone at the time of interpretation on 11/21/2016 at 5:43 pm to Dr. Roderic Palau , who verbally acknowledged these results. Electronically Signed   By: San Morelle M.D.   On: 11/21/2016 17:44   US Carotid Bilateral (at [facility] And [facility] Only)  Result Date: 11/22/2016 CLINICAL DATA:  CVA. EXAM: BILATERAL CAROTID DUPLEX ULTRASOUND TECHNIQUE: Pearline Cables scale imaging, color Doppler and duplex ultrasound were performed of bilateral carotid and vertebral arteries in the neck. COMPARISON:  MRI 11/21/2016. FINDINGS: Criteria: Quantification of carotid stenosis is based on velocity parameters that correlate the residual internal carotid diameter with NASCET-based stenosis levels, using the diameter of the distal  internal carotid lumen as the denominator for stenosis measurement. The following velocity measurements were obtained: RIGHT ICA:  90/32 cm/sec CCA:  65/78 cm/sec SYSTOLIC ICA/CCA RATIO:  1.2 DIASTOLIC ICA/CCA RATIO:  2.9 ECA:  141 cm/sec LEFT ICA:  93/25 cm/sec CCA:   46/96 cm/sec SYSTOLIC ICA/CCA RATIO:  1.0 DIASTOLIC ICA/CCA RATIO:  1.2 ECA:  95 cm/sec RIGHT CAROTID ARTERY: Mild right common carotid, carotid bifurcation, ICA atherosclerotic vascular disease. No flow limiting stenosis. Degree of stenosis less than 50% . RIGHT VERTEBRAL ARTERY:  Patent with antegrade flow. LEFT CAROTID ARTERY: Mild left common carotid, carotid bifurcation, ICA atherosclerotic vascular disease. No flow limiting stenosis. Degree of stenosis less than 50%. LEFT VERTEBRAL ARTERY:  Patent with antegrade flow . IMPRESSION: 1. Mild bilateral  common carotid, carotid bifurcation, proximal ICA atherosclerotic vascular disease. No flow limiting stenosis. Degree of stenosis less than 50%. 2. Vertebrals are patent with antegrade flow. Electronically Signed   By: Marcello Moores  Register   On: 11/22/2016 10:19        Scheduled Meds: .  stroke: mapping our early stages of recovery book   Does not apply Once  . aspirin EC  81 mg Oral Daily  . atorvastatin  40 mg Oral QHS  . brimonidine  1 drop Both Eyes BID  . ciprofloxacin  1 drop Left Eye QID  . clopidogrel  75 mg Oral Daily  . dorzolamide-timolol  1 drop Both Eyes BID  . enoxaparin (LOVENOX) injection  40 mg Subcutaneous Q24H  . furosemide  40 mg Oral q morning - 10a  . insulin aspart  0-9 Units Subcutaneous TID WC  . insulin glargine  20 Units Subcutaneous Daily  . ketorolac  1 drop Left Eye QID  . latanoprost  1 drop Both Eyes QHS  . pantoprazole  40 mg Oral Daily  . prednisoLONE acetate  1 drop Left Eye Q1H while awake  . tamsulosin  0.4 mg Oral Daily   Continuous Infusions: . sodium chloride 75 mL/hr at 11/21/16 2228     LOS: 1 day    Time spent: 18mins    MEMON,JEHANZEB, MD [facility] Pager [phone]  If 7PM-7AM, please contact night-coverage [email or web address] Password [facility] 11/22/2016, 6:17 PM

## 2016-11-22 NOTE — Consult Note (Signed)
[place] A. Merlene Laughter, MD     [email or web address]          Trevor Crawford is an 66 y.o. male.   ASSESSMENT/PLAN: 1. Acute right midbrain lacunar infarct: Risk factors age, diabetes, hypertension and dyslipidemia. The patient aspirin will be increased to 325. We can discontinue the patient's Plavix on discharge. Continue with statin medication. Smoking cessation also is suggested.    The patient is a 66 year old white male who developed the acute onset of numbness and tingling involving the left hand and left facial region. The patient reports that he did not have dysarthria or dysphagia. He denies dizziness or visual obscuration. He recently underwent cataract extraction procedure implantation about 6 days ago involving the left eye. He is buying on the right eye due to stigmata of glaucoma. Patient does not report having weakness of the hand. The right side is a symptomatically. He does have some numbness and tingling and gait instability of his legs due to the combination of diabetes. He tells me that this did not change during his acute episode. His symptoms continued although he reports some improvement. He denies chest pain, shortness of breath, GI GU symptoms. The review systems otherwise negative.    GENERAL: Pleasant thin male in no acute distress.  HEENT: The right orbits is red and the cornea significantly clouded.  ABDOMEN: soft  EXTREMITIES: No edema   BACK: Normal  SKIN: Normal by inspection.    MENTAL STATUS: Alert and oriented - including orientation to age and the month. Speech, language and cognition are generally intact. Judgment and insight normal.   CRANIAL NERVES: Pupil nonreactive on the right again with significant corneal clouding, left 4 mm and reactive;  extra ocular movements are full, there is no significant nystagmus; visual fields are full; upper and lower facial muscles are normal in strength and symmetric, there is no flattening of  the nasolabial folds; tongue is midline; uvula is midline; shoulder elevation is normal.   MOTOR: Normal tone, bulk and strength; no pronator drift. No drift of the upper lower extremities.  COORDINATION: Left finger to nose is normal, right finger to nose is normal, No rest tremor; no intention tremor; no postural tremor; no bradykinesia.  REFLEXES: Deep tendon reflexes are symmetrical and normal. Babinski reflexes are flexor bilaterally.   SENSATION: There is reduced sensation to temperature light touch involving the left upper extremity and left facial region lower aspect. The patient does not extinguish to double simultaneous tactile or visual stimulation.  NIH stroke scale 2.     Blood pressure (!) 177/82, pulse 68, temperature 97.7 F (36.5 C), temperature source Oral, resp. rate 16, height 6' (1.829 m), weight 206 lb 9.1 oz (93.7 kg), SpO2 100 %.  Past Medical History:  Diagnosis Date  . Arthritis   . Cataract    bilateral  . Chronic kidney disease   . Depression   . Diabetes mellitus without complication ([place])   . GERD (gastroesophageal reflux disease)   . Glaucoma   . Hyperlipidemia   . Hypertension   . Legally blind in right eye, as defined in [place]   . Neuromuscular disorder ([place])   . Neuropathy     Past Surgical History:  Procedure Laterality Date  . CATARACT EXTRACTION Left   . CYSTOSCOPY WITH RETROGRADE PYELOGRAM, URETEROSCOPY AND STENT PLACEMENT Left 10/06/2015   Procedure: CYSTOSCOPY WITH RETROGRADE PYELOGRAM, AND LEFT STENT PLACEMENT ;  Surgeon: Cleon Gustin, MD;  Location: [facility];  Service: Urology;  Laterality: Left;    Family History  Problem Relation Age of Onset  . Cancer Mother   . Alcoholism Father   . Cancer Father   . Cancer Other   . Cancer Sister   . Diabetes Brother   . Stroke Brother     Social History:  reports that he has been smoking Cigarettes.  He has a 55.00 pack-year smoking history. He has never used smokeless tobacco. He  reports that he does not drink alcohol or use drugs.  Allergies: No Known Allergies  Medications: Prior to Admission medications   Medication Sig Start Date End Date Taking? Authorizing Provider  aspirin EC 81 MG tablet Take 81 mg by mouth daily.   Yes Historical Provider, MD  atorvastatin (LIPITOR) 80 MG tablet Take 40 mg by mouth at bedtime.   Yes Historical Provider, MD  brimonidine (ALPHAGAN) 0.2 % ophthalmic solution Place 1 drop into both eyes 2 (two) times daily.   Yes Historical Provider, MD  carboxymethylcellulose (REFRESH PLUS) 0.5 % SOLN Apply 1 drop to eye 4 (four) times daily.   Yes Historical Provider, MD  ciprofloxacin (CILOXAN) 0.3 % ophthalmic solution Place 1 drop into the left eye 4 (four) times daily. Administer 1 drop, every 2 hours, while awake, for 2 days. Then 1 drop, every 4 hours, while awake, for the next 5 days.   Yes Historical Provider, MD  dorzolamide-timolol (COSOPT) 22.3-6.8 MG/ML ophthalmic solution 1 drop 2 (two) times daily.   Yes Historical Provider, MD  furosemide (LASIX) 40 MG tablet Take 40 mg by mouth every morning.   Yes Historical Provider, MD  glipiZIDE (GLUCOTROL) 10 MG tablet Take 20 mg by mouth 2 (two) times daily.   Yes Historical Provider, MD  Insulin Glargine (LANTUS SOLOSTAR) 100 UNIT/ML Solostar Pen Inject 20 Units into the skin daily.    Yes Historical Provider, MD  ketorolac (ACULAR) 0.5 % ophthalmic solution Place 1 drop into the left eye 4 (four) times daily.   Yes Historical Provider, MD  latanoprost (XALATAN) 0.005 % ophthalmic solution Place 1 drop into both eyes at bedtime.   Yes Historical Provider, MD  lisinopril (PRINIVIL,ZESTRIL) 40 MG tablet Take 40 mg by mouth daily.   Yes Historical Provider, MD  omeprazole (PRILOSEC) 20 MG capsule Take 20 mg by mouth daily.   Yes Historical Provider, MD  prednisoLONE acetate (PRED FORTE) 1 % ophthalmic suspension Place 1 drop into the left eye as directed.   Yes Historical Provider, MD    simethicone (MYLICON) 80 MG chewable tablet Chew 160 mg by mouth 2 (two) times daily as needed for flatulence.   Yes Historical Provider, MD  tamsulosin (FLOMAX) 0.4 MG CAPS capsule Take 1 capsule (0.4 mg total) by mouth daily. 11/07/15  Yes Ezequiel Essex, MD    Scheduled Meds: .  stroke: mapping our early stages of recovery book   Does not apply Once  . aspirin EC  81 mg Oral Daily  . atorvastatin  40 mg Oral QHS  . brimonidine  1 drop Both Eyes BID  . ciprofloxacin  1 drop Left Eye QID  . clopidogrel  75 mg Oral Daily  . dorzolamide-timolol  1 drop Both Eyes BID  . enoxaparin (LOVENOX) injection  40 mg Subcutaneous Q24H  . furosemide  40 mg Oral q morning - 10a  . insulin aspart  0-9 Units Subcutaneous TID WC  . insulin glargine  20 Units Subcutaneous Daily  . ketorolac  1 drop Left Eye QID  .  latanoprost  1 drop Both Eyes QHS  . pantoprazole  40 mg Oral Daily  . prednisoLONE acetate  1 drop Left Eye Q1H while awake  . tamsulosin  0.4 mg Oral Daily   Continuous Infusions: . sodium chloride 75 mL/hr at 11/21/16 2228   PRN Meds:.ondansetron **OR** ondansetron (ZOFRAN) IV, polyvinyl alcohol, simethicone     Results for orders placed or performed during the hospital encounter of 11/21/16 (from the past 48 hour(s))  CBG monitoring, ED     Status: Abnormal   Collection Time: 11/21/16  2:07 PM  Result Value Ref Range   Glucose-Capillary 379 (H) 65 - 99 mg/dL  Urine rapid drug screen (hosp performed)not at [facility]     Status: None   Collection Time: 11/21/16  2:24 PM  Result Value Ref Range   Opiates NONE DETECTED NONE DETECTED   Cocaine NONE DETECTED NONE DETECTED   Benzodiazepines NONE DETECTED NONE DETECTED   Amphetamines NONE DETECTED NONE DETECTED   Tetrahydrocannabinol NONE DETECTED NONE DETECTED   Barbiturates NONE DETECTED NONE DETECTED    Comment:        DRUG SCREEN FOR MEDICAL PURPOSES ONLY.  IF CONFIRMATION IS NEEDED FOR ANY PURPOSE, NOTIFY LAB WITHIN 5 DAYS.         LOWEST DETECTABLE LIMITS FOR URINE DRUG SCREEN Drug Class       Cutoff (ng/mL) Amphetamine      1000 Barbiturate      200 Benzodiazepine   735 Tricyclics       329 Opiates          300 Cocaine          300 THC              50   Urinalysis, Routine w reflex microscopic     Status: Abnormal   Collection Time: 11/21/16  2:24 PM  Result Value Ref Range   Color, Urine YELLOW YELLOW   APPearance CLEAR CLEAR   Specific Gravity, Urine 1.010 1.005 - 1.030   pH 5.0 5.0 - 8.0   Glucose, UA >=500 (A) NEGATIVE mg/dL   Hgb urine dipstick NEGATIVE NEGATIVE   Bilirubin Urine NEGATIVE NEGATIVE   Ketones, ur NEGATIVE NEGATIVE mg/dL   Protein, ur 30 (A) NEGATIVE mg/dL   Nitrite NEGATIVE NEGATIVE   Leukocytes, UA NEGATIVE NEGATIVE   RBC / HPF 0-5 0 - 5 RBC/hpf   WBC, UA 0-5 0 - 5 WBC/hpf   Bacteria, UA NONE SEEN NONE SEEN   Squamous Epithelial / LPF NONE SEEN NONE SEEN  Ethanol     Status: None   Collection Time: 11/21/16  2:41 PM  Result Value Ref Range   Alcohol, Ethyl (B) <5 <5 mg/dL    Comment:        LOWEST DETECTABLE LIMIT FOR SERUM ALCOHOL IS 5 mg/dL FOR MEDICAL PURPOSES ONLY   Protime-INR     Status: None   Collection Time: 11/21/16  2:41 PM  Result Value Ref Range   Prothrombin Time 12.1 11.4 - 15.2 seconds   INR 0.90   APTT     Status: None   Collection Time: 11/21/16  2:41 PM  Result Value Ref Range   aPTT 33 24 - 36 seconds  CBC     Status: Abnormal   Collection Time: 11/21/16  2:41 PM  Result Value Ref Range   WBC 7.0 4.0 - 10.5 K/uL   RBC 3.99 (L) 4.22 - 5.81 MIL/uL   Hemoglobin 11.5 (L) 13.0 - 17.0  g/dL   HCT 35.1 (L) 39.0 - 52.0 %   MCV 88.0 78.0 - 100.0 fL   MCH 28.8 26.0 - 34.0 pg   MCHC 32.8 30.0 - 36.0 g/dL   RDW 12.8 11.5 - 15.5 %   Platelets 196 150 - 400 K/uL  Differential     Status: None   Collection Time: 11/21/16  2:41 PM  Result Value Ref Range   Neutrophils Relative % 66 %   Neutro Abs 4.6 1.7 - 7.7 K/uL   Lymphocytes Relative 18 %   Lymphs  Abs 1.3 0.7 - 4.0 K/uL   Monocytes Relative 10 %   Monocytes Absolute 0.7 0.1 - 1.0 K/uL   Eosinophils Relative 6 %   Eosinophils Absolute 0.5 0.0 - 0.7 K/uL   Basophils Relative 0 %   Basophils Absolute 0.0 0.0 - 0.1 K/uL  Comprehensive metabolic panel     Status: Abnormal   Collection Time: 11/21/16  2:41 PM  Result Value Ref Range   Sodium 132 (L) 135 - 145 mmol/L   Potassium 4.3 3.5 - 5.1 mmol/L   Chloride 99 (L) 101 - 111 mmol/L   CO2 25 22 - 32 mmol/L   Glucose, Bld 391 (H) 65 - 99 mg/dL   BUN 46 (H) 6 - 20 mg/dL   Creatinine, Ser 2.22 (H) 0.61 - 1.24 mg/dL   Calcium 9.4 8.9 - 10.3 mg/dL   Total Protein 7.0 6.5 - 8.1 g/dL   Albumin 3.6 3.5 - 5.0 g/dL   AST 16 15 - 41 U/L   ALT 21 17 - 63 U/L   Alkaline Phosphatase 113 38 - 126 U/L   Total Bilirubin 0.4 0.3 - 1.2 mg/dL   GFR calc non Af Amer 30 (L) >60 mL/min   GFR calc Af Amer 34 (L) >60 mL/min    Comment: (NOTE) The eGFR has been calculated using the CKD EPI equation. This calculation has not been validated in all clinical situations. eGFR's persistently <60 mL/min signify possible Chronic Kidney Disease.    Anion gap 8 5 - 15  Troponin I     Status: None   Collection Time: 11/21/16  3:39 PM  Result Value Ref Range   Troponin I <0.03 <0.03 ng/mL  Lipid panel     Status: Abnormal   Collection Time: 11/22/16  4:44 AM  Result Value Ref Range   Cholesterol 121 0 - 200 mg/dL   Triglycerides 120 <150 mg/dL   HDL 31 (L) >40 mg/dL   Total CHOL/HDL Ratio 3.9 RATIO   VLDL 24 0 - 40 mg/dL   LDL Cholesterol 66 0 - 99 mg/dL    Comment:        Total Cholesterol/HDL:CHD Risk Coronary Heart Disease Risk Table                     Men   Women  1/2 Average Risk   3.4   3.3  Average Risk       5.0   4.4  2 X Average Risk   9.6   7.1  3 X Average Risk  23.4   11.0        Use the calculated Patient Ratio above and the CHD Risk Table to determine the patient's CHD Risk.        ATP III CLASSIFICATION (LDL):  <100     mg/dL    Optimal  100-129  mg/dL   Near or Above  Optimal  130-159  mg/dL   Borderline  160-189  mg/dL   High  >190     mg/dL   Very High   CBC     Status: Abnormal   Collection Time: 11/22/16  4:44 AM  Result Value Ref Range   WBC 7.1 4.0 - 10.5 K/uL   RBC 3.77 (L) 4.22 - 5.81 MIL/uL   Hemoglobin 11.1 (L) 13.0 - 17.0 g/dL   HCT 33.1 (L) 39.0 - 52.0 %   MCV 87.8 78.0 - 100.0 fL   MCH 29.4 26.0 - 34.0 pg   MCHC 33.5 30.0 - 36.0 g/dL   RDW 13.3 11.5 - 15.5 %   Platelets 212 150 - 400 K/uL  Comprehensive metabolic panel     Status: Abnormal   Collection Time: 11/22/16  4:44 AM  Result Value Ref Range   Sodium 140 135 - 145 mmol/L    Comment: DELTA CHECK NOTED   Potassium 3.9 3.5 - 5.1 mmol/L   Chloride 108 101 - 111 mmol/L   CO2 23 22 - 32 mmol/L   Glucose, Bld 118 (H) 65 - 99 mg/dL   BUN 35 (H) 6 - 20 mg/dL   Creatinine, Ser 1.66 (H) 0.61 - 1.24 mg/dL   Calcium 8.9 8.9 - 10.3 mg/dL   Total Protein 6.2 (L) 6.5 - 8.1 g/dL   Albumin 3.1 (L) 3.5 - 5.0 g/dL   AST 13 (L) 15 - 41 U/L   ALT 18 17 - 63 U/L   Alkaline Phosphatase 91 38 - 126 U/L   Total Bilirubin 0.2 (L) 0.3 - 1.2 mg/dL   GFR calc non Af Amer 42 (L) >60 mL/min   GFR calc Af Amer 49 (L) >60 mL/min    Comment: (NOTE) The eGFR has been calculated using the CKD EPI equation. This calculation has not been validated in all clinical situations. eGFR's persistently <60 mL/min signify possible Chronic Kidney Disease.    Anion gap 9 5 - 15  Glucose, capillary     Status: None   Collection Time: 11/22/16  8:01 AM  Result Value Ref Range   Glucose-Capillary 65 65 - 99 mg/dL   Comment 1 Notify RN    Comment 2 Document in Chart   Glucose, capillary     Status: Abnormal   Collection Time: 11/22/16  9:01 AM  Result Value Ref Range   Glucose-Capillary 150 (H) 65 - 99 mg/dL  Glucose, capillary     Status: Abnormal   Collection Time: 11/22/16 11:04 AM  Result Value Ref Range   Glucose-Capillary 123 (H) 65 - 99  mg/dL   Comment 1 Notify RN    Comment 2 Document in Chart     Studies/Results:   ECHO - Left ventricle: The cavity size was normal. Wall thickness was   increased in a pattern of mild LVH. Systolic function was normal.   The estimated ejection fraction was in the range of 60% to 65%.   Wall motion was normal; there were no regional wall motion   abnormalities. Doppler parameters are consistent with abnormal   left ventricular relaxation (grade 1 diastolic dysfunction). - Aortic valve: Valve area (VTI): 3.06 cm^2. Valve area (Vmax):   2.64 cm^2. Valve area (Vmean): 2.83 cm^2. - Atrial septum: No defect or patent foramen ovale was identified.   CAROTID DOPPLERS IMPRESSION: 1. Mild bilateral common carotid, carotid bifurcation, proximal ICA atherosclerotic vascular disease. No flow limiting stenosis. Degree of stenosis less than 50%.  2. Vertebrals are  patent with antegrade flow.     BRAIN MRA FINDINGS: The internal carotid arteries are within normal limits the high cervical segments through the ICA termini bilaterally. The A1 and M1 segments are normal. No definite anterior communicating artery is present. The MCA bifurcations are intact. ACA and MCA branch vessels are within normal limits.  The left vertebral artery is the dominant vessel. The right vertebral artery is not visualized. This may be hypoplastic or occluded. The left PICA origin is visualized and normal. The right AICA is dominant. The posterior cerebral arteries originate from the basilar tip. Distal PCA branch vessels are not well seen.  IMPRESSION: 1. Nonvisualization of the right vertebral artery. This may represent a hypoplastic or occluded vessel. 2. Dominant left vertebral artery. 3. No other significant proximal stenosis, aneurysm, or branch vessel occlusion.     BRAIN MRI FINDINGS: Brain: An acute nonhemorrhagic 5 mm infarct is present in the right midbrain, likely impacting the  cortical spinal tracts. No other acute infarct is present.  Mild atrophy and moderate age advanced white matter disease is present bilaterally. There is a remote lacunar infarct in the left corona radiata. Remote lacunar infarcts are present in the thalami bilaterally. White matter changes extend into the brainstem. A remote lacunar infarct is present in the right pons. The cerebellum is normal bilaterally.  Vascular: Flow is present in the major intracranial arteries.  Skull and upper cervical spine: Skullbase is a normal limits. The craniocervical junction is normal. Midline sagittal structures are unremarkable.  Sinuses/Orbits: Mild mucosal thickening is present in the maxillary and sinuses and ethmoid air cells, left greater than right. There is partial opacification of the left frontal sinus. Mucosal thickening is present in the right frontal sinus. The sphenoid sinuses are clear bilaterally. Fluid is present in the mastoid air cells bilaterally. No obstructing nasopharyngeal lesion is present.  IMPRESSION: 1. Acute 5 mm nonhemorrhagic infarct involving the right midbrain. Given the symptoms, this likely impacts the cortical spinal tracts. 2. Remote lacunar infarct just inferior in the right pons. 3. Remote lacunar infarcts of the thalami bilaterally. 4. Remote lacunar infarct of the left corona radiata. 5. Moderate age advanced white matter disease likely reflects the sequela of chronic microvascular ischemia. These results were called by telephone at the time of interpretation on 11/21/2016 at 5:43 pm to Dr. Roderic Palau , who verbally acknowledged these results.             Quintavius Niebuhr A. Merlene Laughter, M.D.  Diplomate, Tax adviser of Psychiatry and Neurology ( Neurology). 11/22/2016, 7:10 PM

## 2016-11-22 NOTE — Evaluation (Signed)
Physical Therapy Evaluation Patient Details Name: Alias Villagran Cory MRN: [id number] DOB: June 26, 1951 Today's Date: 11/22/2016   History of Present Illness  66 y.o. male with medical history significant of osteoarthritis, cataracts, chronic kidney disease, depression, type 2 diabetes, GERD, glaucoma, hyperlipidemia, hypertension, legally blind right eye, peripheral neuropathy who is coming to the emergency department with complaints of left-sided numbness and mild weakness since yesterday.  MRI (+) for Acute 5 mm nonhemorrhagic infarct involving the right midbrain.  Clinical Impression  Pt received in bed, and was agreeable to PT evaluation.  Pt expressed that he is mostly a household ambulator due to vision loss, but he is normally independent with both dressing and bathing.  Over this last week, his wife has been helping him with socks and shoes due to restrictions from cataract surgery where he is not allowed to have his head lower than his waist.  During PT evaluation, he demonstrates bed mobility independently.  He required min guard for sit<>stand with cane, and min guard for ambulation x 147ft with cane - assistance was required due to pt being blind and navigating a new environment.  Pt expressed that his mobility and balance are at baseline.  Pt does not demonstrate any further need for skilled PT, and therefore, will sign off.      Follow Up Recommendations No PT follow up    Equipment Recommendations  None recommended by PT    Recommendations for Other Services       Precautions / Restrictions Precautions Precautions: Fall Precaution Comments: 1 fall when he slipped on the steps.   Restrictions Weight Bearing Restrictions: No      Mobility  Bed Mobility Overal bed mobility: Independent                Transfers Overall transfer level: Needs assistance Equipment used: Straight cane Transfers: Sit to/from Stand Sit to Stand: Supervision;Min guard             Ambulation/Gait Ambulation/Gait assistance: Supervision;Min guard Ambulation Distance (Feet): 120 Feet Assistive device: Straight cane Gait Pattern/deviations: Decreased step length - right;Decreased step length - left   Gait velocity interpretation: <1.8 ft/sec, indicative of risk for recurrent falls General Gait Details: pt reaches out for objects to stabilize himself on during ambulation.  Pt states it is because of being in an unfamiliarm environment.  Pt states he ambulates slowly due to being cautions with dimished vision.   Stairs            Wheelchair Mobility    Modified Rankin (Stroke Patients Only)       Balance Overall balance assessment: History of Falls;Needs assistance Sitting-balance support: No upper extremity supported;Feet supported Sitting balance-Leahy Scale: Normal     Standing balance support: Single extremity supported Standing balance-Leahy Scale: Fair Standing balance comment: use of cane                             Pertinent Vitals/Pain Pain Assessment: No/denies pain    Home Living   Living Arrangements: Spouse/significant other Available Help at Discharge: Available 24 hours/day Type of Home: Apartment Home Access: Level entry     Home Layout: One level Home Equipment: Cane - single point      Prior Function     Gait / Transfers Assistance Needed: Pt uses cane for ambulation.  Pt states he does not go out into the community due to being blind and just got his eyesight back last  friday after the operation.    ADL's / Homemaking Assistance Needed: inependent with both dressing and bathing.          Hand Dominance   Dominant Hand: Right    Extremity/Trunk Assessment   Upper Extremity Assessment Upper Extremity Assessment: Overall WFL for tasks assessed    Lower Extremity Assessment Lower Extremity Assessment: Overall WFL for tasks assessed       Communication      Cognition Arousal/Alertness:  Awake/alert Behavior During Therapy: WFL for tasks assessed/performed Overall Cognitive Status: Within Functional Limits for tasks assessed                                        General Comments      Exercises     Assessment/Plan    PT Assessment Patent does not need any further PT services  PT Problem List         PT Treatment Interventions      PT Goals (Current goals can be found in the Care Plan section)  Acute Rehab PT Goals Patient Stated Goal: To go home PT Goal Formulation: With patient Time For Goal Achievement: 11/29/16 Potential to Achieve Goals: Good    Frequency     Barriers to discharge        Co-evaluation               End of Session Equipment Utilized During Treatment: Gait belt Activity Tolerance: Patient tolerated treatment well Patient left: in chair;with call bell/phone within reach Nurse Communication: Mobility status (mobility sheet left in the room. ) PT Visit Diagnosis: Unsteadiness on feet (R26.81)    Time: 2025-4270 PT Time Calculation (min) (ACUTE ONLY): 23 min   Charges:   PT Evaluation $PT Eval Low Complexity: 1 Procedure PT Treatments $Gait Training: 8-22 mins   PT G Codes:   PT G-Codes **NOT FOR INPATIENT CLASS** Functional Assessment Tool Used: AM-PAC 6 Clicks Basic Mobility Functional Limitation: Mobility: Walking and moving around Mobility: Walking and Moving Around Current Status (W2376): At least 20 percent but less than 40 percent impaired, limited or restricted Mobility: Walking and Moving Around Goal Status [phone]): At least 20 percent but less than 40 percent impaired, limited or restricted Mobility: Walking and Moving Around Discharge Status [phone]): At least 20 percent but less than 40 percent impaired, limited or restricted    Beth Keiland Pickering, PT, DPT X: [phone]

## 2016-11-22 NOTE — Progress Notes (Signed)
SLP Cancellation Note  Patient Details Name: Randale Carvalho Chagnon MRN: [id number] DOB: Mar 08, 1951   Cancelled treatment:       Reason Eval/Treat Not Completed: SLP screened, no needs identified, will sign off. SLP screened Pt in room. Pt denies any changes in swallowing, speech, language, or cognition. SLE will be deferred at this time. Reconsult if indicated. SLP will sign off.  Thank you,  Genene Churn, [place]   [place] 11/22/2016, 7:34 PM

## 2016-11-22 NOTE — Progress Notes (Signed)
*  PRELIMINARY RESULTS* Echocardiogram 2D Echocardiogram has been performed.  Samuel Germany 11/22/2016, 3:07 PM

## 2016-11-22 NOTE — Care Management Note (Signed)
Case Management Note  Patient Details  Name: Trevor Crawford MRN: [id number] Date of Birth: Oct 22, 1950  Subjective/Objective:                  Pt admitted with CVA. Pt from home with wife. He is ind with ADL's. He has PCP, transportation to appointments. He uses cane with ambulation. He has insurance with drug coverage and has no difficulty affording or managing medications. No needs communicated.  Action/Plan: Anticipate DC home with self care.   Expected Discharge Date:    11/23/2016              Expected Discharge Plan:  Home/Self Care  In-House Referral:  NA  Discharge planning Services  CM Consult  Post Acute Care Choice:  NA Choice offered to:  NA  Status of Service:  Completed, signed off  Sherald Barge, RN 11/22/2016, 1:32 PM

## 2016-11-22 NOTE — Progress Notes (Signed)
OT Screen Note  Patient Details Name: Trevor Crawford MRN: [id number] DOB: 1951-06-10   Cancelled Treatment:    Reason Eval/Treat Not Completed: OT screened, no needs identified, will sign off    Ailene Ravel, OTR/L,CBIS  [phone]  11/22/2016, 1:12 PM

## 2016-11-22 NOTE — Progress Notes (Signed)
Inpatient Diabetes Program Recommendations  AACE/ADA: New Consensus Statement on Inpatient Glycemic Control (2015)  Target Ranges:  Prepandial:   less than 140 mg/dL      Peak postprandial:   less than 180 mg/dL (1-2 hours)      Critically ill patients:  140 - 180 mg/dL   Results for DMANI, MIZER (MRN [id number]) as of 11/22/2016 08:35  Ref. Range 11/21/2016 14:07 11/22/2016 08:01  Glucose-Capillary Latest Ref Range: 65 - 99 mg/dL 379 (H) 65   Review of Glycemic Control  Diabetes history: DM2 Outpatient Diabetes medications: Lantus 20 units daily, Glipizide 20 mg BID Current orders for Inpatient glycemic control: Lantus 20 units daily, Glipizide 20 mg BID,  Novolog 0-9 units TID with meals  Inpatient Diabetes Program Recommendations: Insulin-Correction: Please consider ordering Novolog 0-5 units for bedtime correction. Oral Agents: Fasting glucose 65 mg/dl this morning. Please discontinue Glipizide while inpatient.  Thanks, Barnie Alderman, RN, MSN, CDE Diabetes Coordinator Inpatient Diabetes Program [phone] (Team Pager from 8am to 5pm)

## 2016-11-23 DIAGNOSIS — K219 Gastro-esophageal reflux disease without esophagitis: Secondary | ICD-10-CM

## 2016-11-23 LAB — GLUCOSE, CAPILLARY
GLUCOSE-CAPILLARY: 139 mg/dL — AB (ref 65–99)
Glucose-Capillary: 237 mg/dL — ABNORMAL HIGH (ref 65–99)
Glucose-Capillary: 100 mg/dL — ABNORMAL HIGH (ref 65–99)

## 2016-11-23 LAB — HEMOGLOBIN A1C
Hgb A1c MFr Bld: 8.9 % — ABNORMAL HIGH (ref 4.8–5.6)
Mean Plasma Glucose: 209 mg/dL

## 2016-11-23 LAB — HIV ANTIBODY (ROUTINE TESTING W REFLEX): HIV SCREEN 4TH GENERATION: NONREACTIVE

## 2016-11-23 MED ORDER — AMLODIPINE BESYLATE 5 MG PO TABS: 5.0000 mg | ORAL_TABLET | Freq: Every day | ORAL | 0 refills | Status: DC

## 2016-11-23 MED ORDER — ASPIRIN EC 325 MG PO TBEC: 325.0000 mg | DELAYED_RELEASE_TABLET | Freq: Every day | ORAL | 0 refills | Status: DC

## 2016-11-23 NOTE — Discharge Summary (Signed)
Physician Discharge Summary  Trevor Crawford QIW:[id number] DOB: 1950/08/21 DOA: 11/21/2016  PCP: Pcp Not In System  Admit date: 11/21/2016 Discharge date: 11/23/2016  Admitted From: home Disposition:  home  Recommendations for Outpatient Follow-up:  1. Follow up with PCP in 1-2 weeks 2. Please obtain BMP/CBC in one week 3. ACE inhibitor discontinued due to elevated creatinine and chronic kidney disease. Started on amlodipine for hypertension. 4. Glipizide discontinued due to hypoglycemia on admission. Should be readdressed  primary care physician.  Discharge Condition:stable CODE STATUS:full code Diet recommendation: Heart Healthy / Carb Modified   Brief/Interim Summary: 66 year old male with history of chronic kidney disease, previous strokes, presents with left-sided numbness and found to have an acute CVA. He was admitted for further treatments.  Discharge Diagnoses:  Principal Problem:   Acute CVA (cerebrovascular accident) ([place]) Active Problems:   HTN (hypertension)   BPH (benign prostatic hyperplasia)   T2DM (type 2 diabetes mellitus) ([place])   CKD stage 4 due to type 2 diabetes mellitus ([facility])   Glaucoma   Hyperlipidemia   GERD (gastroesophageal reflux disease)  1. Acute CVA. MRI confirms acute 5 mm nonhemorrhagic infarct involving the right midbrain. He also has remote lacunar infarcts. Patient reports a chronically takes baby aspirin. Plavix was initially admitted to antiplatelet regimen, the patient was seen by neurology and recommendations were to discontinue Plavix and change aspirin to full dose. Echocardiogram is unremarkable. Carotid Dopplers are also unremarkable. Lipid panel shows an LDL of 66, continue Lipitor. Hemoglobin A1c is 8.9. Patient was advised to quit smoking.  2. Hypertension. Blood pressure is currently stable. Continue current treatments. Lisinopril discontinued due to elevated creatinine. Start her on Norvasc..  3. CKD stage III. Creatinine is  currently at baseline. Continue to monitor. Lisinopril discontinued  4. Diabetes. Glipizide was held on admission due to episodes of hypoglycemia. He was continued on Lantus. Blood sugars have been stable. Continue to follow as an outpatient.  5. History of glaucoma with residual right-sided blindness. He reports that he recently had cataract surgery on the left eye and vision is improving in that eye.  Discharge Instructions  Discharge Instructions    Diet - low sodium heart healthy    Complete by:  As directed    Increase activity slowly    Complete by:  As directed      Allergies as of 11/23/2016   No Known Allergies     Medication List    STOP taking these medications   glipiZIDE 10 MG tablet Commonly known as:  GLUCOTROL   lisinopril 40 MG tablet Commonly known as:  PRINIVIL,ZESTRIL     TAKE these medications   amLODipine 5 MG tablet Commonly known as:  NORVASC Take 1 tablet (5 mg total) by mouth daily.   aspirin EC 325 MG tablet Take 1 tablet (325 mg total) by mouth daily. What changed:  medication strength  how much to take   atorvastatin 80 MG tablet Commonly known as:  LIPITOR Take 40 mg by mouth at bedtime.   brimonidine 0.2 % ophthalmic solution Commonly known as:  ALPHAGAN Place 1 drop into both eyes 2 (two) times daily.   carboxymethylcellulose 0.5 % Soln Commonly known as:  REFRESH PLUS Apply 1 drop to eye 4 (four) times daily.   ciprofloxacin 0.3 % ophthalmic solution Commonly known as:  CILOXAN Place 1 drop into the left eye 4 (four) times daily. Administer 1 drop, every 2 hours, while awake, for 2 days. Then 1 drop, every 4 hours,  while awake, for the next 5 days.   dorzolamide-timolol 22.3-6.8 MG/ML ophthalmic solution Commonly known as:  COSOPT 1 drop 2 (two) times daily.   furosemide 40 MG tablet Commonly known as:  LASIX Take 40 mg by mouth every morning.   ketorolac 0.5 % ophthalmic solution Commonly known as:  ACULAR Place 1  drop into the left eye 4 (four) times daily.   LANTUS SOLOSTAR 100 UNIT/ML Solostar Pen Generic drug:  Insulin Glargine Inject 20 Units into the skin daily.   latanoprost 0.005 % ophthalmic solution Commonly known as:  XALATAN Place 1 drop into both eyes at bedtime.   omeprazole 20 MG capsule Commonly known as:  PRILOSEC Take 20 mg by mouth daily.   prednisoLONE acetate 1 % ophthalmic suspension Commonly known as:  PRED FORTE Place 1 drop into the left eye as directed.   simethicone 80 MG chewable tablet Commonly known as:  MYLICON Chew 115 mg by mouth 2 (two) times daily as needed for flatulence.   tamsulosin 0.4 MG Caps capsule Commonly known as:  FLOMAX Take 1 capsule (0.4 mg total) by mouth daily.       No Known Allergies  Consultations:  Neurology   Procedures/Studies: Dg Chest 2 View  Result Date: 11/21/2016 CLINICAL DATA:  Acute CVA. EXAM: CHEST  2 VIEW COMPARISON:  None. FINDINGS: The cardiomediastinal contours are normal. The lungs are clear. Pulmonary vasculature is normal. No consolidation, pleural effusion, or pneumothorax. No acute osseous abnormalities are seen. Mild degenerative change in the spine. IMPRESSION: No acute abnormality. Electronically Signed   By: Jeb Levering M.D.   On: 11/21/2016 23:19   Ct Head Wo Contrast  Result Date: 11/21/2016 CLINICAL DATA:  Left arm weakness, recent left eye surgery, hyperglycemia, evaluate for intracranial hemorrhage EXAM: CT HEAD WITHOUT CONTRAST TECHNIQUE: Contiguous axial images were obtained from the base of the skull through the vertex without intravenous contrast. COMPARISON:  None. FINDINGS: Brain: No evidence of acute infarction, hemorrhage, hydrocephalus, extra-axial collection or mass lesion/mass effect. Subcortical white matter and periventricular small vessel ischemic changes. Vascular: Intracranial atherosclerosis. Skull: Normal. Negative for fracture or focal lesion. Sinuses/Orbits: Mild partial  opacification of the bilateral frontal sinuses (left greater than right) and bilateral ethmoid sinuses. Mastoid air cells are clear. Other: None. IMPRESSION: No evidence of acute intracranial abnormality. Small vessel ischemic changes. Electronically Signed   By: Julian Hy M.D.   On: 11/21/2016 16:39   Mr Jodene Nam Head Wo Contrast  Result Date: 11/21/2016 CLINICAL DATA:  Stroke. Acute onset of left upper and lower extremity weakness yesterday at 12 o'clock noon. Hyperglycemia. Acute brain stem infarct. EXAM: MRA HEAD WITHOUT CONTRAST TECHNIQUE: Angiographic images of the Circle of Willis were obtained using MRA technique without intravenous contrast. COMPARISON:  MRI brain from the same day. FINDINGS: The internal carotid arteries are within normal limits the high cervical segments through the ICA termini bilaterally. The A1 and M1 segments are normal. No definite anterior communicating artery is present. The MCA bifurcations are intact. ACA and MCA branch vessels are within normal limits. The left vertebral artery is the dominant vessel. The right vertebral artery is not visualized. This may be hypoplastic or occluded. The left PICA origin is visualized and normal. The right AICA is dominant. The posterior cerebral arteries originate from the basilar tip. Distal PCA branch vessels are not well seen. IMPRESSION: 1. Nonvisualization of the right vertebral artery. This may represent a hypoplastic or occluded vessel. 2. Dominant left vertebral artery. 3. No other  significant proximal stenosis, aneurysm, or branch vessel occlusion. Electronically Signed   By: San Morelle M.D.   On: 11/21/2016 17:46   Mr Brain Wo Contrast  Result Date: 11/21/2016 CLINICAL DATA:  Left arm and leg weakness beginning yesterday at 12 o'clock noon. Hyperglycemia. Recent left eye surgery. EXAM: MRI HEAD WITHOUT CONTRAST TECHNIQUE: Multiplanar, multiecho pulse sequences of the brain and surrounding structures were obtained  without intravenous contrast. COMPARISON:  CT head without contrast from the same day. FINDINGS: Brain: An acute nonhemorrhagic 5 mm infarct is present in the right midbrain, likely impacting the cortical spinal tracts. No other acute infarct is present. Mild atrophy and moderate age advanced white matter disease is present bilaterally. There is a remote lacunar infarct in the left corona radiata. Remote lacunar infarcts are present in the thalami bilaterally. White matter changes extend into the brainstem. A remote lacunar infarct is present in the right pons. The cerebellum is normal bilaterally. Vascular: Flow is present in the major intracranial arteries. Skull and upper cervical spine: Skullbase is a normal limits. The craniocervical junction is normal. Midline sagittal structures are unremarkable. Sinuses/Orbits: Mild mucosal thickening is present in the maxillary and sinuses and ethmoid air cells, left greater than right. There is partial opacification of the left frontal sinus. Mucosal thickening is present in the right frontal sinus. The sphenoid sinuses are clear bilaterally. Fluid is present in the mastoid air cells bilaterally. No obstructing nasopharyngeal lesion is present. IMPRESSION: 1. Acute 5 mm nonhemorrhagic infarct involving the right midbrain. Given the symptoms, this likely impacts the cortical spinal tracts. 2. Remote lacunar infarct just inferior in the right pons. 3. Remote lacunar infarcts of the thalami bilaterally. 4. Remote lacunar infarct of the left corona radiata. 5. Moderate age advanced white matter disease likely reflects the sequela of chronic microvascular ischemia. These results were called by telephone at the time of interpretation on 11/21/2016 at 5:43 pm to Dr. Roderic Palau , who verbally acknowledged these results. Electronically Signed   By: San Morelle M.D.   On: 11/21/2016 17:44   US Carotid Bilateral (at [facility] Only)  Result Date: 11/22/2016 CLINICAL DATA:   CVA. EXAM: BILATERAL CAROTID DUPLEX ULTRASOUND TECHNIQUE: Pearline Cables scale imaging, color Doppler and duplex ultrasound were performed of bilateral carotid and vertebral arteries in the neck. COMPARISON:  MRI 11/21/2016. FINDINGS: Criteria: Quantification of carotid stenosis is based on velocity parameters that correlate the residual internal carotid diameter with NASCET-based stenosis levels, using the diameter of the distal internal carotid lumen as the denominator for stenosis measurement. The following velocity measurements were obtained: RIGHT ICA:  90/32 cm/sec CCA:  14/97 cm/sec SYSTOLIC ICA/CCA RATIO:  1.2 DIASTOLIC ICA/CCA RATIO:  2.9 ECA:  141 cm/sec LEFT ICA:  93/25 cm/sec CCA:   02/63 cm/sec SYSTOLIC ICA/CCA RATIO:  1.0 DIASTOLIC ICA/CCA RATIO:  1.2 ECA:  95 cm/sec RIGHT CAROTID ARTERY: Mild right common carotid, carotid bifurcation, ICA atherosclerotic vascular disease. No flow limiting stenosis. Degree of stenosis less than 50% . RIGHT VERTEBRAL ARTERY:  Patent with antegrade flow. LEFT CAROTID ARTERY: Mild left common carotid, carotid bifurcation, ICA atherosclerotic vascular disease. No flow limiting stenosis. Degree of stenosis less than 50%. LEFT VERTEBRAL ARTERY:  Patent with antegrade flow . IMPRESSION: 1. Mild bilateral common carotid, carotid bifurcation, proximal ICA atherosclerotic vascular disease. No flow limiting stenosis. Degree of stenosis less than 50%. 2. Vertebrals are patent with antegrade flow. Electronically Signed   By: Marcello Moores  Register   On: 11/22/2016 10:19  Echo:- Left ventricle: The cavity size was normal. Wall thickness was increased in a pattern of mild LVH. Systolic function was normal. The estimated ejection fraction was in the range of 60% to 65%. Wall motion was normal; there were no regional wall motion abnormalities. Doppler parameters are consistent with abnormal left ventricular relaxation (grade 1 diastolic dysfunction). - Aortic valve: Valve area  (VTI): 3.06 cm^2. Valve area (Vmax): 2.64 cm^2. Valve area (Vmean): 2.83 cm^2. - Atrial septum: No defect or patent foramen ovale was identified.   Subjective: Feels the numbness on the left side is improving.  Discharge Exam: Vitals:   11/23/16 0900 11/23/16 1300  BP: (!) 151/74 (!) 154/85  Pulse: 64 62  Resp: 16 16  Temp: 97.8 F (36.6 C) 97.5 F (36.4 C)   Vitals:   11/23/16 0100 11/23/16 0500 11/23/16 0900 11/23/16 1300  BP: 120/65 (!) 142/81 (!) 151/74 (!) 154/85  Pulse: 61 65 64 62  Resp: 16 20 16 16   Temp: 97.7 F (36.5 C) 97.7 F (36.5 C) 97.8 F (36.6 C) 97.5 F (36.4 C)  TempSrc: Oral Oral Oral Oral  SpO2: 99% 100% 100% 100%  Weight:      Height:        General: Pt is alert, awake, not in acute distress Cardiovascular: RRR, S1/S2 +, no rubs, no gallops Respiratory: CTA bilaterally, no wheezing, no rhonchi Abdominal: Soft, NT, ND, bowel sounds + Extremities: no edema, no cyanosis    The results of significant diagnostics from this hospitalization (including imaging, microbiology, ancillary and laboratory) are listed below for reference.     Microbiology: No results found for this or any previous visit (from the past 240 hour(s)).   Labs: BNP (last 3 results) No results for input(s): BNP in the last 8760 hours. Basic Metabolic Panel:  Recent Labs Lab 11/21/16 1441 11/22/16 0444  NA 132* 140  K 4.3 3.9  CL 99* 108  CO2 25 23  GLUCOSE 391* 118*  BUN 46* 35*  CREATININE 2.22* 1.66*  CALCIUM 9.4 8.9   Liver Function Tests:  Recent Labs Lab 11/21/16 1441 11/22/16 0444  AST 16 13*  ALT 21 18  ALKPHOS 113 91  BILITOT 0.4 0.2*  PROT 7.0 6.2*  ALBUMIN 3.6 3.1*   No results for input(s): LIPASE, AMYLASE in the last 168 hours. No results for input(s): AMMONIA in the last 168 hours. CBC:  Recent Labs Lab 11/21/16 1441 11/22/16 0444  WBC 7.0 7.1  NEUTROABS 4.6  --   HGB 11.5* 11.1*  HCT 35.1* 33.1*  MCV 88.0 87.8  PLT 196 212    Cardiac Enzymes:  Recent Labs Lab 11/21/16 1539  TROPONINI <0.03   BNP: Invalid input(s): POCBNP CBG:  Recent Labs Lab 11/22/16 1104 11/22/16 1601 11/22/16 2047 11/23/16 0748 11/23/16 1130  GLUCAP 123* 237* 122* 100* 139*   D-Dimer No results for input(s): DDIMER in the last 72 hours. Hgb A1c  Recent Labs  11/21/16 1434  HGBA1C 8.9*   Lipid Profile  Recent Labs  11/22/16 0444  CHOL 121  HDL 31*  LDLCALC 66  TRIG 120  CHOLHDL 3.9   Thyroid function studies No results for input(s): TSH, T4TOTAL, T3FREE, THYROIDAB in the last 72 hours.  Invalid input(s): FREET3 Anemia work up No results for input(s): VITAMINB12, FOLATE, FERRITIN, TIBC, IRON, RETICCTPCT in the last 72 hours. Urinalysis    Component Value Date/Time   COLORURINE YELLOW 11/21/2016 1424   APPEARANCEUR CLEAR 11/21/2016 1424   LABSPEC 1.010 11/21/2016  1424   PHURINE 5.0 11/21/2016 1424   GLUCOSEU >=500 (A) 11/21/2016 1424   HGBUR NEGATIVE 11/21/2016 1424   BILIRUBINUR NEGATIVE 11/21/2016 1424   KETONESUR NEGATIVE 11/21/2016 1424   PROTEINUR 30 (A) 11/21/2016 1424   UROBILINOGEN 0.2 05/12/2013 2357   NITRITE NEGATIVE 11/21/2016 1424   LEUKOCYTESUR NEGATIVE 11/21/2016 1424   Sepsis Labs Invalid input(s): PROCALCITONIN,  WBC,  LACTICIDVEN Microbiology No results found for this or any previous visit (from the past 240 hour(s)).   Time coordinating discharge: Over 30 minutes  SIGNED:   Kathie Dike, MD  [facility] 11/23/2016, 7:07 PM Pager   If 7PM-7AM, please contact night-coverage [email or web address] Password [email or web address]

## 2016-11-23 NOTE — Progress Notes (Signed)
Discharge instructions gone over with patient. Verbalized understanding.

## 2017-04-04 ENCOUNTER — Encounter (HOSPITAL_COMMUNITY): Payer: Self-pay | Admitting: Emergency Medicine

## 2017-04-04 ENCOUNTER — Emergency Department (HOSPITAL_COMMUNITY): Payer: Medicare HMO

## 2017-04-04 ENCOUNTER — Inpatient Hospital Stay (HOSPITAL_COMMUNITY)
Admission: EM | Admit: 2017-04-04 | Discharge: 2017-04-05 | DRG: 066 | Disposition: A | Discharge status: Home Health | Payer: Medicare HMO | Attending: Family Medicine | Admitting: Family Medicine

## 2017-04-04 DIAGNOSIS — F17210 Nicotine dependence, cigarettes, uncomplicated: Secondary | ICD-10-CM | POA: Diagnosis present

## 2017-04-04 DIAGNOSIS — Z9842 Cataract extraction status, left eye: Secondary | ICD-10-CM

## 2017-04-04 DIAGNOSIS — Z8673 Personal history of transient ischemic attack (TIA), and cerebral infarction without residual deficits: Secondary | ICD-10-CM

## 2017-04-04 DIAGNOSIS — R29701 NIHSS score 1: Secondary | ICD-10-CM | POA: Diagnosis present

## 2017-04-04 DIAGNOSIS — I129 Hypertensive chronic kidney disease with stage 1 through stage 4 chronic kidney disease, or unspecified chronic kidney disease: Secondary | ICD-10-CM | POA: Diagnosis present

## 2017-04-04 DIAGNOSIS — R262 Difficulty in walking, not elsewhere classified: Secondary | ICD-10-CM | POA: Diagnosis present

## 2017-04-04 DIAGNOSIS — Z79899 Other long term (current) drug therapy: Secondary | ICD-10-CM | POA: Diagnosis not present

## 2017-04-04 DIAGNOSIS — E785 Hyperlipidemia, unspecified: Secondary | ICD-10-CM | POA: Diagnosis not present

## 2017-04-04 DIAGNOSIS — I10 Essential (primary) hypertension: Secondary | ICD-10-CM | POA: Diagnosis not present

## 2017-04-04 DIAGNOSIS — Z794 Long term (current) use of insulin: Secondary | ICD-10-CM | POA: Diagnosis not present

## 2017-04-04 DIAGNOSIS — G5621 Lesion of ulnar nerve, right upper limb: Secondary | ICD-10-CM | POA: Diagnosis present

## 2017-04-04 DIAGNOSIS — H409 Unspecified glaucoma: Secondary | ICD-10-CM | POA: Diagnosis present

## 2017-04-04 DIAGNOSIS — H548 Legal blindness, as defined in USA: Secondary | ICD-10-CM | POA: Diagnosis present

## 2017-04-04 DIAGNOSIS — E1122 Type 2 diabetes mellitus with diabetic chronic kidney disease: Secondary | ICD-10-CM | POA: Diagnosis present

## 2017-04-04 DIAGNOSIS — E119 Type 2 diabetes mellitus without complications: Secondary | ICD-10-CM

## 2017-04-04 DIAGNOSIS — K219 Gastro-esophageal reflux disease without esophagitis: Secondary | ICD-10-CM | POA: Diagnosis present

## 2017-04-04 DIAGNOSIS — R270 Ataxia, unspecified: Secondary | ICD-10-CM | POA: Diagnosis present

## 2017-04-04 DIAGNOSIS — R42 Dizziness and giddiness: Secondary | ICD-10-CM | POA: Diagnosis present

## 2017-04-04 DIAGNOSIS — F329 Major depressive disorder, single episode, unspecified: Secondary | ICD-10-CM | POA: Diagnosis present

## 2017-04-04 DIAGNOSIS — E1142 Type 2 diabetes mellitus with diabetic polyneuropathy: Secondary | ICD-10-CM | POA: Diagnosis not present

## 2017-04-04 DIAGNOSIS — I639 Cerebral infarction, unspecified: Principal | ICD-10-CM | POA: Diagnosis present

## 2017-04-04 DIAGNOSIS — H547 Unspecified visual loss: Secondary | ICD-10-CM

## 2017-04-04 DIAGNOSIS — N183 Chronic kidney disease, stage 3 unspecified: Secondary | ICD-10-CM

## 2017-04-04 LAB — RAPID URINE DRUG SCREEN, HOSP PERFORMED
Amphetamines: NOT DETECTED
Barbiturates: NOT DETECTED
Benzodiazepines: NOT DETECTED
COCAINE: NOT DETECTED
OPIATES: NOT DETECTED
Tetrahydrocannabinol: NOT DETECTED

## 2017-04-04 LAB — CBC
HCT: 37.4 % — ABNORMAL LOW (ref 39.0–52.0)
Hemoglobin: 12.5 g/dL — ABNORMAL LOW (ref 13.0–17.0)
MCH: 29.3 pg (ref 26.0–34.0)
MCHC: 33.4 g/dL (ref 30.0–36.0)
MCV: 87.6 fL (ref 78.0–100.0)
Platelets: 192 10*3/uL (ref 150–400)
RBC: 4.27 MIL/uL (ref 4.22–5.81)
RDW: 13.7 % (ref 11.5–15.5)
WBC: 6.6 10*3/uL (ref 4.0–10.5)

## 2017-04-04 LAB — I-STAT CHEM 8, ED
BUN: 37 mg/dL — AB (ref 6–20)
CALCIUM ION: 1.22 mmol/L (ref 1.15–1.40)
CREATININE: 2.3 mg/dL — AB (ref 0.61–1.24)
Chloride: 108 mmol/L (ref 101–111)
Glucose, Bld: 186 mg/dL — ABNORMAL HIGH (ref 65–99)
HCT: 37 % — ABNORMAL LOW (ref 39.0–52.0)
HEMOGLOBIN: 12.6 g/dL — AB (ref 13.0–17.0)
Potassium: 4.5 mmol/L (ref 3.5–5.1)
SODIUM: 141 mmol/L (ref 135–145)
TCO2: 24 mmol/L (ref 22–32)

## 2017-04-04 LAB — I-STAT TROPONIN, ED: TROPONIN I, POC: 0 ng/mL (ref 0.00–0.08)

## 2017-04-04 LAB — COMPREHENSIVE METABOLIC PANEL
ALT: 16 U/L — ABNORMAL LOW (ref 17–63)
ANION GAP: 8 (ref 5–15)
AST: 15 U/L (ref 15–41)
Albumin: 3.5 g/dL (ref 3.5–5.0)
Alkaline Phosphatase: 88 U/L (ref 38–126)
BILIRUBIN TOTAL: 0.3 mg/dL (ref 0.3–1.2)
BUN: 42 mg/dL — ABNORMAL HIGH (ref 6–20)
CALCIUM: 9.2 mg/dL (ref 8.9–10.3)
CO2: 24 mmol/L (ref 22–32)
Chloride: 107 mmol/L (ref 101–111)
Creatinine, Ser: 2.32 mg/dL — ABNORMAL HIGH (ref 0.61–1.24)
GFR, EST AFRICAN AMERICAN: 32 mL/min — AB (ref >60)
GFR, EST NON AFRICAN AMERICAN: 28 mL/min — AB (ref >60)
GLUCOSE: 191 mg/dL — AB (ref 65–99)
POTASSIUM: 4.5 mmol/L (ref 3.5–5.1)
Sodium: 139 mmol/L (ref 135–145)
TOTAL PROTEIN: 6.6 g/dL (ref 6.5–8.1)

## 2017-04-04 LAB — DIFFERENTIAL
Basophils Absolute: 0 10*3/uL (ref 0.0–0.1)
Basophils Relative: 0 %
EOS PCT: 7 %
Eosinophils Absolute: 0.4 10*3/uL (ref 0.0–0.7)
LYMPHS ABS: 1.2 10*3/uL (ref 0.7–4.0)
LYMPHS PCT: 17 %
MONO ABS: 0.7 10*3/uL (ref 0.1–1.0)
Monocytes Relative: 10 %
Neutro Abs: 4.4 10*3/uL (ref 1.7–7.7)
Neutrophils Relative %: 66 %

## 2017-04-04 LAB — PROTIME-INR
INR: 0.96
PROTHROMBIN TIME: 12.7 s (ref 11.4–15.2)

## 2017-04-04 LAB — URINALYSIS, ROUTINE W REFLEX MICROSCOPIC
Bilirubin Urine: NEGATIVE
GLUCOSE, UA: NEGATIVE mg/dL
Hgb urine dipstick: NEGATIVE
Ketones, ur: NEGATIVE mg/dL
LEUKOCYTES UA: NEGATIVE
NITRITE: NEGATIVE
PH: 5.5 (ref 5.0–8.0)
Protein, ur: 100 mg/dL — AB
Specific Gravity, Urine: 1.025 (ref 1.005–1.030)

## 2017-04-04 LAB — URINALYSIS, MICROSCOPIC (REFLEX): SQUAMOUS EPITHELIAL / LPF: NONE SEEN

## 2017-04-04 LAB — GLUCOSE, CAPILLARY
GLUCOSE-CAPILLARY: 131 mg/dL — AB (ref 65–99)
GLUCOSE-CAPILLARY: 162 mg/dL — AB (ref 65–99)

## 2017-04-04 LAB — APTT: aPTT: 33 seconds (ref 24–36)

## 2017-04-04 LAB — ETHANOL: Alcohol, Ethyl (B): <5 mg/dL (ref <5)

## 2017-04-04 MED ORDER — ACETAMINOPHEN 325 MG PO TABS: 650.0000 mg | ORAL_TABLET | Freq: Four times a day (QID) | ORAL | Status: DC | PRN

## 2017-04-04 MED ORDER — TAMSULOSIN HCL 0.4 MG PO CAPS
0.4000 mg | ORAL_CAPSULE | Freq: Every day | ORAL | Status: DC
  Administered 2017-04-05: 0.4 mg via ORAL
  Filled 2017-04-04: qty 1

## 2017-04-04 MED ORDER — STROKE: EARLY STAGES OF RECOVERY BOOK
Freq: Once | Status: DC
  Filled 2017-04-04: qty 1

## 2017-04-04 MED ORDER — DORZOLAMIDE HCL-TIMOLOL MAL 2-0.5 % OP SOLN
1.0000 [drp] | Freq: Two times a day (BID) | OPHTHALMIC | Status: DC
  Administered 2017-04-04 – 2017-04-05 (×2): 1 [drp] via OPHTHALMIC
  Filled 2017-04-04: qty 10

## 2017-04-04 MED ORDER — MECLIZINE HCL 12.5 MG PO TABS
25.0000 mg | ORAL_TABLET | Freq: Once | ORAL | Status: AC
  Administered 2017-04-04: 25 mg via ORAL
  Filled 2017-04-04: qty 2

## 2017-04-04 MED ORDER — SODIUM CHLORIDE 0.9 % IV SOLN: 250.0000 mL | INTRAVENOUS | Status: DC | PRN

## 2017-04-04 MED ORDER — INSULIN GLARGINE 100 UNIT/ML ~~LOC~~ SOLN
24.0000 [IU] | Freq: Every morning | SUBCUTANEOUS | Status: DC
  Administered 2017-04-05: 24 [IU] via SUBCUTANEOUS
  Filled 2017-04-04 (×4): qty 0.24

## 2017-04-04 MED ORDER — SODIUM CHLORIDE 0.9% FLUSH: 3.0000 mL | INTRAVENOUS | Status: DC | PRN

## 2017-04-04 MED ORDER — ASPIRIN 81 MG PO CHEW
324.0000 mg | CHEWABLE_TABLET | Freq: Every day | ORAL | Status: DC
  Administered 2017-04-04 – 2017-04-05 (×2): 324 mg via ORAL
  Filled 2017-04-04 (×2): qty 4

## 2017-04-04 MED ORDER — SODIUM CHLORIDE 0.9% FLUSH
3.0000 mL | Freq: Two times a day (BID) | INTRAVENOUS | Status: DC
  Administered 2017-04-04 – 2017-04-05 (×2): 3 mL via INTRAVENOUS

## 2017-04-04 MED ORDER — POLYVINYL ALCOHOL 1.4 % OP SOLN
1.0000 [drp] | Freq: Four times a day (QID) | OPHTHALMIC | Status: DC
  Administered 2017-04-04 – 2017-04-05 (×3): 1 [drp] via OPHTHALMIC
  Filled 2017-04-04: qty 15

## 2017-04-04 MED ORDER — BRIMONIDINE TARTRATE 0.2 % OP SOLN
1.0000 [drp] | Freq: Two times a day (BID) | OPHTHALMIC | Status: DC
  Administered 2017-04-04 – 2017-04-05 (×2): 1 [drp] via OPHTHALMIC
  Filled 2017-04-04: qty 5

## 2017-04-04 MED ORDER — SIMETHICONE 80 MG PO CHEW: 160.0000 mg | CHEWABLE_TABLET | Freq: Two times a day (BID) | ORAL | Status: DC | PRN

## 2017-04-04 MED ORDER — ATORVASTATIN CALCIUM 40 MG PO TABS
40.0000 mg | ORAL_TABLET | Freq: Every day | ORAL | Status: DC
  Administered 2017-04-04: 40 mg via ORAL
  Filled 2017-04-04: qty 1

## 2017-04-04 MED ORDER — CARBOXYMETHYLCELLULOSE SODIUM 0.5 % OP SOLN: 1.0000 [drp] | Freq: Four times a day (QID) | OPHTHALMIC | Status: DC

## 2017-04-04 MED ORDER — SODIUM CHLORIDE 0.9% FLUSH
3.0000 mL | Freq: Two times a day (BID) | INTRAVENOUS | Status: DC
  Administered 2017-04-05: 3 mL via INTRAVENOUS

## 2017-04-04 MED ORDER — PANTOPRAZOLE SODIUM 40 MG PO TBEC
40.0000 mg | DELAYED_RELEASE_TABLET | Freq: Every day | ORAL | Status: DC
  Administered 2017-04-05: 40 mg via ORAL
  Filled 2017-04-04: qty 1

## 2017-04-04 MED ORDER — FUROSEMIDE 40 MG PO TABS
40.0000 mg | ORAL_TABLET | Freq: Every morning | ORAL | Status: DC
  Administered 2017-04-05: 40 mg via ORAL
  Filled 2017-04-04: qty 1

## 2017-04-04 MED ORDER — ACETAMINOPHEN 650 MG RE SUPP: 650.0000 mg | Freq: Four times a day (QID) | RECTAL | Status: DC | PRN

## 2017-04-04 MED ORDER — INSULIN ASPART 100 UNIT/ML ~~LOC~~ SOLN
0.0000 [IU] | Freq: Three times a day (TID) | SUBCUTANEOUS | Status: DC
  Administered 2017-04-05: 3 [IU] via SUBCUTANEOUS

## 2017-04-04 NOTE — ED Triage Notes (Signed)
Onset 2 days ago, dizziness, feel like the room is spinning and nausea

## 2017-04-04 NOTE — ED Provider Notes (Signed)
[name] DEPT Provider Note   CSN: [id number] Arrival date & time: 04/04/17  1012     History   Chief Complaint Chief Complaint  Patient presents with  . Dizziness    HPI Trevor Crawford is a 66 y.o. male.  HPI 66 year old male who presents with dizziness. He has a history of hypertension, hyperlipidemia, diabetes, chronic kidney disease, and previous CVA without deficits. He is legally blind in the right eye. Reports 2 days of progressive dizziness, which she describes as the room is spinning. He states that he feels like he is drunk, and has not been able to ambulate today. Denies any nausea, vomiting, headache, double vision, dysphagia, dysarthria, aphasia, focal numbness or weakness. No recent illnesses including fevers or chills, or upper respiratory symptoms. Symptoms are aggravated when he turns his head. States that if he lies flat and closes his eyes, it does mildly improved. Past Medical History:  Diagnosis Date  . Arthritis   . Cataract    bilateral  . Chronic kidney disease   . Depression   . Diabetes mellitus without complication ([place])   . GERD (gastroesophageal reflux disease)   . Glaucoma   . Hyperlipidemia   . Hypertension   . Legally blind in right eye, as defined in [place]   . Neuromuscular disorder ([place])   . Neuropathy     Patient Active Problem List   Diagnosis Date Noted  . CVA (cerebral vascular accident) ([place]) 04/04/2017  . Acute CVA (cerebrovascular accident) ([place]) 11/21/2016  . Glaucoma 11/21/2016  . Hyperlipidemia 11/21/2016  . GERD (gastroesophageal reflux disease) 11/21/2016  . CKD stage 4 due to type 2 diabetes mellitus ([place]) 05/10/2016  . Depression 05/07/2016  . HTN (hypertension) 05/07/2016  . Blindness 05/07/2016  . BPH (benign prostatic hyperplasia) 05/07/2016  . T2DM (type 2 diabetes mellitus) ([place]) 05/07/2016  . Renal calculi 10/06/2015  . Ureteral calculus, left 10/05/2015    Past Surgical History:  Procedure Laterality Date   . CATARACT EXTRACTION Left   . CYSTOSCOPY WITH RETROGRADE PYELOGRAM, URETEROSCOPY AND STENT PLACEMENT Left 10/06/2015   Procedure: CYSTOSCOPY WITH RETROGRADE PYELOGRAM, AND LEFT STENT PLACEMENT ;  Surgeon: Cleon Gustin, MD;  Location: [facility];  Service: Urology;  Laterality: Left;       Home Medications    Prior to Admission medications   Medication Sig Start Date End Date Taking? Authorizing Provider  amLODipine (NORVASC) 5 MG tablet Take 1 tablet (5 mg total) by mouth daily. 11/23/16   Kathie Dike, MD  aspirin EC 325 MG tablet Take 1 tablet (325 mg total) by mouth daily. 11/23/16   Kathie Dike, MD  atorvastatin (LIPITOR) 80 MG tablet Take 40 mg by mouth at bedtime.    [provider]  brimonidine (ALPHAGAN) 0.2 % ophthalmic solution Place 1 drop into both eyes 2 (two) times daily.    [provider]  carboxymethylcellulose (REFRESH PLUS) 0.5 % SOLN Apply 1 drop to eye 4 (four) times daily.    [provider]  ciprofloxacin (CILOXAN) 0.3 % ophthalmic solution Place 1 drop into the left eye 4 (four) times daily. Administer 1 drop, every 2 hours, while awake, for 2 days. Then 1 drop, every 4 hours, while awake, for the next 5 days.    [provider]  dorzolamide-timolol (COSOPT) 22.3-6.8 MG/ML ophthalmic solution 1 drop 2 (two) times daily.    [provider]  furosemide (LASIX) 40 MG tablet Take 40 mg by mouth every morning.    [provider]  Insulin Glargine (LANTUS SOLOSTAR) 100 UNIT/ML Solostar Pen Inject 20 Units into the skin daily.     [provider]  ketorolac (ACULAR) 0.5 % ophthalmic solution Place 1 drop into the left eye 4 (four) times daily.    [provider]  latanoprost (XALATAN) 0.005 % ophthalmic solution Place 1 drop into both eyes at bedtime.    [provider]  omeprazole (PRILOSEC) 20 MG capsule Take 20 mg by mouth daily.    [provider]  prednisoLONE acetate (PRED  FORTE) 1 % ophthalmic suspension Place 1 drop into the left eye as directed.    [provider]  simethicone (MYLICON) 80 MG chewable tablet Chew 160 mg by mouth 2 (two) times daily as needed for flatulence.    [provider]  tamsulosin (FLOMAX) 0.4 MG CAPS capsule Take 1 capsule (0.4 mg total) by mouth daily. 11/07/15   Ezequiel Essex, MD    Family History Family History  Problem Relation Age of Onset  . Cancer Mother   . Alcoholism Father   . Cancer Father   . Cancer Other   . Cancer Sister   . Diabetes Brother   . Stroke Brother     Social History Social History  Substance Use Topics  . Smoking status: Current Every Day Smoker    Packs/day: 1.00    Years: 55.00    Types: Cigarettes  . Smokeless tobacco: Never Used  . Alcohol use No     Allergies   Patient has no known allergies.   Review of Systems Review of Systems  Constitutional: Negative for fever.  Respiratory: Negative for shortness of breath.   Cardiovascular: Negative for chest pain.  Musculoskeletal: Positive for gait problem.  Neurological: Positive for dizziness.  All other systems reviewed and are negative.    Physical Exam Updated Vital Signs BP (!) 159/82   Pulse 62   Temp 97.6 F (36.4 C) (Oral)   Resp 13   Ht 6' (1.829 m)   Wt 92.5 kg (204 lb)   SpO2 99%   BMI 27.67 kg/m   Physical Exam Physical Exam  Nursing note and vitals reviewed. Constitutional: Well developed, well nourished, non-toxic, and in no acute distress Head: Normocephalic and atraumatic.  Mouth/Throat: Oropharynx is clear and moist.  Neck: Normal range of motion. Neck supple.  Cardiovascular: Normal rate and regular rhythm.   Pulmonary/Chest: Effort normal and breath sounds normal.  Abdominal: Soft. There is no tenderness. There is no rebound and no guarding.  Musculoskeletal: Normal range of motion.  Skin: Skin is warm and dry.  Psychiatric: Cooperative Neurological:  Alert, oriented to  person, place, time, and situation. Memory grossly in tact. Fluent speech. No dysarthria or aphasia.  Cranial nerves: VF unable to assess fully due to legal blindness.  Right eye abnormality (legal blindness), left pupil 2 mm reactive to light. EOMI but with component of rotary nystagmus at rest. No gaze deviation. Facial muscles symmetric with activation. Sensation to light touch over face in tact bilaterally. Hearing grossly in tact. Palate elevates symmetrically. Head turn and shoulder shrug are intact. Tongue midline.  Reflexes defered.  Muscle bulk and tone normal. No pronator drift. Moves all extremities symmetrically. Sensation to light touch is in tact throughout in bilateral upper and lower extremities. Coordination reveals past pointing with finger to nose of the LUE     ED Treatments / Results  Labs (all labs ordered are listed, but only abnormal results are displayed) Labs  Reviewed  CBC - Abnormal; Notable for the following:       Result Value   Hemoglobin 12.5 (*)    HCT 37.4 (*)    All other components within normal limits  COMPREHENSIVE METABOLIC PANEL - Abnormal; Notable for the following:    Glucose, Bld 191 (*)    BUN 42 (*)    Creatinine, Ser 2.32 (*)    ALT 16 (*)    GFR calc non Af Amer 28 (*)    GFR calc Af Amer 32 (*)    All other components within normal limits  URINALYSIS, ROUTINE W REFLEX MICROSCOPIC - Abnormal; Notable for the following:    Protein, ur 100 (*)    All other components within normal limits  URINALYSIS, MICROSCOPIC (REFLEX) - Abnormal; Notable for the following:    Bacteria, UA FEW (*)    All other components within normal limits  I-STAT CHEM 8, ED - Abnormal; Notable for the following:    BUN 37 (*)    Creatinine, Ser 2.30 (*)    Glucose, Bld 186 (*)    Hemoglobin 12.6 (*)    HCT 37.0 (*)    All other components within normal limits  ETHANOL  PROTIME-INR  APTT  DIFFERENTIAL  RAPID URINE DRUG SCREEN, HOSP PERFORMED  I-STAT  TROPONIN, ED    EKG  EKG Interpretation  Date/Time:  Thursday April 04 2017 10:52:21 EDT Ventricular Rate:  66 PR Interval:    QRS Duration: 110 QT Interval:  421 QTC Calculation: 442 R Axis:   -103 Text Interpretation:  Sinus rhythm S1,S2,S3 pattern Borderline low voltage, extremity leads Abnormal R-wave progression, early transition similar to previous EKG  Confirmed by Brantley Stage [phone]) on 04/04/2017 12:58:13 PM       Radiology Ct Head Wo Contrast  Result Date: 04/04/2017 CLINICAL DATA:  Focal neurological deficit for greater than 6 hours. Stroke suspected. EXAM: CT HEAD WITHOUT CONTRAST TECHNIQUE: Contiguous axial images were obtained from the base of the skull through the vertex without intravenous contrast. COMPARISON:  11/21/2016 FINDINGS: Brain: Again noted is low-density throughout the white matter suggesting chronic changes. Patchy low-density areas in the thalami appear to be chronic. No evidence for acute hemorrhage, mass lesion, midline shift, hydrocephalus or new large infarct. Vascular: No hyperdense vessel or unexpected calcification. Skull: No acute bone abnormality. Sinuses/Orbits: There is mucosal disease in the frontal sinuses and ethmoid air cells. Other: None. IMPRESSION: No acute intracranial abnormality. Evidence for chronic small vessel ischemic disease. Electronically Signed   By: Markus Daft M.D.   On: 04/04/2017 12:10   Mr Brain Wo Contrast  Result Date: 04/04/2017 CLINICAL DATA:  Subacute neuro deficit. Stroke suspected. Room spinning. EXAM: MRI HEAD WITHOUT CONTRAST TECHNIQUE: Multiplanar, multiecho pulse sequences of the brain and surrounding structures were obtained without intravenous contrast. COMPARISON:  Head CT from earlier today.  Brain MRI 11/21/2016 FINDINGS: Brain: 4 mm focus of restricted diffusion in the upper pons along the floor of the fourth ventricle. There is more mildly restricted diffusion medial to the atrium of the right lateral ventricle.  There have been multiple remote lacunar infarcts in the brainstem and bilateral thalamus. Extensive chronic microvascular ischemic gliosis in the cerebral white matter. Although there is periventricular and infratentorial signal abnormality, the presence of lacunes and multiple vascular risk factors is more consistent with chronic small vessel disease. No acute hemorrhage, hydrocephalus, or masslike findings. Remote hemorrhagic focus along the left temporal lobe medially and inferiorly. No generalized chronic blood products.  Vascular: Major flow voids are preserved. Skull and upper cervical spine: Negative for marrow lesion. Sinuses/Orbits: Cataract resection on the left. Asymmetric FLAIR hyperintensity in the right upper globe, patient is reportedly blind in the right eye. Chronic sinusitis including mucous retention cyst filling much of the left frontal sinus. IMPRESSION: 1. 4 mm acute infarct in the upper pons along the floor of the fourth ventricle. 2. More subacute appearing small infarct in the right periatrial white matter. 3. Advanced chronic small vessel ischemia with multiple remote lacunar infarcts. Electronically Signed   By: Monte Fantasia M.D.   On: 04/04/2017 13:07    Procedures Procedures (including critical care time)  Medications Ordered in ED Medications  meclizine (ANTIVERT) tablet 25 mg (25 mg Oral Given 04/04/17 1307)     Initial Impression / Assessment and Plan / ED Course  I have reviewed the triage vital signs and the nursing notes.  Pertinent labs & imaging results that were available during my care of the patient were reviewed by me and considered in my medical decision making (see chart for details).     66 year old male who presents with 2 days of dizziness and difficulty walking. His exam is concerning for cerebellar process as he has dysmetria with finger to nose in the left upper extremity as well as rotary nystagmus. CT head shows no acute intracranial processes  but chronic ischemic changes.  Subsequent MRIs performed, and shows acute stroke involving the pons as well as a subacute stroke involving the right periatrial area of the lateral ventricle. Neurology consulted. He is not within TPA window.  Spoke with Dr. Merlene Laughter who will consult upon admission. Admission to hospitalist service. Discussed with Dr. Sarajane Jews.    Final Clinical Impressions(s) / ED Diagnoses   Final diagnoses:  Acute CVA (cerebrovascular accident) [facility])    New Prescriptions New Prescriptions   No medications on file     Forde Dandy, MD 04/04/17 1415

## 2017-04-04 NOTE — ED Notes (Signed)
Report given to [facility] [place] [facility], pt going to room 327

## 2017-04-04 NOTE — H&P (Signed)
History and Physical  Trevor Crawford WSF:[id number] DOB: January 05, 1951 DOA: 04/04/2017  PCP: [facility] [place]  Patient coming from: home  Chief Complaint: Dizzy  HPI:  66 year old man PMH stroke, essential hypertension, hyperlipidemia, diabetes mellitus type 2, stroke April 2018, presented with greater than 24 hours of dizziness and ataxia. MRI brain revealed acute stroke.  Patient reports he woke up yesterday morning 8/29 with dizziness. He describes it as if objects in the room were moving although he knows they were not. He had difficulty walking and was staggering to the side because of imbalance secondary to vertigo. No specific aggravating or alleviating factors. Reported as severe as it greatly impaired his walking however he did not fall. Went to bed last night with ongoing symptoms. This a.m. symptoms persisted and he came to the emergency department for further evaluation.  ED Course: Afebrile, vital signs stable.   Review of Systems:  Negative for fever, new visual changes, sore throat, rash, new muscle aches, chest pain, SOB, dysuria, bleeding, n/v/abdominal pain.  Past Medical History:  Diagnosis Date  . Arthritis   . Cataract    bilateral  . Chronic kidney disease   . Depression   . Diabetes mellitus without complication ([place])   . GERD (gastroesophageal reflux disease)   . Glaucoma   . Hyperlipidemia   . Hypertension   . Legally blind in right eye, as defined in [place]   . Neuromuscular disorder ([place])   . Neuropathy     Past Surgical History:  Procedure Laterality Date  . CATARACT EXTRACTION Left   . CYSTOSCOPY WITH RETROGRADE PYELOGRAM, URETEROSCOPY AND STENT PLACEMENT Left 10/06/2015   Procedure: CYSTOSCOPY WITH RETROGRADE PYELOGRAM, AND LEFT STENT PLACEMENT ;  Surgeon: Cleon Gustin, MD;  Location: [facility];  Service: Urology;  Laterality: Left;     reports that he has been smoking Cigarettes.  He has a 55.00 pack-year smoking history. He has never used  smokeless tobacco. He reports that he does not drink alcohol or use drugs. Mobility: Ambulates with a cane  No Known Allergies  Family History  Problem Relation Age of Onset  . Cancer Mother   . Alcoholism Father   . Cancer Father   . Cancer Other   . Cancer Sister   . Diabetes Brother   . Stroke Brother      Prior to Admission medications   Medication Sig Start Date End Date Taking? Authorizing Provider  amLODipine (NORVASC) 5 MG tablet Take 1 tablet (5 mg total) by mouth daily. 11/23/16  Yes Kathie Dike, MD  atorvastatin (LIPITOR) 80 MG tablet Take 40 mg by mouth at bedtime.   Yes [provider]  brimonidine (ALPHAGAN) 0.2 % ophthalmic solution Place 1 drop into both eyes 2 (two) times daily.   Yes [provider]  carboxymethylcellulose (REFRESH PLUS) 0.5 % SOLN Apply 1 drop to eye 4 (four) times daily.   Yes [provider]  dorzolamide-timolol (COSOPT) 22.3-6.8 MG/ML ophthalmic solution 1 drop 2 (two) times daily.   Yes [provider]  furosemide (LASIX) 40 MG tablet Take 40 mg by mouth every morning.   Yes [provider]  Insulin Glargine (LANTUS SOLOSTAR) 100 UNIT/ML Solostar Pen Inject 24 Units into the skin daily.    Yes [provider]  omeprazole (PRILOSEC) 20 MG capsule Take 20 mg by mouth daily.   Yes [provider]  simethicone (MYLICON) 80 MG chewable tablet Chew 160 mg by mouth 2 (two) times daily as  needed for flatulence.   Yes [provider]  tamsulosin (FLOMAX) 0.4 MG CAPS capsule Take 1 capsule (0.4 mg total) by mouth daily. 11/07/15  Yes Rancour, Annie Main, MD    Physical Exam:   Afebrile, 97.5, 18, 63, 150/93, 100% on room air  Wt Readings from Last 3 Encounters:  04/04/17 92.5 kg (204 lb)  11/21/16 93.7 kg (206 lb 9.1 oz)  05/07/16 94.5 kg (208 lb 6.4 oz)    I have personally reviewed following labs and imaging studies  Labs:   Basic metabolic panel unremarkable. Creatinine  at baseline.  Hepatic function panel unremarkable.  Troponin negative.  CBC unremarkable.  Imaging studies:   CT head no acute disease  MRI brain 4 mm acute infarct upper pons, subacute appearing small infarct right periatrial white matter. Multiple remote lacunar infarcts.  Medical tests:   EKG independently reviewed sinus rhythm, no acute changes.  Test discussed with performing physician:    Decision to obtain old records:     Review and summation of old records:   Hospitalized April 2018 for acute nonhemorrhagic infarct right midbrain. Neurology recommended full dose aspirin. At that time echocardiogram and carotid Dopplers were unremarkable. LDL was 66 and the patient was continued on Lipitor. Hemoglobin A1c was 8.9.  Principal Problem:   Acute CVA (cerebrovascular accident) ([place]) Active Problems:   HTN (hypertension)   Blindness   T2DM (type 2 diabetes mellitus) ([place])   Hyperlipidemia   CKD (chronic kidney disease), stage III   Assessment/Plan Acute CVA.Vertigo. -Continue Lipitor. Was discharged in April on aspirin, however the patient reports that this medication was discontinued and he was started on a different medication by the [place], he worked cannot recall the name. -Patient had full stroke workup April 2018. Further recommendations per neurology consultation pending.  Essential hypertension -Stable. Hold Norvasc for now. Permissive hypertension.  Chronic kidney disease stage III -At baseline.  Diabetes type 2 -Stable. Anion gap within normal limits. Sliding scale insulin. Continue Lantus.  PMH glaucoma, right eye blindness. -Continue eyedrops right eye.  Severity of Illness: The appropriate patient status for this patient is INPATIENT. Inpatient status is judged to be reasonable and necessary in order to provide the required intensity of service to ensure the patient's safety. The patient's presenting symptoms, physical exam findings, and initial  radiographic and laboratory data in the context of their chronic comorbidities is felt to place them at high risk for further clinical deterioration. Furthermore, it is not anticipated that the patient will be medically stable for discharge from the hospital within 2 midnights of admission. The following factors support the patient status of inpatient.   * I certify that at the point of admission it is my clinical judgment that the patient will require inpatient hospital care spanning beyond 2 midnights from the point of admission due to high intensity of service, high risk for further deterioration and high frequency of surveillance required.*     DVT prophylaxis:SCDs Code Status: full Family Communication: none    Time spent: 60 minutes  Murray Hodgkins, MD  [facility] Direct contact: [phone] --[address] [place]  --[email or web address]; password [email or web address]  7PM-7AM contact night coverage as above  04/04/2017, 4:29 PM

## 2017-04-04 NOTE — [facility] Notes (Signed)
Patient passed swallow screen in the emergency room.  Dr. Sarajane Jews on unit and gave order for heart healthy/carb modified diet.

## 2017-04-04 NOTE — [facility] Notes (Signed)
SLP Cancellation Note  Patient Details Name: Trevor Crawford MRN: [id number] DOB: 16-Jun-1951   Cancelled treatment:       Reason Eval/Treat Not Completed: SLP screened, no needs identified, will sign off; SLP screened Pt in room. Pt denies any changes in swallowing, speech, language, or cognition.  SLE will be deferred at this time. Pt reports that the only changes with this stroke are with his vision and "dizziness". Pt lives at home with his wife. He has not driven for several years. Reconsult if indicated. SLP will sign off.   Thank you,  Genene Churn, [place]  [place] 04/04/2017, 5:49 PM

## 2017-04-05 ENCOUNTER — Telehealth: Payer: Self-pay

## 2017-04-05 DIAGNOSIS — I639 Cerebral infarction, unspecified: Secondary | ICD-10-CM

## 2017-04-05 LAB — GLUCOSE, CAPILLARY
GLUCOSE-CAPILLARY: 140 mg/dL — AB (ref 65–99)
GLUCOSE-CAPILLARY: 222 mg/dL — AB (ref 65–99)

## 2017-04-05 LAB — SEDIMENTATION RATE: Sed Rate: 35 mm/hr — ABNORMAL HIGH (ref 0–16)

## 2017-04-05 LAB — VITAMIN B12: VITAMIN B 12: 694 pg/mL (ref 180–914)

## 2017-04-05 LAB — TSH: TSH: 1.473 u[IU]/mL (ref 0.350–4.500)

## 2017-04-05 LAB — C-REACTIVE PROTEIN

## 2017-04-05 NOTE — Consult Note (Signed)
Trevor North A. Merlene Laughter, MD     [email or web address]          Trevor Crawford is an 66 y.o. male.   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 The patient presented with left-sided numbness and weakness about 4 months ago. He was diagnosed as having a midbrain stroke. Workup is outlined below but his lipid panel was unrevealing with only a slightly low HDL. He was placed on aspirin. He tells me that when he left the hospital this was discontinued and he was placed on antiplatelet agents presumedly Plavix. The patient presented with the acute onset of vertiginous symptoms described as a sensation as if people and objects are moving around him. He denies any associated dysarthria, dysphagia or worsening numbness on the left side. He did have some difficulties ambulating however. The review of systems otherwise negative.  GENERAL: Pleasant in no acute distress  HEENT: Supple. Atraumatic normocephalic. Poor dentition, clouding of the right cornea with unreactive pupil  ABDOMEN: soft  EXTREMITIES: No edema; there is a positive Tinel sign at the elbow on the right.   BACK: Normal.  SKIN: Normal by inspection.    MENTAL STATUS: Alert and oriented. Speech, language and cognition are generally intact. Judgment and insight normal.   CRANIAL NERVES: The left Pupil is round and  reactive to light and accommodation; extra ocular movements shows that the right pupil does not pass midline and associated with the left eye having a left beating nystagmus, there is mild left beating nystagmus with both pupils in mid position,  there is mild torsional nystagmus on vertical eye movement ; visual fields are full - OS ; upper and lower facial muscles are normal in strength and symmetric, there is no flattening of the nasolabial folds; tongue is midline; uvula is midline; shoulder elevation is normal.  MOTOR: There is a marked atrophy of the right dorsal interossei muscles especially the FDI on the right side. Normal tone, bulk and strength; no pronator drift.  COORDINATION: Left finger to nose is normal, right finger to nose is normal, No rest tremor; no intention tremor; no postural tremor; no bradykinesia.  REFLEXES: Deep tendon reflexes are symmetrical and normal. Babinski reflexes are flexor bilaterally.   SENSATION: Normal to light touch. The patient doesn't extinguish with double simultaneous stimulation.  NIH stroke scale 1.      Blood pressure (!) 145/78, pulse (!) 56, temperature (!) 97.5 F (36.4 C), temperature source Oral, resp. rate 16, height 6' (1.829 m), weight 204 lb (92.5 kg), SpO2 100 %.  Past Medical History:  Diagnosis Date  . Arthritis   . Cataract    bilateral  . Chronic kidney disease   . Depression   . Diabetes mellitus without complication ([place])   . GERD (gastroesophageal reflux disease)   . Glaucoma   . Hyperlipidemia   . Hypertension   . Legally blind in right eye, as defined  in [place]   . Neuromuscular disorder ([place])   . Neuropathy     Past Surgical History:  Procedure Laterality Date  . CATARACT EXTRACTION Left   . CYSTOSCOPY WITH RETROGRADE PYELOGRAM, URETEROSCOPY AND STENT PLACEMENT Left 10/06/2015   Procedure: CYSTOSCOPY WITH RETROGRADE PYELOGRAM, AND LEFT STENT PLACEMENT ;  Surgeon: Cleon Gustin, MD;  Location: [facility];   Service: Urology;  Laterality: Left;    Family History  Problem Relation Age of Onset  . Cancer Mother   . Alcoholism Father   . Cancer Father   . Cancer Other   . Cancer Sister   . Diabetes Brother   . Stroke Brother     Social History:  reports that he has been smoking Cigarettes.  He has a 55.00 pack-year smoking history. He has never used smokeless tobacco. He reports that he does not drink alcohol or use drugs.  Allergies: No Known Allergies  Medications: Prior to Admission medications   Medication Sig Start Date End Date Taking? Authorizing Provider  amLODipine (NORVASC) 5 MG tablet Take 1 tablet (5 mg total) by mouth daily. 11/23/16  Yes Kathie Dike, MD  atorvastatin (LIPITOR) 80 MG tablet Take 40 mg by mouth at bedtime.   Yes [provider]  brimonidine (ALPHAGAN) 0.2 % ophthalmic solution Place 1 drop into both eyes 2 (two) times daily.   Yes [provider]  carboxymethylcellulose (REFRESH PLUS) 0.5 % SOLN Apply 1 drop to eye 4 (four) times daily.   Yes [provider]  dorzolamide-timolol (COSOPT) 22.3-6.8 MG/ML ophthalmic solution 1 drop 2 (two) times daily.   Yes [provider]  furosemide (LASIX) 40 MG tablet Take 40 mg by mouth every morning.   Yes [provider]  Insulin Glargine (LANTUS SOLOSTAR) 100 UNIT/ML Solostar Pen Inject 24 Units into the skin daily.    Yes [provider]  omeprazole (PRILOSEC) 20 MG capsule Take 20 mg by mouth daily.   Yes [provider]  simethicone (MYLICON) 80 MG chewable tablet Chew 160 mg by mouth 2 (two) times daily as needed for flatulence.   Yes [provider]  tamsulosin (FLOMAX) 0.4 MG CAPS capsule Take 1 capsule (0.4 mg total) by mouth daily. 11/07/15  Yes Rancour, Annie Main, MD    Scheduled Meds: .  stroke: mapping our early stages of recovery book   Does not apply Once  . aspirin  324 mg Oral Daily  . atorvastatin  40 mg Oral QHS  . brimonidine  1  drop Right Eye BID  . dorzolamide-timolol  1 drop Right Eye BID  . furosemide  40 mg Oral q morning - 10a  . insulin aspart  0-9 Units Subcutaneous TID WC  . insulin glargine  24 Units Subcutaneous q morning - 10a  . pantoprazole  40 mg Oral Daily  . polyvinyl alcohol  1 drop Both Eyes QID  . sodium chloride flush  3 mL Intravenous Q12H  . sodium chloride flush  3 mL Intravenous Q12H  . tamsulosin  0.4 mg Oral Daily   Continuous Infusions: . sodium chloride     PRN Meds:.sodium chloride, acetaminophen **OR** acetaminophen, simethicone, sodium chloride flush     Results for orders placed or performed during the hospital encounter of 04/04/17 (from the past 48 hour(s))  Urine rapid drug screen (hosp performed)not at [facility]     Status: None   Collection Time: 04/04/17 10:37 AM  Result Value Ref Range   Opiates NONE DETECTED NONE DETECTED  Cocaine NONE DETECTED NONE DETECTED   Benzodiazepines NONE DETECTED NONE DETECTED   Amphetamines NONE DETECTED NONE DETECTED   Tetrahydrocannabinol NONE DETECTED NONE DETECTED   Barbiturates NONE DETECTED NONE DETECTED    Comment:        DRUG SCREEN FOR MEDICAL PURPOSES ONLY.  IF CONFIRMATION IS NEEDED FOR ANY PURPOSE, NOTIFY LAB WITHIN 5 DAYS.        LOWEST DETECTABLE LIMITS FOR URINE DRUG SCREEN Drug Class       Cutoff (ng/mL) Amphetamine      1000 Barbiturate      200 Benzodiazepine   588 Tricyclics       325 Opiates          300 Cocaine          300 THC              50   Urinalysis, Routine w reflex microscopic     Status: Abnormal   Collection Time: 04/04/17 10:37 AM  Result Value Ref Range   Color, Urine YELLOW YELLOW   APPearance CLEAR CLEAR   Specific Gravity, Urine 1.025 1.005 - 1.030   pH 5.5 5.0 - 8.0   Glucose, UA NEGATIVE NEGATIVE mg/dL   Hgb urine dipstick NEGATIVE NEGATIVE   Bilirubin Urine NEGATIVE NEGATIVE   Ketones, ur NEGATIVE NEGATIVE mg/dL   Protein, ur 100 (A) NEGATIVE mg/dL   Nitrite NEGATIVE NEGATIVE    Leukocytes, UA NEGATIVE NEGATIVE  Urinalysis, Microscopic (reflex)     Status: Abnormal   Collection Time: 04/04/17 10:37 AM  Result Value Ref Range   RBC / HPF 0-5 0 - 5 RBC/hpf   WBC, UA 6-30 0 - 5 WBC/hpf   Bacteria, UA FEW (A) NONE SEEN   Squamous Epithelial / LPF NONE SEEN NONE SEEN  Ethanol     Status: None   Collection Time: 04/04/17 10:50 AM  Result Value Ref Range   Alcohol, Ethyl (B) <5 <5 mg/dL    Comment:        LOWEST DETECTABLE LIMIT FOR SERUM ALCOHOL IS 5 mg/dL FOR MEDICAL PURPOSES ONLY   Protime-INR     Status: None   Collection Time: 04/04/17 10:50 AM  Result Value Ref Range   Prothrombin Time 12.7 11.4 - 15.2 seconds   INR 0.96   APTT     Status: None   Collection Time: 04/04/17 10:50 AM  Result Value Ref Range   aPTT 33 24 - 36 seconds  CBC     Status: Abnormal   Collection Time: 04/04/17 10:50 AM  Result Value Ref Range   WBC 6.6 4.0 - 10.5 K/uL   RBC 4.27 4.22 - 5.81 MIL/uL   Hemoglobin 12.5 (L) 13.0 - 17.0 g/dL   HCT 37.4 (L) 39.0 - 52.0 %   MCV 87.6 78.0 - 100.0 fL   MCH 29.3 26.0 - 34.0 pg   MCHC 33.4 30.0 - 36.0 g/dL   RDW 13.7 11.5 - 15.5 %   Platelets 192 150 - 400 K/uL  Differential     Status: None   Collection Time: 04/04/17 10:50 AM  Result Value Ref Range   Neutrophils Relative % 66 %   Neutro Abs 4.4 1.7 - 7.7 K/uL   Lymphocytes Relative 17 %   Lymphs Abs 1.2 0.7 - 4.0 K/uL   Monocytes Relative 10 %   Monocytes Absolute 0.7 0.1 - 1.0 K/uL   Eosinophils Relative 7 %   Eosinophils Absolute 0.4 0.0 - 0.7 K/uL  Basophils Relative 0 %   Basophils Absolute 0.0 0.0 - 0.1 K/uL  Comprehensive metabolic panel     Status: Abnormal   Collection Time: 04/04/17 10:50 AM  Result Value Ref Range   Sodium 139 135 - 145 mmol/L   Potassium 4.5 3.5 - 5.1 mmol/L   Chloride 107 101 - 111 mmol/L   CO2 24 22 - 32 mmol/L   Glucose, Bld 191 (H) 65 - 99 mg/dL   BUN 42 (H) 6 - 20 mg/dL   Creatinine, Ser 2.32 (H) 0.61 - 1.24 mg/dL   Calcium 9.2 8.9 -  10.3 mg/dL   Total Protein 6.6 6.5 - 8.1 g/dL   Albumin 3.5 3.5 - 5.0 g/dL   AST 15 15 - 41 U/L   ALT 16 (L) 17 - 63 U/L   Alkaline Phosphatase 88 38 - 126 U/L   Total Bilirubin 0.3 0.3 - 1.2 mg/dL   GFR calc non Af Amer 28 (L) >60 mL/min   GFR calc Af Amer 32 (L) >60 mL/min    Comment: (NOTE) The eGFR has been calculated using the CKD EPI equation. This calculation has not been validated in all clinical situations. eGFR's persistently <60 mL/min signify possible Chronic Kidney Disease.    Anion gap 8 5 - 15  I-Stat Chem 8, ED  (not at [facility], [facility])     Status: Abnormal   Collection Time: 04/04/17 10:58 AM  Result Value Ref Range   Sodium 141 135 - 145 mmol/L   Potassium 4.5 3.5 - 5.1 mmol/L   Chloride 108 101 - 111 mmol/L   BUN 37 (H) 6 - 20 mg/dL   Creatinine, Ser 2.30 (H) 0.61 - 1.24 mg/dL   Glucose, Bld 186 (H) 65 - 99 mg/dL   Calcium, Ion 1.22 1.15 - 1.40 mmol/L   TCO2 24 22 - 32 mmol/L   Hemoglobin 12.6 (L) 13.0 - 17.0 g/dL   HCT 37.0 (L) 39.0 - 52.0 %  I-stat troponin, ED (not at [facility], [facility])     Status: None   Collection Time: 04/04/17 11:10 AM  Result Value Ref Range   Troponin i, poc 0.00 0.00 - 0.08 ng/mL   Comment 3            Comment: Due to the release kinetics of cTnI, a negative result within the first hours of the onset of symptoms does not rule out myocardial infarction with certainty. If myocardial infarction is still suspected, repeat the test at appropriate intervals.   Glucose, capillary     Status: Abnormal   Collection Time: 04/04/17  5:12 PM  Result Value Ref Range   Glucose-Capillary 131 (H) 65 - 99 mg/dL  Glucose, capillary     Status: Abnormal   Collection Time: 04/04/17  8:57 PM  Result Value Ref Range   Glucose-Capillary 162 (H) 65 - 99 mg/dL   Comment 1 Notify RN    Comment 2 Document in Chart     Studies/Results:  BRAIN MRI 03-2017 FINDINGS: Brain: 4 mm focus of restricted diffusion in the upper pons along the floor of the fourth  ventricle. There is more mildly restricted diffusion medial to the atrium of the right lateral ventricle. There have been multiple remote lacunar infarcts in the brainstem and bilateral thalamus. Extensive chronic microvascular ischemic gliosis in the cerebral white matter. Although there is periventricular and infratentorial signal abnormality, the presence of lacunes and multiple vascular risk factors is more consistent with chronic small vessel disease. No acute hemorrhage,  hydrocephalus, or masslike findings. Remote hemorrhagic focus along the left temporal lobe medially and inferiorly. No generalized chronic blood products.  Vascular: Major flow voids are preserved.  Skull and upper cervical spine: Negative for marrow lesion.  Sinuses/Orbits: Cataract resection on the left. Asymmetric FLAIR hyperintensity in the right upper globe, patient is reportedly blind in the right eye.  Chronic sinusitis including mucous retention cyst filling much of the left frontal sinus.  IMPRESSION: 1. 4 mm acute infarct in the upper pons along the floor of the fourth ventricle. 2. More subacute appearing small infarct in the right periatrial white matter. 3. Advanced chronic small vessel ischemia with multiple remote lacunar infarcts.          PRIOR W/U   11/2016 ECHO - Left ventricle: The cavity size was normal. Wall thickness was increased in a pattern of mild LVH. Systolic function was normal. The estimated ejection fraction was in the range of 60% to 65%. Wall motion was normal; there were no regional wall motion abnormalities. Doppler parameters are consistent with abnormal left ventricular relaxation (grade 1 diastolic dysfunction). - Aortic valve: Valve area (VTI): 3.06 cm^2. Valve area (Vmax): 2.64 cm^2. Valve area (Vmean): 2.83 cm^2. - Atrial septum: No defect or patent foramen ovale was identified.   CAROTID DOPPLERS IMPRESSION: 1. Mild bilateral  common carotid, carotid bifurcation, proximal ICA atherosclerotic vascular disease. No flow limiting stenosis. Degree of stenosis less than 50%.  2. Vertebrals are patent with antegrade flow.     BRAIN MRA FINDINGS: The internal carotid arteries are within normal limits the high cervical segments through the ICA termini bilaterally. The A1 and M1 segments are normal. No definite anterior communicating artery is present. The MCA bifurcations are intact. ACA and MCA branch vessels are within normal limits.  The left vertebral artery is the dominant vessel. The right vertebral artery is not visualized. This may be hypoplastic or occluded. The left PICA origin is visualized and normal. The right AICA is dominant. The posterior cerebral arteries originate from the basilar tip. Distal PCA branch vessels are not well seen.  IMPRESSION: 1. Nonvisualization of the right vertebral artery. This may represent a hypoplastic or occluded vessel. 2. Dominant left vertebral artery. 3. No other significant proximal stenosis, aneurysm, or branch vessel occlusion.     BRAIN MRI FINDINGS: Brain: An acute nonhemorrhagic 5 mm infarct is present in the right midbrain, likely impacting the cortical spinal tracts. No other acute infarct is present.  Mild atrophy and moderate age advanced white matter disease is present bilaterally. There is a remote lacunar infarct in the left corona radiata. Remote lacunar infarcts are present in the thalami bilaterally. White matter changes extend into the brainstem. A remote lacunar infarct is present in the right pons. The cerebellum is normal bilaterally.  Vascular: Flow is present in the major intracranial arteries.  Skull and upper cervical spine: Skullbase is a normal limits. The craniocervical junction is normal. Midline sagittal structures are unremarkable.  Sinuses/Orbits: Mild mucosal thickening is present in the maxillary and  sinuses and ethmoid air cells, left greater than right. There is partial opacification of the left frontal sinus. Mucosal thickening is present in the right frontal sinus. The sphenoid sinuses are clear bilaterally. Fluid is present in the mastoid air cells bilaterally. No obstructing nasopharyngeal lesion is present.  IMPRESSION: 1. Acute 5 mm nonhemorrhagic infarct involving the right midbrain. Given the symptoms, this likely impacts the cortical spinal tracts. 2. Remote lacunar infarct just inferior in the right pons.  3. Remote lacunar infarcts of the thalami bilaterally. 4. Remote lacunar infarct of the left corona radiata. 5. Moderate age advanced white matter disease likely reflects the sequela of chronic microvascular ischemia.                Ayane Delancey A. Merlene Crawford, M.D.  Diplomate, Tax adviser of Psychiatry and Neurology ( Neurology). 04/05/2017, 7:59 AM

## 2017-04-05 NOTE — Evaluation (Signed)
Physical Therapy Evaluation Patient Details Name: Trevor Crawford MRN: [id number] DOB: [birth year] Today's Date: 04/05/2017   History of Present Illness  66 year old man PMH stroke, essential hypertension, hyperlipidemia, diabetes mellitus type 2, stroke April 2018, presented with greater than 24 hours of dizziness and ataxia. MRI brain revealed acute stroke.    Clinical Impression Patient demonstrates slow labored unsteady cadence appears slightly ataxic, drifts to the left, requires Min assist using SPC or RW x 35 feet, limited secondary to fatigue and c/o dizziness.  Patient declines use of RW for home due to limited space in his apartment and request the use of wheelchair for longer distances due to limited vision and unsteady gait.  Patient will benefit from continued physical therapy in hospital and recommended venue below to increase strength, balance, endurance for safe ADLs and gait.  Patient suffers from hx of CVA with decreased balance, limited gait which impairs their ability to perform daily activities like in the home.  A walker alone will not resolve the issues with performing activities of daily living. A wheelchair will allow patient to safely perform daily activities.  The patient can self propel in the home or has a caregiver who can provide assistance.       Follow Up Recommendations Home health PT;Supervision/Assistance - 24 hour    Equipment Recommendations  Wheelchair (measurements PT)    Recommendations for Other Services       Precautions / Restrictions Precautions Precautions: Fall Precaution Comments: due to dizziness Restrictions Weight Bearing Restrictions: No      Mobility  Bed Mobility Overal bed mobility: Independent                Transfers Overall transfer level: Needs assistance Equipment used: Rolling walker (2 wheeled);Straight cane Transfers: Sit to/from Stand Sit to Stand: Min guard            Ambulation/Gait Ambulation/Gait  assistance: Min guard Ambulation Distance (Feet): 35 Feet Assistive device: Standard walker;Straight cane Gait Pattern/deviations: Decreased step length - left;Decreased step length - right;Decreased stride length   Gait velocity interpretation: Below normal speed for age/gender    Stairs            Wheelchair Mobility    Modified [place] (Stroke Patients Only)       Balance Overall balance assessment: Needs assistance Sitting-balance support: No upper extremity supported;Feet supported Sitting balance-Leahy Scale: Good     Standing balance support: Bilateral upper extremity supported;Single extremity supported Standing balance-Leahy Scale: Fair                               Pertinent Vitals/Pain Pain Assessment: No/denies pain    Home Living Family/patient expects to be discharged to:: Private residence Living Arrangements: Spouse/significant other Available Help at Discharge: Available 24 hours/day Type of Home: Apartment Home Access: Level entry     Home Layout: One level Home Equipment: Cane - single point      Prior Function Level of Independence: Independent with assistive device(s)   Gait / Transfers Assistance Needed: Pt uses cane for ambulation.  Pt states he does not go out into the community due to being blind and just got his eyesight back last friday after the operation.    ADL's / Homemaking Assistance Needed: inependent with both dressing and bathing.    Comments: Cane for functional mobility in the community     Hand Dominance   Dominant Hand: Right    Extremity/Trunk  Assessment   Upper Extremity Assessment Upper Extremity Assessment: Defer to OT evaluation    Lower Extremity Assessment Lower Extremity Assessment: Overall WFL for tasks assessed    Cervical / Trunk Assessment Cervical / Trunk Assessment: Normal  Communication   Communication: No difficulties  Cognition Arousal/Alertness: Awake/alert Behavior During  Therapy: WFL for tasks assessed/performed Overall Cognitive Status: Within Functional Limits for tasks assessed                                        General Comments      Exercises     Assessment/Plan    PT Assessment Patient needs continued PT services  PT Problem List Decreased strength;Decreased activity tolerance;Decreased balance;Decreased mobility       PT Treatment Interventions Gait training;Functional mobility training;Therapeutic exercise;Therapeutic activities;Neuromuscular re-education    PT Goals (Current goals can be found in the Care Plan section)  Acute Rehab PT Goals Patient Stated Goal: Return home with spouse to assist PT Goal Formulation: With patient Time For Goal Achievement: 04/12/17 Potential to Achieve Goals: Good    Frequency Min 3X/week   Barriers to discharge        Co-evaluation               AM-PAC PT "6 Clicks" Daily Activity  Outcome Measure Difficulty turning over in bed (including adjusting bedclothes, sheets and blankets)?: None Difficulty moving from lying on back to sitting on the side of the bed? : None Difficulty sitting down on and standing up from a chair with arms (e.g., wheelchair, bedside commode, etc,.)?: None Help needed moving to and from a bed to chair (including a wheelchair)?: A Little Help needed walking in hospital room?: A Little Help needed climbing 3-5 steps with a railing? : A Lot 6 Click Score: 20    End of Session Equipment Utilized During Treatment: Gait belt Activity Tolerance: Patient limited by fatigue Patient left: in bed;with call bell/phone within reach (seated at bedside) Nurse Communication: Mobility status PT Visit Diagnosis: Unsteadiness on feet (R26.81);Other abnormalities of gait and mobility (R26.89);Muscle weakness (generalized) (M62.81)    Time: 1884-1660 PT Time Calculation (min) (ACUTE ONLY): 30 min   Charges:   PT Evaluation $PT Eval Low Complexity: 1 Low PT  Treatments $Therapeutic Activity: 23-37 mins   PT G Codes:        11:11 AM, 04-28-17 Lonell Grandchild, MPT Physical Therapist with [facility] [phone] office [phone] mobile phone

## 2017-04-05 NOTE — Care Management (Signed)
    Durable Medical Equipment        Start     Ordered   04/05/17 1100  For home use only DME 4 wheeled rolling walker with seat  Once    Question:  Patient needs a walker to treat with the following condition  Answer:  CVA (cerebral vascular accident) ([place])   04/05/17 1059    \

## 2017-04-05 NOTE — Telephone Encounter (Signed)
Dr Sarajane Jews called Trevor Crawford asking for 30 day event monitor for pt of Dr Merlene Laughter, s/p CVA, order placed, e-cardio to mail to pt's home    Spoke with April and they will await monitor

## 2017-04-05 NOTE — Progress Notes (Addendum)
Hypoglycemic Event  CBG: 50  Treatment: Orange Juice and graham crackers   Symptoms: asymptomatic  Follow-up CBG: [id number] CBG Result:140  Possible Reasons for Event: receives insulins  Comments/MD notified [place]     Tine Mabee

## 2017-04-05 NOTE — Care Management Note (Signed)
Case Management Note  Patient Details  Name: Trevor Crawford MRN: [id number] Date of Birth: [birth year]  Subjective/Objective:                  Pt admitted with CVA. He is from home with wife. He was very ind pta. He's now having deficits he hope resolve quickly. He goes to [place] for PCP care (Dr. Meyer Russel) but is established with Dr. Wendi Snipes at [place]. Pt needs HH PT and rollator, has no preference of HH and DME providers.   Action/Plan: CM verified pt is established with [facility]. Appointment made for next week. CM attempted to contact [place] [facility] but was unable, no option to leave VM with transfer coordinator. CM will fax DC summary as fax number still working. Vaughan Basta, [facility] rep, given [place] and DME referral, will obtain pt info from chart and deliver DME to pt room prior to DC.   Expected Discharge Date:  04/07/17               Expected Discharge Plan:  [place]  In-House Referral:  NA, GIP  Discharge planning Services  CM Consult  Post Acute Care Choice:  Home Health, Durable Medical Equipment Choice offered to:  Patient, Spouse  DME Arranged:  Walker rolling with seat DME Agency:  [employer]:  PT, OT [facility] Agency:  [place]  Status of Service:  Completed, signed off  Sherald Barge, RN 04/05/2017, 11:38 AM

## 2017-04-05 NOTE — Discharge Summary (Addendum)
Physician Discharge Summary  Trevor Crawford OTL:[id number] DOB: 08/20/1950 DOA: 04/04/2017  PCP: [facility]  Admit date: 04/04/2017 Discharge date: 04/05/2017  Recommendations for Outpatient Follow-up:  1. Follow-up stroke. Patient reports he follows with neurology at [facility]. Recommend follow-up in 1-2 months. 2. Neurology recommended 30 day event monitor to exclude occult dysrhythmia. This has been arranged. 3. Patient was seen by occupational therapy, recommended supervision, outpatient OT low vision evaluation of symptoms improved   Follow-up Information    Timmothy Euler, MD Follow up on 04/11/2017.   Specialty:  Family Medicine Why:  @ 2:55 pm Contact information: [place] [address] [phone]            Discharge Diagnoses:  1. Acute CVA 2. Essential hypertension 3. Chronic kidney disease stage III 4. Diabetes mellitus type 2  Discharge Condition: Improved Disposition: home with [facility] PT  Diet recommendation: heart healthy, diabetic diet  Filed Weights   04/04/17 1021  Weight: 92.5 kg (204 lb)    History of present illness:  66 year old man PMH stroke, essential hypertension, hyperlipidemia, diabetes mellitus type 2, stroke April 2018, presented with greater than 24 hours of dizziness and ataxia. MRI brain revealed acute stroke.  Hospital Course:  Patient was admitted overnight, no new deficits were noted. Patient had recently had a full stroke evaluation in April of this year and therefore this was not repeated. The following day visual disturbance was less pronounced and he was able to ambulate. He was seen by neurology in consultation. Assessment was multiple lacunar infarcts with respect factors including previous infarct, age and hypertension. Additional laboratory studies including RPR, homocysteine, ESR, C-reactive protein, TSH and vitamin B12 were ordered. 30 day event monitor was recommended for possible  atrial fibrillation, this was arranged. Continue antiplatelet agent. Patient was seen by occupational therapy, recommended supervision, outpatient OT low vision evaluation of symptoms improved. Seen by physical therapy, home health recommended. Other issues including diabetes, essential hypertension chronic kidney disease stage III remained stable.  Please note that the patient was discharged in April on aspirin but he follows at the [facility], aspirin was discontinued and he was started on a different agent, presumably Plavix. He was instructed to continue this medication although it does not appear in his medical reconciliation.  Consultant:  Neurology.  Today's assessment: S: Feels better. Visual disturbance has decreased. Ambulating okay. No new deficits. No weakness or numbness of the arms or legs. Still veering to the side when walking. O: Vitals: I 7.7, 16, 58, 147/76, 97% on room air   Constitutional. Appears calm, comfortable.  Respiratory. Clear to auscultation bilaterally. No wheezes, rales or rhonchi. Normal respiratory effort.  Cardiovascular. Regular rate and rhythm. No murmur, rub or gallop.  Neurologic. No change noted on examination. Observed ambulating in the hallway with Rollator.  Musculoskeletal. Grossly normal tone and strength noted.  Psychiatric. Grossly normal mood and affect. Speech fluent and appropriate.  Discharge Instructions  Discharge Instructions    Diet - low sodium heart healthy    Complete by:  As directed    Diet Carb Modified    Complete by:  As directed    Discharge instructions    Complete by:  As directed    Call your physician or seek immediate medical attention for weakness, numbness, difficulty speaking or swallowing, dizziness or worsening of condition. Call your neurologist at the [facility] to arrange follow-up in 1 month. Be sure to continue to take the medication you  received from the [place]. The neurologist here has recommended a  heart monitor for 30 days.   Increase activity slowly    Complete by:  As directed      Allergies as of 04/05/2017   No Known Allergies     Medication List    TAKE these medications   amLODipine 5 MG tablet Commonly known as:  NORVASC Take 1 tablet (5 mg total) by mouth daily.   atorvastatin 80 MG tablet Commonly known as:  LIPITOR Take 40 mg by mouth at bedtime.   brimonidine 0.2 % ophthalmic solution Commonly known as:  ALPHAGAN Place 1 drop into both eyes 2 (two) times daily.   carboxymethylcellulose 0.5 % Soln Commonly known as:  REFRESH PLUS Apply 1 drop to eye 4 (four) times daily.   dorzolamide-timolol 22.3-6.8 MG/ML ophthalmic solution Commonly known as:  COSOPT 1 drop 2 (two) times daily.   furosemide 40 MG tablet Commonly known as:  LASIX Take 40 mg by mouth every morning.   LANTUS SOLOSTAR 100 UNIT/ML Solostar Pen Generic drug:  Insulin Glargine Inject 24 Units into the skin daily.   omeprazole 20 MG capsule Commonly known as:  PRILOSEC Take 20 mg by mouth daily.   simethicone 80 MG chewable tablet Commonly known as:  MYLICON Chew 161 mg by mouth 2 (two) times daily as needed for flatulence.   tamsulosin 0.4 MG Caps capsule Commonly known as:  FLOMAX Take 1 capsule (0.4 mg total) by mouth daily.            Durable Medical Equipment        Start     Ordered   04/05/17 1100  For home use only DME 4 wheeled rolling walker with seat  Once    Question:  Patient needs a walker to treat with the following condition  Answer:  CVA (cerebral vascular accident) ([place])   04/05/17 1059       Discharge Care Instructions        Start     Ordered   04/05/17 0000  Increase activity slowly     04/05/17 1158   04/05/17 0000  Diet - low sodium heart healthy     04/05/17 1158   04/05/17 0000  Discharge instructions    Comments:  Call your physician or seek immediate medical attention for weakness, numbness, difficulty speaking or swallowing, dizziness  or worsening of condition. Call your neurologist at the [facility] to arrange follow-up in 1 month. Be sure to continue to take the medication you received from the [place]. The neurologist here has recommended a heart monitor for 30 days.   04/05/17 1158   04/05/17 0000  Diet Carb Modified     04/05/17 1158     No Known Allergies  The results of significant diagnostics from this hospitalization (including imaging, microbiology, ancillary and laboratory) are listed below for reference.    Significant Diagnostic Studies: Ct Head Wo Contrast  Result Date: 04/04/2017 CLINICAL DATA:  Focal neurological deficit for greater than 6 hours. Stroke suspected. EXAM: CT HEAD WITHOUT CONTRAST TECHNIQUE: Contiguous axial images were obtained from the base of the skull through the vertex without intravenous contrast. COMPARISON:  11/21/2016 FINDINGS: Brain: Again noted is low-density throughout the white matter suggesting chronic changes. Patchy low-density areas in the thalami appear to be chronic. No evidence for acute hemorrhage, mass lesion, midline shift, hydrocephalus or new large infarct. Vascular: No hyperdense vessel or unexpected calcification. Skull: No acute bone abnormality. Sinuses/Orbits: There is mucosal  disease in the frontal sinuses and ethmoid air cells. Other: None. IMPRESSION: No acute intracranial abnormality. Evidence for chronic small vessel ischemic disease. Electronically Signed   By: Markus Daft M.D.   On: 04/04/2017 12:10   Mr Brain Wo Contrast  Result Date: 04/04/2017 CLINICAL DATA:  Subacute neuro deficit. Stroke suspected. Room spinning. EXAM: MRI HEAD WITHOUT CONTRAST TECHNIQUE: Multiplanar, multiecho pulse sequences of the brain and surrounding structures were obtained without intravenous contrast. COMPARISON:  Head CT from earlier today.  Brain MRI 11/21/2016 FINDINGS: Brain: 4 mm focus of restricted diffusion in the upper pons along the floor of the fourth ventricle. There is more mildly  restricted diffusion medial to the atrium of the right lateral ventricle. There have been multiple remote lacunar infarcts in the brainstem and bilateral thalamus. Extensive chronic microvascular ischemic gliosis in the cerebral white matter. Although there is periventricular and infratentorial signal abnormality, the presence of lacunes and multiple vascular risk factors is more consistent with chronic small vessel disease. No acute hemorrhage, hydrocephalus, or masslike findings. Remote hemorrhagic focus along the left temporal lobe medially and inferiorly. No generalized chronic blood products. Vascular: Major flow voids are preserved. Skull and upper cervical spine: Negative for marrow lesion. Sinuses/Orbits: Cataract resection on the left. Asymmetric FLAIR hyperintensity in the right upper globe, patient is reportedly blind in the right eye. Chronic sinusitis including mucous retention cyst filling much of the left frontal sinus. IMPRESSION: 1. 4 mm acute infarct in the upper pons along the floor of the fourth ventricle. 2. More subacute appearing small infarct in the right periatrial white matter. 3. Advanced chronic small vessel ischemia with multiple remote lacunar infarcts. Electronically Signed   By: Monte Fantasia M.D.   On: 04/04/2017 13:07   Labs: Basic Metabolic Panel:  Recent Labs Lab 04/04/17 1050 04/04/17 1058  NA 139 141  K 4.5 4.5  CL 107 108  CO2 24  --   GLUCOSE 191* 186*  BUN 42* 37*  CREATININE 2.32* 2.30*  CALCIUM 9.2  --    Liver Function Tests:  Recent Labs Lab 04/04/17 1050  AST 15  ALT 16*  ALKPHOS 88  BILITOT 0.3  PROT 6.6  ALBUMIN 3.5   CBC:  Recent Labs Lab 04/04/17 1050 04/04/17 1058  WBC 6.6  --   NEUTROABS 4.4  --   HGB 12.5* 12.6*  HCT 37.4* 37.0*  MCV 87.6  --   PLT 192  --     CBG:  Recent Labs Lab 04/04/17 1712 04/04/17 2057 04/05/17 0830 04/05/17 1126  GLUCAP 131* 162* 140* 222*    Principal Problem:   Acute CVA  (cerebrovascular accident) ([place]) Active Problems:   HTN (hypertension)   Blindness   T2DM (type 2 diabetes mellitus) ([place])   Hyperlipidemia   CKD (chronic kidney disease), stage III   Time coordinating discharge: 35 minutes  Signed:  Murray Hodgkins, MD [facility] 04/05/2017, 4:27 PM

## 2017-04-05 NOTE — Evaluation (Signed)
Occupational Therapy Evaluation Patient Details Name: Trevor Crawford MRN: [id number] DOB: 1951-04-02 Today's Date: 04/05/2017    History of Present Illness 66 year old man PMH stroke, essential hypertension, hyperlipidemia, diabetes mellitus type 2, stroke April 2018, presented with greater than 24 hours of dizziness and ataxia. MRI brain revealed acute stroke.   Clinical Impression   Pt received semi-reclined in bed, agreeable to OT evaluation. Pt is at baseline with ADL completion, limited in standing tasks and functional mobility completion due to dizziness. Pt reports objects in room are moving and jumping, no double vision at this time. Pt is legally blind in right eye at baseline, and is reporting blurred vision and dizziness. Pt inconsistent in vision testing-notable visual field testing, appearing unable to maintain focus in order to complete the task. Pt does report his vision has improved since admission, as he is now able to sit up and sit at the EOB whereas his symptoms worsened in this position previously. Pt did complete sit to stand during evaluation, functional mobility not completed due to dizziness. Recommend outpatient OT low vision evaluation if symptoms have not resolved or improved in the next 4-6 weeks. No further acute OT services required at this time, recommend 24/7 supervision which his wife can provide per pt.     Follow Up Recommendations  Outpatient OT;Other (comment) (if pt vision deficits are not resolved in the near future)    Equipment Recommendations  None recommended by OT       Precautions / Restrictions Precautions Precautions: Fall Precaution Comments: due to dizziness Restrictions Weight Bearing Restrictions: No      Mobility Bed Mobility Overal bed mobility: Independent                Transfers Overall transfer level: Needs assistance Equipment used: Straight cane Transfers: Sit to/from Stand Sit to Stand: Min guard                   ADL either performed or assessed with clinical judgement   ADL Overall ADL's : Needs assistance/impaired     Grooming: Modified independent;Sitting Grooming Details (indicate cue type and reason): unable to complete in standing due to dizziness Upper Body Bathing: Modified independent;Sitting   Lower Body Bathing: Modified independent   Upper Body Dressing : Independent   Lower Body Dressing: Independent   Toilet Transfer: Loss adjuster, chartered Details (indicate cue type and reason): assistance required due to dizziness Toileting- Clothing Manipulation and Hygiene: Modified independent;Sitting/lateral lean         General ADL Comments: Pt requiring assistance for standing tasks due to dizziness, unable to complete functional mobility due to increased dizziness with standing     Vision Baseline Vision/History: Legally blind (right eye) Patient Visual Report: Blurring of vision Vision Assessment?: Yes Eye Alignment: Within Functional Limits Ocular Range of Motion: Within Functional Limits Alignment/Gaze Preference: Within Defined Limits Tracking/Visual Pursuits: Decreased smoothness of vertical tracking;Impaired - to be further tested in functional context Convergence: Within functional limits Visual Fields: Right visual field deficit (due to blindness)            Pertinent Vitals/Pain Pain Assessment: No/denies pain     Hand Dominance Right   Extremity/Trunk Assessment Upper Extremity Assessment Upper Extremity Assessment: Overall WFL for tasks assessed   Lower Extremity Assessment Lower Extremity Assessment: Defer to PT evaluation   Cervical / Trunk Assessment Cervical / Trunk Assessment: Normal   Communication Communication Communication: No difficulties   Cognition Arousal/Alertness: Awake/alert Behavior During Therapy: [facility]  for tasks assessed/performed Overall Cognitive Status: Within Functional Limits for tasks assessed                                                 Home Living Family/patient expects to be discharged to:: Private residence Living Arrangements: Spouse/significant other Available Help at Discharge: Available 24 hours/day Type of Home: Apartment Home Access: Level entry     Home Layout: One level     Bathroom Shower/Tub: Teacher, early years/pre: Handicapped height     Home Equipment: [place] - single point          Prior Functioning/Environment Level of Independence: Independent with assistive device(s)        Comments: Cane for functional mobility in the community        OT Problem List: Impaired vision/perception;Impaired balance (sitting and/or standing)       End of Session Equipment Utilized During Treatment: Gait belt [facility])  Activity Tolerance: Patient tolerated treatment well Patient left: in bed;with call bell/phone within reach  OT Visit Diagnosis: Unsteadiness on feet (R26.81);Other (comment);Ataxia, unspecified (R27.0) (vision deficit)                Time: 6378-5885 OT Time Calculation (min): 16 min Charges:  OT General Charges $OT Visit: 1 Visit OT Evaluation $OT Eval Low Complexity: [place], OTR/L  [phone] 04/05/2017, 8:28 AM

## 2017-04-05 NOTE — Progress Notes (Signed)
IV removed, telemetry removed, tolerated well. Patient given discharge instructions at bedside.

## 2017-04-06 LAB — RPR: RPR Ser Ql: NONREACTIVE

## 2017-04-09 LAB — HOMOCYSTEINE: HOMOCYSTEINE-NORM: 21.9 umol/L — AB (ref 0.0–15.0)

## 2017-04-11 ENCOUNTER — Ambulatory Visit (INDEPENDENT_AMBULATORY_CARE_PROVIDER_SITE_OTHER): Payer: Medicare HMO | Admitting: Family Medicine

## 2017-04-11 ENCOUNTER — Ambulatory Visit (INDEPENDENT_AMBULATORY_CARE_PROVIDER_SITE_OTHER): Payer: Medicare HMO

## 2017-04-11 ENCOUNTER — Encounter: Payer: Self-pay | Admitting: Family Medicine

## 2017-04-11 VITALS — BP 133/80 | HR 68 | Temp 96.9°F | Ht 73.0 in | Wt 205.0 lb

## 2017-04-11 DIAGNOSIS — I10 Essential (primary) hypertension: Secondary | ICD-10-CM

## 2017-04-11 DIAGNOSIS — I639 Cerebral infarction, unspecified: Secondary | ICD-10-CM | POA: Diagnosis not present

## 2017-04-11 DIAGNOSIS — N183 Chronic kidney disease, stage 3 unspecified: Secondary | ICD-10-CM

## 2017-04-11 DIAGNOSIS — E1142 Type 2 diabetes mellitus with diabetic polyneuropathy: Secondary | ICD-10-CM | POA: Diagnosis not present

## 2017-04-11 DIAGNOSIS — I6990 Unspecified sequelae of unspecified cerebrovascular disease: Secondary | ICD-10-CM

## 2017-04-11 DIAGNOSIS — Z794 Long term (current) use of insulin: Secondary | ICD-10-CM

## 2017-04-11 LAB — BAYER DCA HB A1C WAIVED: HB A1C: 8.7 % — AB (ref <7.0)

## 2017-04-11 NOTE — Progress Notes (Signed)
   HPI  Patient presents today here for hospital follow-up.  Patient was admitted with acute CVA. After developing ataxia and dizziness. Patient states that he has not completely regained his strength but he is doing well overall.  We reviewed his medications and he does not have any questions.  Diabetes Patient follows up regularly at the [place]. He reports that his previous A1c was 8.5. His Lantus was recently increased 24 units and he has had 2 episodes of hypoglycemia since that time in the 50s and one in the 60s. His average fasting blood sugar is normally 130-150  Blood pressure Good medication compliance, no chest pain or headaches  PMH: Smoking status noted ROS: Per HPI  Objective: BP 133/80   Pulse 68   Temp (!) 96.9 F (36.1 C) (Oral)   Ht '6\' 1"'$  (1.854 m)   Wt 205 lb (93 kg)   BMI 27.05 kg/m  Gen: NAD, alert, cooperative with exam HEENT: NCAT CV: RRR, good S1/S2, no murmur Resp: CTABL, no wheezes, non-labored Ext: No edema, warm Neuro: Alert and oriented, strength 5/5 and symmetric in bilateral lower extremities, walking with a cane  Diabetic Foot Exam - Simple   Simple Foot Form Visual Inspection See comments:  Yes Sensation Testing Intact to touch and monofilament testing bilaterally:  Yes Pulse Check Posterior Tibialis and Dorsalis pulse intact bilaterally:  Yes Comments Right foot with thick callus and great toe and distal medial forefoot      Assessment and plan:  # Late effect of CVA Patient reports still some blurred vision and diffuse weakness Pt reports antiplatelet - does not have meds here, rx at the [place]  # Hypertension Well-controlled, no changes  # Type 2 diabetes Uncontrolled, patient actually with hypoglycemia so I have decreased his Lantus. For long-term follow-up he will continue going to the [place]. A1c pending  # Chronic kidney disease stage III Repeat labs Clinically asymptomatic     Orders Placed This Encounter  Procedures    . CMP14+EGFR  . [facility] Hb A1c Carney Bern, MD [facility] 04/11/2017, 3:58 PM

## 2017-04-11 NOTE — Patient Instructions (Signed)
Great to see you!  Come back in 3 months unless you need [place] sooner. If you are following up at the [place] then there is no need to comebcak here but you should be seen every 3 months  Decrease your Lantus to 22 units

## 2017-04-12 LAB — CMP14+EGFR
ALT: 18 IU/L (ref 0–44)
AST: 15 IU/L (ref 0–40)
Albumin/Globulin Ratio: 1.3 (ref 1.2–2.2)
Albumin: 3.9 g/dL (ref 3.6–4.8)
Alkaline Phosphatase: 98 IU/L (ref 39–117)
BUN/Creatinine Ratio: 18 (ref 10–24)
BUN: 39 mg/dL — AB (ref 8–27)
CALCIUM: 9.3 mg/dL (ref 8.6–10.2)
CHLORIDE: 102 mmol/L (ref 96–106)
CO2: 20 mmol/L (ref 20–29)
Creatinine, Ser: 2.19 mg/dL — ABNORMAL HIGH (ref 0.76–1.27)
GFR, EST AFRICAN AMERICAN: 35 mL/min/{1.73_m2} — AB (ref >59)
GFR, EST NON AFRICAN AMERICAN: 30 mL/min/{1.73_m2} — AB (ref >59)
GLUCOSE: 250 mg/dL — AB (ref 65–99)
Globulin, Total: 3 g/dL (ref 1.5–4.5)
Potassium: 4.8 mmol/L (ref 3.5–5.2)
Sodium: 138 mmol/L (ref 134–144)
TOTAL PROTEIN: 6.9 g/dL (ref 6.0–8.5)

## 2017-06-12 ENCOUNTER — Emergency Department (HOSPITAL_COMMUNITY)
Admission: EM | Admit: 2017-06-12 | Discharge: 2017-06-12 | Disposition: A | Discharge status: Home / Self Care | Payer: Medicare Other | Attending: Emergency Medicine | Admitting: Emergency Medicine

## 2017-06-12 ENCOUNTER — Emergency Department (HOSPITAL_COMMUNITY): Payer: Medicare Other

## 2017-06-12 ENCOUNTER — Encounter (HOSPITAL_COMMUNITY): Payer: Self-pay | Admitting: Emergency Medicine

## 2017-06-12 DIAGNOSIS — H1132 Conjunctival hemorrhage, left eye: Secondary | ICD-10-CM | POA: Diagnosis not present

## 2017-06-12 DIAGNOSIS — H5440 Blindness, one eye, unspecified eye: Secondary | ICD-10-CM | POA: Diagnosis not present

## 2017-06-12 DIAGNOSIS — I129 Hypertensive chronic kidney disease with stage 1 through stage 4 chronic kidney disease, or unspecified chronic kidney disease: Secondary | ICD-10-CM | POA: Insufficient documentation

## 2017-06-12 DIAGNOSIS — H547 Unspecified visual loss: Secondary | ICD-10-CM | POA: Diagnosis present

## 2017-06-12 DIAGNOSIS — F17210 Nicotine dependence, cigarettes, uncomplicated: Secondary | ICD-10-CM | POA: Diagnosis not present

## 2017-06-12 DIAGNOSIS — E113593 Type 2 diabetes mellitus with proliferative diabetic retinopathy without macular edema, bilateral: Secondary | ICD-10-CM | POA: Diagnosis not present

## 2017-06-12 DIAGNOSIS — H4051X3 Glaucoma secondary to other eye disorders, right eye, severe stage: Secondary | ICD-10-CM | POA: Diagnosis not present

## 2017-06-12 DIAGNOSIS — H5462 Unqualified visual loss, left eye, normal vision right eye: Secondary | ICD-10-CM | POA: Diagnosis not present

## 2017-06-12 DIAGNOSIS — N183 Chronic kidney disease, stage 3 (moderate): Secondary | ICD-10-CM | POA: Insufficient documentation

## 2017-06-12 DIAGNOSIS — Z794 Long term (current) use of insulin: Secondary | ICD-10-CM | POA: Diagnosis not present

## 2017-06-12 DIAGNOSIS — R51 Headache: Secondary | ICD-10-CM | POA: Insufficient documentation

## 2017-06-12 DIAGNOSIS — Z79899 Other long term (current) drug therapy: Secondary | ICD-10-CM | POA: Insufficient documentation

## 2017-06-12 DIAGNOSIS — H4312 Vitreous hemorrhage, left eye: Secondary | ICD-10-CM | POA: Diagnosis not present

## 2017-06-12 HISTORY — DX: Cerebral infarction, unspecified: I63.9

## 2017-06-12 LAB — DIFFERENTIAL
BASOS ABS: 0 10*3/uL (ref 0.0–0.1)
Basophils Relative: 0 %
EOS ABS: 0.4 10*3/uL (ref 0.0–0.7)
EOS PCT: 5 %
LYMPHS ABS: 1.6 10*3/uL (ref 0.7–4.0)
LYMPHS PCT: 20 %
MONOS PCT: 9 %
Monocytes Absolute: 0.7 10*3/uL (ref 0.1–1.0)
Neutro Abs: 5.2 10*3/uL (ref 1.7–7.7)
Neutrophils Relative %: 66 %

## 2017-06-12 LAB — PROTIME-INR
INR: 0.9
Prothrombin Time: 12 seconds (ref 11.4–15.2)

## 2017-06-12 LAB — COMPREHENSIVE METABOLIC PANEL
ALK PHOS: 102 U/L (ref 38–126)
ALT: 16 U/L — ABNORMAL LOW (ref 17–63)
AST: 14 U/L — AB (ref 15–41)
Albumin: 3.6 g/dL (ref 3.5–5.0)
Anion gap: 10 (ref 5–15)
BILIRUBIN TOTAL: 0.5 mg/dL (ref 0.3–1.2)
BUN: 34 mg/dL — AB (ref 6–20)
CALCIUM: 9.3 mg/dL (ref 8.9–10.3)
CO2: 24 mmol/L (ref 22–32)
Chloride: 102 mmol/L (ref 101–111)
Creatinine, Ser: 2.18 mg/dL — ABNORMAL HIGH (ref 0.61–1.24)
GFR calc Af Amer: 35 mL/min — ABNORMAL LOW (ref >60)
GFR, EST NON AFRICAN AMERICAN: 30 mL/min — AB (ref >60)
GLUCOSE: 133 mg/dL — AB (ref 65–99)
Potassium: 4.3 mmol/L (ref 3.5–5.1)
Sodium: 136 mmol/L (ref 135–145)
TOTAL PROTEIN: 7.1 g/dL (ref 6.5–8.1)

## 2017-06-12 LAB — RAPID URINE DRUG SCREEN, HOSP PERFORMED
AMPHETAMINES: NOT DETECTED
BARBITURATES: NOT DETECTED
Benzodiazepines: NOT DETECTED
Cocaine: NOT DETECTED
OPIATES: NOT DETECTED
Tetrahydrocannabinol: NOT DETECTED

## 2017-06-12 LAB — CBC
HEMATOCRIT: 39 % (ref 39.0–52.0)
HEMOGLOBIN: 12.9 g/dL — AB (ref 13.0–17.0)
MCH: 29.4 pg (ref 26.0–34.0)
MCHC: 33.1 g/dL (ref 30.0–36.0)
MCV: 88.8 fL (ref 78.0–100.0)
Platelets: 197 10*3/uL (ref 150–400)
RBC: 4.39 MIL/uL (ref 4.22–5.81)
RDW: 13.5 % (ref 11.5–15.5)
WBC: 7.8 10*3/uL (ref 4.0–10.5)

## 2017-06-12 LAB — URINALYSIS, ROUTINE W REFLEX MICROSCOPIC
BILIRUBIN URINE: NEGATIVE
Glucose, UA: 100 mg/dL — AB
Hgb urine dipstick: NEGATIVE
Ketones, ur: NEGATIVE mg/dL
LEUKOCYTES UA: NEGATIVE
NITRITE: NEGATIVE
PH: 5.5 (ref 5.0–8.0)
Specific Gravity, Urine: 1.01 (ref 1.005–1.030)

## 2017-06-12 LAB — I-STAT CHEM 8, ED
BUN: 33 mg/dL — AB (ref 6–20)
CHLORIDE: 106 mmol/L (ref 101–111)
CREATININE: 2.2 mg/dL — AB (ref 0.61–1.24)
Calcium, Ion: 1.19 mmol/L (ref 1.15–1.40)
GLUCOSE: 128 mg/dL — AB (ref 65–99)
HCT: 40 % (ref 39.0–52.0)
Hemoglobin: 13.6 g/dL (ref 13.0–17.0)
Potassium: 4.3 mmol/L (ref 3.5–5.1)
SODIUM: 140 mmol/L (ref 135–145)
TCO2: 24 mmol/L (ref 22–32)

## 2017-06-12 LAB — CBG MONITORING, ED: Glucose-Capillary: 117 mg/dL — ABNORMAL HIGH (ref 65–99)

## 2017-06-12 LAB — ETHANOL: Alcohol, Ethyl (B): <10 mg/dL (ref <10)

## 2017-06-12 LAB — I-STAT TROPONIN, ED: TROPONIN I, POC: 0.01 ng/mL (ref 0.00–0.08)

## 2017-06-12 LAB — URINALYSIS, MICROSCOPIC (REFLEX)

## 2017-06-12 LAB — APTT: aPTT: 32 seconds (ref 24–36)

## 2017-06-12 MED ORDER — FLUORESCEIN SODIUM 1 MG OP STRP
1.0000 | ORAL_STRIP | Freq: Once | OPHTHALMIC | Status: DC
  Filled 2017-06-12: qty 1

## 2017-06-12 MED ORDER — TETRACAINE HCL 0.5 % OP SOLN
2.0000 [drp] | Freq: Once | OPHTHALMIC | Status: DC
  Filled 2017-06-12: qty 4

## 2017-06-12 NOTE — ED Notes (Signed)
Per Dr. Rogene Houston, pt is able to have a light snack, but that's it.

## 2017-06-12 NOTE — ED Notes (Signed)
[place] is currently broken. Will notify MD

## 2017-06-12 NOTE — ED Notes (Signed)
Pt to CT

## 2017-06-12 NOTE — ED Notes (Signed)
Pt.'s wife is upset that patient has not had anything to eat. Have explained to her that it is better to wait for Dr. To look over CT scan before eating.

## 2017-06-12 NOTE — ED Notes (Signed)
MD Zackowski notified of pt's sx, orders received.

## 2017-06-12 NOTE — ED Notes (Signed)
Pt not able to see any letter on visual acuity screening chart.

## 2017-06-12 NOTE — ED Triage Notes (Signed)
Pt reports waking up this morning with sudden vision loss in L eye, pt is blind in R eye. Denies HA, slurred speech, gait difficulties, or weakness to one side. Per pt's wife he is confused as of this morning. He reports he went to bed last night at approx 2300.

## 2017-06-12 NOTE — Discharge Instructions (Signed)
The eye surgeon Dr. Alanda Slim will see you in his office today.  Go directly there.

## 2017-06-12 NOTE — ED Provider Notes (Addendum)
[facility] EMERGENCY DEPARTMENT Provider Note   CSN: [id number] Arrival date & time: 06/12/17  1112     History   Chief Complaint Chief Complaint  Patient presents with  . Loss of Vision    HPI Gaylen Venning Deyton is a 66 y.o. male.  Patient went to bed last night around in the evening had good vision out of his left eye.  Previous blindness in right eye from glaucoma.  Patient status post hemorrhage left eye and cataract surgery with lens replacement back in April.  Patient awoke this morning without hardly any vision in the left eye.  Could just perceive a little bit of light.  No pain associated with it.  Patient has a history of diabetes.      Past Medical History:  Diagnosis Date  . Arthritis   . Cataract    bilateral  . Chronic kidney disease   . Depression   . Diabetes mellitus without complication ([place])   . GERD (gastroesophageal reflux disease)   . Glaucoma   . Hyperlipidemia   . Hypertension   . Legally blind in right eye, as defined in [place]   . Neuromuscular disorder ([place])   . Neuropathy   . Stroke [facility])     Patient Active Problem List   Diagnosis Date Noted  . Late effects of CVA (cerebrovascular accident) 04/11/2017  . CVA (cerebral vascular accident) ([place]) 04/04/2017  . CKD (chronic kidney disease), stage III ([place]) 04/04/2017  . Acute CVA (cerebrovascular accident) ([place]) 11/21/2016  . Glaucoma 11/21/2016  . Hyperlipidemia 11/21/2016  . GERD (gastroesophageal reflux disease) 11/21/2016  . Depression 05/07/2016  . HTN (hypertension) 05/07/2016  . Blindness 05/07/2016  . BPH (benign prostatic hyperplasia) 05/07/2016  . T2DM (type 2 diabetes mellitus) ([place]) 05/07/2016  . Renal calculi 10/06/2015  . Ureteral calculus, left 10/05/2015    Past Surgical History:  Procedure Laterality Date  . CATARACT EXTRACTION Left        Home Medications    Prior to Admission medications   Medication Sig Start Date End Date Taking? Authorizing Provider    amLODipine (NORVASC) 5 MG tablet Take 1 tablet (5 mg total) by mouth daily. 11/23/16  Yes Kathie Dike, MD  atorvastatin (LIPITOR) 80 MG tablet Take 40 mg by mouth at bedtime.   Yes [provider]  brimonidine (ALPHAGAN) 0.2 % ophthalmic solution Place 1 drop into both eyes 2 (two) times daily.   Yes [provider]  carboxymethylcellulose (REFRESH PLUS) 0.5 % SOLN Apply 1 drop to eye 4 (four) times daily.   Yes [provider]  dorzolamide-timolol (COSOPT) 22.3-6.8 MG/ML ophthalmic solution 1 drop 2 (two) times daily.   Yes [provider]  furosemide (LASIX) 40 MG tablet Take 40 mg by mouth every morning.   Yes [provider]  Insulin Glargine (LANTUS SOLOSTAR) 100 UNIT/ML Solostar Pen Inject 29 Units daily into the skin.    Yes [provider]  omeprazole (PRILOSEC) 20 MG capsule Take 20 mg by mouth daily.   Yes [provider]  simethicone (MYLICON) 80 MG chewable tablet Chew 160 mg by mouth 2 (two) times daily as needed for flatulence.   Yes [provider]  tamsulosin (FLOMAX) 0.4 MG CAPS capsule Take 1 capsule (0.4 mg total) by mouth daily. 11/07/15  Yes Rancour, Annie Main, MD    Family History Family History  Problem Relation Age of Onset  . Cancer Mother   . Alcoholism Father   . Cancer Father   .  Cancer Other   . Cancer Sister   . Diabetes Brother   . Stroke Brother     Social History Social History   Tobacco Use  . Smoking status: Current Every Day Smoker    Packs/day: 1.00    Years: 55.00    Pack years: 55.00    Types: Cigarettes  . Smokeless tobacco: Never Used  Substance Use Topics  . Alcohol use: No  . Drug use: No     Allergies   Patient has no known allergies.   Review of Systems Review of Systems  Constitutional: Negative for fever.  HENT: Negative for congestion.   Eyes: Positive for visual disturbance. Negative for pain and discharge.  Respiratory: Negative for shortness of  breath.   Cardiovascular: Negative for chest pain.  Gastrointestinal: Negative for abdominal pain.  Musculoskeletal: Negative for back pain.  Skin: Negative for rash.  Neurological: Negative for headaches.  Hematological: Does not bruise/bleed easily.  Psychiatric/Behavioral: Negative for confusion.     Physical Exam Updated Vital Signs BP 129/79   Pulse 63   Temp 97.9 F (36.6 C) (Oral)   Resp 16   Ht 1.829 m (6')   Wt 95.3 kg (210 lb)   SpO2 98%   BMI 28.48 kg/m   Physical Exam  Constitutional: He is oriented to person, place, and time. He appears well-developed and well-nourished. No distress.  HENT:  Head: Normocephalic and atraumatic.  Right Ear: External ear normal.  Left Ear: External ear normal.  Mouth/Throat: Oropharynx is clear and moist.  Eyes: EOM are normal.  Right eye no vision left eye pupil about 3 mm.  No red light reflex.  Not able to visualize retina.  Intraocular pressure was 13.  No hyphema.  Neck: Normal range of motion. Neck supple.  Cardiovascular: Normal rate and regular rhythm.  Pulmonary/Chest: Effort normal and breath sounds normal. No respiratory distress.  Abdominal: Soft. Bowel sounds are normal.  Musculoskeletal: Normal range of motion. He exhibits no edema.  Neurological: He is alert and oriented to person, place, and time. He exhibits normal muscle tone. Coordination normal.  Skin: Skin is warm.  Nursing note and vitals reviewed.    ED Treatments / Results  Labs (all labs ordered are listed, but only abnormal results are displayed) Labs Reviewed  CBC - Abnormal; Notable for the following components:      Result Value   Hemoglobin 12.9 (*)    All other components within normal limits  COMPREHENSIVE METABOLIC PANEL - Abnormal; Notable for the following components:   Glucose, Bld 133 (*)    BUN 34 (*)    Creatinine, Ser 2.18 (*)    AST 14 (*)    ALT 16 (*)    GFR calc non Af Amer 30 (*)    GFR calc Af Amer 35 (*)    All other  components within normal limits  CBG MONITORING, ED - Abnormal; Notable for the following components:   Glucose-Capillary 117 (*)    All other components within normal limits  I-STAT CHEM 8, ED - Abnormal; Notable for the following components:   BUN 33 (*)    Creatinine, Ser 2.20 (*)    Glucose, Bld 128 (*)    All other components within normal limits  PROTIME-INR  APTT  DIFFERENTIAL  ETHANOL  RAPID URINE DRUG SCREEN, HOSP PERFORMED  URINALYSIS, ROUTINE W REFLEX MICROSCOPIC  I-STAT TROPONIN, ED  I-STAT CHEM 8, ED  I-STAT TROPONIN, ED    EKG  EKG  Interpretation None      ED ECG REPORT   Date: 06/12/2017  Rate: 68  Rhythm: normal sinus rhythm  QRS Axis: normal  Intervals: normal  ST/T Wave abnormalities: nonspecific ST changes  Conduction Disutrbances:right bundle branch block and left anterior fascicular block  Narrative Interpretation:   Old EKG Reviewed: none available  I have personally reviewed the EKG tracing and agree with the computerized printout as noted.     Radiology Ct Head Wo Contrast  Result Date: 06/12/2017 CLINICAL DATA:  66 year old male with acute left vision loss. EXAM: CT HEAD WITHOUT CONTRAST TECHNIQUE: Contiguous axial images were obtained from the base of the skull through the vertex without intravenous contrast. COMPARISON:  04/04/2017 CT, MR and prior studies FINDINGS: Brain: No evidence of acute infarction, hemorrhage, hydrocephalus, extra-axial collection or mass lesion/mass effect. Chronic small-vessel white matter ischemic changes and remote infarcts in the lines and bilateral bowel my again noted. Vascular: Intracranial atherosclerotic calcifications noted. Skull: Normal. Negative for fracture or focal lesion. Sinuses/Orbits: No acute finding. Other: None. IMPRESSION: 1. No evidence of acute intracranial abnormality 2. Chronic small-vessel white matter ischemic changes and remote infarcts as described. Electronically Signed   By: Margarette [place]  M.D.   On: 06/12/2017 12:32    Procedures Procedures (including critical care time)  Medications Ordered in ED Medications  fluorescein ophthalmic strip 1 strip (not administered)  tetracaine (PONTOCAINE) 0.5 % ophthalmic solution 2 drop (not administered)     Initial Impression / Assessment and Plan / ED Course  I have reviewed the triage vital signs and the nursing notes.  Pertinent labs & imaging results that were available during my care of the patient were reviewed by me and considered in my medical decision making (see chart for details).    Discussed with ophthalmology Dr. Alanda Slim will see him in the office today.  This most likely represents a vitreous hemorrhage.  Patient's ocular pressure was 13.  No pain in the eye.  Woke this morning with the significantly change in vision but can only just perceive light.  Prior to going to bed last night at around 11:00 and see fine.   Final Clinical Impressions(s) / ED Diagnoses   Final diagnoses:  Vision loss of left eye    ED Discharge Orders    None       Fredia Sorrow, MD 06/12/17 1517    Fredia Sorrow, MD 06/12/17 [phone]

## 2017-06-12 NOTE — ED Notes (Signed)
MD at bedside. 

## 2017-06-22 ENCOUNTER — Inpatient Hospital Stay (HOSPITAL_COMMUNITY)
Admission: EM | Admit: 2017-06-22 | Discharge: 2017-06-24 | DRG: 065 | Disposition: A | Discharge status: Home Health | Payer: Medicare Other | Attending: Internal Medicine | Admitting: Internal Medicine

## 2017-06-22 ENCOUNTER — Encounter (HOSPITAL_COMMUNITY): Payer: Self-pay | Admitting: Emergency Medicine

## 2017-06-22 ENCOUNTER — Emergency Department (HOSPITAL_COMMUNITY): Payer: Medicare Other

## 2017-06-22 ENCOUNTER — Other Ambulatory Visit: Payer: Self-pay

## 2017-06-22 DIAGNOSIS — R2990 Unspecified symptoms and signs involving the nervous system: Secondary | ICD-10-CM | POA: Diagnosis not present

## 2017-06-22 DIAGNOSIS — Z794 Long term (current) use of insulin: Secondary | ICD-10-CM | POA: Diagnosis not present

## 2017-06-22 DIAGNOSIS — I6381 Other cerebral infarction due to occlusion or stenosis of small artery: Principal | ICD-10-CM | POA: Diagnosis present

## 2017-06-22 DIAGNOSIS — I10 Essential (primary) hypertension: Secondary | ICD-10-CM | POA: Diagnosis not present

## 2017-06-22 DIAGNOSIS — Z7902 Long term (current) use of antithrombotics/antiplatelets: Secondary | ICD-10-CM

## 2017-06-22 DIAGNOSIS — Z823 Family history of stroke: Secondary | ICD-10-CM

## 2017-06-22 DIAGNOSIS — H548 Legal blindness, as defined in USA: Secondary | ICD-10-CM | POA: Diagnosis present

## 2017-06-22 DIAGNOSIS — Z79899 Other long term (current) drug therapy: Secondary | ICD-10-CM | POA: Diagnosis not present

## 2017-06-22 DIAGNOSIS — I6330 Cerebral infarction due to thrombosis of unspecified cerebral artery: Secondary | ICD-10-CM

## 2017-06-22 DIAGNOSIS — E11649 Type 2 diabetes mellitus with hypoglycemia without coma: Secondary | ICD-10-CM | POA: Diagnosis present

## 2017-06-22 DIAGNOSIS — I639 Cerebral infarction, unspecified: Secondary | ICD-10-CM

## 2017-06-22 DIAGNOSIS — E160 Drug-induced hypoglycemia without coma: Secondary | ICD-10-CM | POA: Diagnosis not present

## 2017-06-22 DIAGNOSIS — N400 Enlarged prostate without lower urinary tract symptoms: Secondary | ICD-10-CM | POA: Diagnosis present

## 2017-06-22 DIAGNOSIS — F329 Major depressive disorder, single episode, unspecified: Secondary | ICD-10-CM | POA: Diagnosis not present

## 2017-06-22 DIAGNOSIS — Z7983 Long term (current) use of bisphosphonates: Secondary | ICD-10-CM | POA: Diagnosis not present

## 2017-06-22 DIAGNOSIS — H409 Unspecified glaucoma: Secondary | ICD-10-CM | POA: Diagnosis present

## 2017-06-22 DIAGNOSIS — F17200 Nicotine dependence, unspecified, uncomplicated: Secondary | ICD-10-CM

## 2017-06-22 DIAGNOSIS — Z833 Family history of diabetes mellitus: Secondary | ICD-10-CM | POA: Diagnosis not present

## 2017-06-22 DIAGNOSIS — F17210 Nicotine dependence, cigarettes, uncomplicated: Secondary | ICD-10-CM | POA: Diagnosis not present

## 2017-06-22 DIAGNOSIS — I129 Hypertensive chronic kidney disease with stage 1 through stage 4 chronic kidney disease, or unspecified chronic kidney disease: Secondary | ICD-10-CM | POA: Diagnosis not present

## 2017-06-22 DIAGNOSIS — R531 Weakness: Secondary | ICD-10-CM | POA: Diagnosis not present

## 2017-06-22 DIAGNOSIS — R262 Difficulty in walking, not elsewhere classified: Secondary | ICD-10-CM | POA: Diagnosis not present

## 2017-06-22 DIAGNOSIS — K219 Gastro-esophageal reflux disease without esophagitis: Secondary | ICD-10-CM | POA: Diagnosis present

## 2017-06-22 DIAGNOSIS — R471 Dysarthria and anarthria: Secondary | ICD-10-CM | POA: Diagnosis present

## 2017-06-22 DIAGNOSIS — E1122 Type 2 diabetes mellitus with diabetic chronic kidney disease: Secondary | ICD-10-CM | POA: Diagnosis not present

## 2017-06-22 DIAGNOSIS — E1151 Type 2 diabetes mellitus with diabetic peripheral angiopathy without gangrene: Secondary | ICD-10-CM | POA: Diagnosis present

## 2017-06-22 DIAGNOSIS — R260 Ataxic gait: Secondary | ICD-10-CM | POA: Diagnosis present

## 2017-06-22 DIAGNOSIS — T383X5A Adverse effect of insulin and oral hypoglycemic [antidiabetic] drugs, initial encounter: Secondary | ICD-10-CM | POA: Diagnosis not present

## 2017-06-22 DIAGNOSIS — R29810 Facial weakness: Secondary | ICD-10-CM | POA: Diagnosis present

## 2017-06-22 DIAGNOSIS — E785 Hyperlipidemia, unspecified: Secondary | ICD-10-CM | POA: Diagnosis not present

## 2017-06-22 DIAGNOSIS — Z960 Presence of urogenital implants: Secondary | ICD-10-CM | POA: Diagnosis not present

## 2017-06-22 DIAGNOSIS — E1140 Type 2 diabetes mellitus with diabetic neuropathy, unspecified: Secondary | ICD-10-CM | POA: Diagnosis present

## 2017-06-22 DIAGNOSIS — I69354 Hemiplegia and hemiparesis following cerebral infarction affecting left non-dominant side: Secondary | ICD-10-CM | POA: Diagnosis not present

## 2017-06-22 DIAGNOSIS — Z716 Tobacco abuse counseling: Secondary | ICD-10-CM

## 2017-06-22 DIAGNOSIS — N183 Chronic kidney disease, stage 3 (moderate): Secondary | ICD-10-CM | POA: Diagnosis not present

## 2017-06-22 DIAGNOSIS — Z8673 Personal history of transient ischemic attack (TIA), and cerebral infarction without residual deficits: Secondary | ICD-10-CM

## 2017-06-22 LAB — CBC
HCT: 38.6 % — ABNORMAL LOW (ref 39.0–52.0)
HEMOGLOBIN: 12.7 g/dL — AB (ref 13.0–17.0)
MCH: 28.7 pg (ref 26.0–34.0)
MCHC: 32.9 g/dL (ref 30.0–36.0)
MCV: 87.1 fL (ref 78.0–100.0)
Platelets: 178 10*3/uL (ref 150–400)
RBC: 4.43 MIL/uL (ref 4.22–5.81)
RDW: 13.4 % (ref 11.5–15.5)
WBC: 7.1 10*3/uL (ref 4.0–10.5)

## 2017-06-22 LAB — COMPREHENSIVE METABOLIC PANEL
ALBUMIN: 3.8 g/dL (ref 3.5–5.0)
ALT: 23 U/L (ref 17–63)
AST: 18 U/L (ref 15–41)
Alkaline Phosphatase: 101 U/L (ref 38–126)
Anion gap: 7 (ref 5–15)
BILIRUBIN TOTAL: 0.6 mg/dL (ref 0.3–1.2)
BUN: 44 mg/dL — AB (ref 6–20)
CHLORIDE: 108 mmol/L (ref 101–111)
CO2: 25 mmol/L (ref 22–32)
Calcium: 9.5 mg/dL (ref 8.9–10.3)
Creatinine, Ser: 2.59 mg/dL — ABNORMAL HIGH (ref 0.61–1.24)
GFR calc Af Amer: 28 mL/min — ABNORMAL LOW (ref >60)
GFR calc non Af Amer: 24 mL/min — ABNORMAL LOW (ref >60)
GLUCOSE: 140 mg/dL — AB (ref 65–99)
POTASSIUM: 4.3 mmol/L (ref 3.5–5.1)
Sodium: 140 mmol/L (ref 135–145)
Total Protein: 7.2 g/dL (ref 6.5–8.1)

## 2017-06-22 LAB — I-STAT CHEM 8, ED
BUN: 44 mg/dL — AB (ref 6–20)
CHLORIDE: 106 mmol/L (ref 101–111)
CREATININE: 2.7 mg/dL — AB (ref 0.61–1.24)
Calcium, Ion: 1.09 mmol/L — ABNORMAL LOW (ref 1.15–1.40)
Glucose, Bld: 137 mg/dL — ABNORMAL HIGH (ref 65–99)
HEMATOCRIT: 40 % (ref 39.0–52.0)
HEMOGLOBIN: 13.6 g/dL (ref 13.0–17.0)
POTASSIUM: 4.6 mmol/L (ref 3.5–5.1)
Sodium: 140 mmol/L (ref 135–145)
TCO2: 24 mmol/L (ref 22–32)

## 2017-06-22 LAB — DIFFERENTIAL
BASOS ABS: 0 10*3/uL (ref 0.0–0.1)
BASOS PCT: 0 %
Eosinophils Absolute: 0.3 10*3/uL (ref 0.0–0.7)
Eosinophils Relative: 4 %
LYMPHS ABS: 1.2 10*3/uL (ref 0.7–4.0)
Lymphocytes Relative: 16 %
Monocytes Absolute: 0.5 10*3/uL (ref 0.1–1.0)
Monocytes Relative: 7 %
NEUTROS ABS: 5.1 10*3/uL (ref 1.7–7.7)
NEUTROS PCT: 73 %

## 2017-06-22 LAB — PROTIME-INR
INR: 0.91
Prothrombin Time: 12.2 seconds (ref 11.4–15.2)

## 2017-06-22 LAB — I-STAT TROPONIN, ED: Troponin i, poc: 0 ng/mL (ref 0.00–0.08)

## 2017-06-22 LAB — APTT: APTT: 32 s (ref 24–36)

## 2017-06-22 LAB — ETHANOL: Alcohol, Ethyl (B): <10 mg/dL (ref <10)

## 2017-06-22 NOTE — ED Provider Notes (Signed)
[facility] EMERGENCY DEPARTMENT Provider Note   CSN: [id number] Arrival date & time: 06/22/17  1746     History   Chief Complaint Chief Complaint  Patient presents with  . Weakness    HPI Trevor Crawford is a 66 y.o. male.  HPI  The pt is a 66 y/o male - hx of multiple strokes in the past including in 8/18 - had confirmed acute MRI ischemic infarct - he is on anticoagulation - noted that yesterday he started having difficulty speaking with slurred speech, difficulty with ambulation and states that he has had to walk with a walker with GREAT difficulty - he had trouble getting himself dressed b/c of weakness in his leg - on the L and some waeakness in the LUE as well - he reports that all of this is abnormal and new.  The patient's significant other is here with him as well and notes that these changes have been ongoing for over 24 hours but the patient was resistant to come to the hospital yesterday.  He has no fevers, no chest pain, no coughing or shortness of breath and no difficulty urinating except that he has been missing the toilet when he tries to go to the bathroom.  He urinates all over the floor.  He reports that his vision is almost completely blind in the left eye and totally blind in the right eye, his vision has overall been worsening over time.  His significant other also states that he has been excessively sleepy over the last several days and generally weak to the point where he is not stable on his feet.  Past Medical History:  Diagnosis Date  . Arthritis   . Cataract    bilateral  . Chronic kidney disease   . Depression   . Diabetes mellitus without complication ([place])   . GERD (gastroesophageal reflux disease)   . Glaucoma   . Hyperlipidemia   . Hypertension   . Legally blind in right eye, as defined in [place]   . Neuromuscular disorder ([place])   . Neuropathy   . Stroke [facility])     Patient Active Problem List   Diagnosis Date Noted  . Late effects of CVA  (cerebrovascular accident) 04/11/2017  . CVA (cerebral vascular accident) ([place]) 04/04/2017  . CKD (chronic kidney disease), stage III ([place]) 04/04/2017  . Acute CVA (cerebrovascular accident) ([place]) 11/21/2016  . Glaucoma 11/21/2016  . Hyperlipidemia 11/21/2016  . GERD (gastroesophageal reflux disease) 11/21/2016  . Depression 05/07/2016  . HTN (hypertension) 05/07/2016  . Blindness 05/07/2016  . BPH (benign prostatic hyperplasia) 05/07/2016  . T2DM (type 2 diabetes mellitus) ([place]) 05/07/2016  . Renal calculi 10/06/2015  . Ureteral calculus, left 10/05/2015    Past Surgical History:  Procedure Laterality Date  . CATARACT EXTRACTION Left   . CYSTOSCOPY WITH RETROGRADE PYELOGRAM, AND LEFT STENT PLACEMENT Left 10/06/2015   Performed by Cleon Gustin, MD at [facility] ORS       Home Medications    Prior to Admission medications   Medication Sig Start Date End Date Taking? Authorizing Provider  amLODipine (NORVASC) 5 MG tablet Take 1 tablet (5 mg total) by mouth daily. 11/23/16   Kathie Dike, MD  atorvastatin (LIPITOR) 80 MG tablet Take 40 mg by mouth at bedtime.    [provider]  brimonidine (ALPHAGAN) 0.2 % ophthalmic solution Place 1 drop into both eyes 2 (two) times daily.    [provider]  carboxymethylcellulose (REFRESH PLUS) 0.5 % SOLN  Apply 1 drop to eye 4 (four) times daily.    [provider]  dorzolamide-timolol (COSOPT) 22.3-6.8 MG/ML ophthalmic solution 1 drop 2 (two) times daily.    [provider]  furosemide (LASIX) 40 MG tablet Take 40 mg by mouth every morning.    [provider]  Insulin Glargine (LANTUS SOLOSTAR) 100 UNIT/ML Solostar Pen Inject 29 Units daily into the skin.     [provider]  omeprazole (PRILOSEC) 20 MG capsule Take 20 mg by mouth daily.    [provider]  simethicone (MYLICON) 80 MG chewable tablet Chew 160 mg by mouth 2 (two) times daily as needed for flatulence.    [provider]  tamsulosin (FLOMAX) 0.4 MG CAPS capsule Take 1 capsule (0.4 mg total) by mouth daily. 11/07/15   Ezequiel Essex, MD    Family History Family History  Problem Relation Age of Onset  . Cancer Mother   . Alcoholism Father   . Cancer Father   . Cancer Other   . Cancer Sister   . Diabetes Brother   . Stroke Brother     Social History Social History   Tobacco Use  . Smoking status: Current Every Day Smoker    Packs/day: 1.00    Years: 55.00    Pack years: 55.00    Types: Cigarettes  . Smokeless tobacco: Never Used  Substance Use Topics  . Alcohol use: No  . Drug use: No     Allergies   Patient has no known allergies.   Review of Systems Review of Systems  All other systems reviewed and are negative.    Physical Exam Updated Vital Signs BP 135/74   Pulse 66   Temp (!) 97.5 F (36.4 C) (Oral)   Resp 14   Ht 6' (1.829 m)   Wt 95.3 kg (210 lb)   SpO2 98%   BMI 28.48 kg/m   Physical Exam  Constitutional: He appears well-developed and well-nourished. No distress.  HENT:  Head: Normocephalic and atraumatic.  Mouth/Throat: Oropharynx is clear and moist. No oropharyngeal exudate.  Eyes: Conjunctivae and EOM are normal. Pupils are equal, round, and reactive to light. Right eye exhibits no discharge. Left eye exhibits no discharge. No scleral icterus.  Neck: Normal range of motion. Neck supple. No JVD present. No thyromegaly present.  Cardiovascular: Normal rate, regular rhythm, normal heart sounds and intact distal pulses. Exam reveals no gallop and no friction rub.  No murmur heard. Pulmonary/Chest: Effort normal and breath sounds normal. No respiratory distress. He has no wheezes. He has no rales.  Abdominal: Soft. Bowel sounds are normal. He exhibits no distension and no mass. There is no tenderness.  Musculoskeletal: Normal range of motion. He exhibits no edema or tenderness.  Lymphadenopathy:    He has no cervical adenopathy.  Neurological: He  is alert. Coordination normal.  The patient is able to lift both arms into the air without pronator drift but has difficulty with the left hand doing finger-nose-finger and has some dysmetria.  His right upper extremity has normal strength and he is able to do finger-nose-finger without difficulty.  He has symmetrical strength of his lower extremities but states that the left one feels heavy.  There is a slight decreased sensation to the left leg arm and the left side of the face.  There is a slight left-sided facial droop, there is possible slight slurring of his speech.  He is able to follow all commands otherwise.  Cranial nerves  III through XII appear normal except for the slight facial droop and a slight decreased sensation on the face.  Skin: Skin is warm and dry. No rash noted. No erythema.  Psychiatric: He has a normal mood and affect. His behavior is normal.  Nursing note and vitals reviewed.    ED Treatments / Results  Labs (all labs ordered are listed, but only abnormal results are displayed) Labs Reviewed  CBC - Abnormal; Notable for the following components:      Result Value   Hemoglobin 12.7 (*)    HCT 38.6 (*)    All other components within normal limits  COMPREHENSIVE METABOLIC PANEL - Abnormal; Notable for the following components:   Glucose, Bld 140 (*)    BUN 44 (*)    Creatinine, Ser 2.59 (*)    GFR calc non Af Amer 24 (*)    GFR calc Af Amer 28 (*)    All other components within normal limits  I-STAT CHEM 8, ED - Abnormal; Notable for the following components:   BUN 44 (*)    Creatinine, Ser 2.70 (*)    Glucose, Bld 137 (*)    Calcium, Ion 1.09 (*)    All other components within normal limits  ETHANOL  PROTIME-INR  APTT  DIFFERENTIAL  RAPID URINE DRUG SCREEN, HOSP PERFORMED  URINALYSIS, ROUTINE W REFLEX MICROSCOPIC  I-STAT TROPONIN, ED    EKG  EKG Interpretation  Date/Time:  Saturday June 22 2017 18:13:10 EST Ventricular Rate:  78 PR Interval:      QRS Duration: 109 QT Interval:  400 QTC Calculation: 456 R Axis:   137 Text Interpretation:  Sinus rhythm Atrial premature complex Right axis deviation Abnormal R-wave progression, early transition since last tracing no significant change Confirmed by Noemi Chapel [phone]) on 06/22/2017 6:18:13 PM       Radiology Dg Chest 1 View  Result Date: 06/22/2017 CLINICAL DATA:  Staggering gait and loss of balance EXAM: CHEST 1 VIEW COMPARISON:  11/21/2016 FINDINGS: The heart size and mediastinal contours are within normal limits. Both lungs are clear. The visualized skeletal structures are unremarkable. IMPRESSION: No active disease. Electronically Signed   By: Inez Catalina M.D.   On: 06/22/2017 18:55   Ct Head Wo Contrast  Result Date: 06/22/2017 CLINICAL DATA:  Slurred speech and staggering gait EXAM: CT HEAD WITHOUT CONTRAST TECHNIQUE: Contiguous axial images were obtained from the base of the skull through the vertex without intravenous contrast. COMPARISON:  06/22/2017 FINDINGS: Brain: Mild atrophic changes and chronic white matter ischemic changes are again seen and stable. No findings to suggest acute hemorrhage, acute infarction or space-occupying mass lesion are seen. Vascular: No hyperdense vessel or unexpected calcification. Skull: Normal. Negative for fracture or focal lesion. Sinuses/Orbits: No acute finding. Other: None. IMPRESSION: Chronic atrophic and ischemic changes without acute abnormality. Electronically Signed   By: Inez Catalina M.D.   On: 06/22/2017 18:51    Procedures Procedures (including critical care time)  Medications Ordered in ED Medications - No data to display   Initial Impression / Assessment and Plan / ED Course  I have reviewed the triage vital signs and the nursing notes.  Pertinent labs & imaging results that were available during my care of the patient were reviewed by me and considered in my medical decision making (see chart for details).    It is  difficult to test visual acuity with the patient's baseline poor visual acuity, CT scan of the head was ordered, the patient is  outside of any stroke window for acute treatment with thrombolytic or neuro interventional treatment as well.  I will discuss his case with the hospitalist who will likely need to admit the patient to the hospital for worsening strokelike symptoms.  Based on prior records it is not clear what antiplatelet agent the patient has been taking since the [place] hospital changed his medications in April.  He does not have any record of anticoagulation in the records in the computer, I will ask family to obtain his medications so that we can update his list.  Teleneurology requested. D/w Teleneurologist - requests pt transfer to MRI containing Stroke center for the rest of the w/u.  Agreeable to avoid TPA with pt > 24 hours since onset.  Dr. Darrick Meigs to see and arrange transfer  Final Clinical Impressions(s) / ED Diagnoses   Final diagnoses:  Cerebrovascular accident (CVA), unspecified mechanism [facility])    ED Discharge Orders    None       Noemi Chapel, MD 06/22/17 2012

## 2017-06-22 NOTE — ED Notes (Signed)
Patient to radiology at this time.

## 2017-06-22 NOTE — ED Triage Notes (Signed)
Pt's wife states pt woke up yesterday with staggering gait, loss of balance, slurred speech, and generalized weakness.

## 2017-06-22 NOTE — ED Notes (Signed)
Pt informed of need for urine sample and provided with urinal to catch sample when able

## 2017-06-22 NOTE — H&P (Signed)
TRH H&P    Patient Demographics:    Trevor Crawford, is a 66 y.o. male  MRN: [id number]  DOB - May 10, 1951  Admit Date - 06/22/2017  Referring MD/NP/PA: Dr. Sabra Heck  Outpatient Primary MD for the patient is Timmothy Euler, MD  Patient coming from: Home  Chief Complaint  Patient presents with  . Weakness      HPI:    Trevor Crawford  is a 66 y.o. male,.. With history of multiple strokes in the past including one on 818 which had ischemic infarct confirmed on MRI.  Patient is on a stroke prevention pill given by [facility] as per patient and his wife.  Patient started having difficulty speaking and also had difficulty ambulating with weakness in both legs.  He also complained of weakness of left upper extremity.  Symptoms have been going on for the past 24 hours.  This morning when he woke up he had the symptoms of generalized weakness of lower extremities with left upper extremity weakness. Patient is blind in right eye, and also has had vision loss from left eye due to underlying glaucoma.  He was seen by ophthalmology on 06/12/2017, and had vitreous hemorrhage in the left eye.  In the ED, CT head was negative for stroke. Tele neurology was consulted and they recommended patient to be transferred to [facility] for MRI brain.  He denies chest pain or shortness of breath. Denies nausea vomiting or diarrhea. No previous history of seizures. He denies dysuria, urgency or frequency of urination. He denies fever or chills.     Review of systems:      All other systems reviewed and are negative.   With Past History of the following :    Past Medical History:  Diagnosis Date  . Arthritis   . Cataract    bilateral  . Chronic kidney disease   . Depression   . Diabetes mellitus without complication ([place])   . GERD (gastroesophageal reflux disease)   . Glaucoma   . Hyperlipidemia   . Hypertension   .  Legally blind in right eye, as defined in [place]   . Neuromuscular disorder ([place])   . Neuropathy   . Stroke [facility])       Past Surgical History:  Procedure Laterality Date  . CATARACT EXTRACTION Left   . CYSTOSCOPY WITH RETROGRADE PYELOGRAM, AND LEFT STENT PLACEMENT Left 10/06/2015   Performed by Cleon Gustin, MD at [facility] ORS      Social History:      Social History   Tobacco Use  . Smoking status: Current Every Day Smoker    Packs/day: 1.00    Years: 55.00    Pack years: 55.00    Types: Cigarettes  . Smokeless tobacco: Never Used  Substance Use Topics  . Alcohol use: No       Family History :     Family History  Problem Relation Age of Onset  . Cancer Mother   . Alcoholism Father   . Cancer Father   . Cancer Other   .  Cancer Sister   . Diabetes Brother   . Stroke Brother       Home Medications:   Prior to Admission medications   Medication Sig Start Date End Date Taking? Authorizing Provider  amLODipine (NORVASC) 5 MG tablet Take 1 tablet (5 mg total) by mouth daily. 11/23/16   Kathie Dike, MD  atorvastatin (LIPITOR) 80 MG tablet Take 40 mg by mouth at bedtime.    [provider]  brimonidine (ALPHAGAN) 0.2 % ophthalmic solution Place 1 drop into both eyes 2 (two) times daily.    [provider]  carboxymethylcellulose (REFRESH PLUS) 0.5 % SOLN Apply 1 drop to eye 4 (four) times daily.    [provider]  dorzolamide-timolol (COSOPT) 22.3-6.8 MG/ML ophthalmic solution 1 drop 2 (two) times daily.    [provider]  furosemide (LASIX) 40 MG tablet Take 40 mg by mouth every morning.    [provider]  Insulin Glargine (LANTUS SOLOSTAR) 100 UNIT/ML Solostar Pen Inject 29 Units daily into the skin.     [provider]  omeprazole (PRILOSEC) 20 MG capsule Take 20 mg by mouth daily.    [provider]  simethicone (MYLICON) 80 MG chewable tablet Chew 160 mg by mouth 2 (two) times daily as needed for  flatulence.    [provider]  tamsulosin (FLOMAX) 0.4 MG CAPS capsule Take 1 capsule (0.4 mg total) by mouth daily. 11/07/15   Rancour, Annie Main, MD     Allergies:    No Known Allergies   Physical Exam:   Vitals  Blood pressure 135/74, pulse 66, temperature (!) 97.5 F (36.4 C), temperature source Oral, resp. rate 14, height 6' (1.829 m), weight 95.3 kg (210 lb), SpO2 98 %.  1.  General: Appears in no acute distress  2. Psychiatric:  Intact judgement and  insight, awake alert, oriented x 3.  3. Neurologic: No focal neurological deficits, all cranial nerves intact.Strength 4/5 in left upper extremity, slight facial droop on left side 4. Eyes :  anicteric sclerae, moist conjunctivae with no lid lag. PERRLA.  5. ENMT:  Oropharynx clear with moist mucous membranes and good dentition  6. Neck:  supple, no cervical lymphadenopathy appriciated, No thyromegaly  7. Respiratory : Normal respiratory effort, good air movement bilaterally,clear to  auscultation bilaterally  8. Cardiovascular : RRR, no gallops, rubs or murmurs, no leg edema  9. Gastrointestinal:  Positive bowel sounds, abdomen soft, non-tender to palpation,no hepatosplenomegaly, no rigidity or guarding       10. Skin:  No cyanosis, normal texture and turgor, no rash, lesions or ulcers  11.Musculoskeletal:  Good muscle tone,  joints appear normal , no effusions,  normal range of motion    Data Review:    CBC Recent Labs  Lab 06/22/17 1836 06/22/17 1840  WBC 7.1  --   HGB 12.7* 13.6  HCT 38.6* 40.0  PLT 178  --   MCV 87.1  --   MCH 28.7  --   MCHC 32.9  --   RDW 13.4  --   LYMPHSABS 1.2  --   MONOABS 0.5  --   EOSABS 0.3  --   BASOSABS 0.0  --    ------------------------------------------------------------------------------------------------------------------  Chemistries  Recent Labs  Lab 06/22/17 1836 06/22/17 1840  NA 140 140  K 4.3 4.6  CL 108 106  CO2 25  --   GLUCOSE 140*  137*  BUN 44* 44*  CREATININE 2.59* 2.70*  CALCIUM 9.5  --  AST 18  --   ALT 23  --   ALKPHOS 101  --   BILITOT 0.6  --    ------------------------------------------------------------------------------------------------------------------  ------------------------------------------------------------------------------------------------------------------ GFR: Estimated Creatinine Clearance: 32.7 mL/min (A) (by C-G formula based on SCr of 2.7 mg/dL (H)). Liver Function Tests: Recent Labs  Lab 06/22/17 1836  AST 18  ALT 23  ALKPHOS 101  BILITOT 0.6  PROT 7.2  ALBUMIN 3.8   No results for input(s): LIPASE, AMYLASE in the last 168 hours. No results for input(s): AMMONIA in the last 168 hours. Coagulation Profile: Recent Labs  Lab 06/22/17 1836  INR 0.91    --------------------------------------------------------------------------------------------------------------- Urine analysis:    Component Value Date/Time   COLORURINE STRAW (A) 06/12/2017 1128   APPEARANCEUR CLEAR 06/12/2017 1128   LABSPEC 1.010 06/12/2017 1128   PHURINE 5.5 06/12/2017 1128   GLUCOSEU 100 (A) 06/12/2017 1128   HGBUR NEGATIVE 06/12/2017 1128   BILIRUBINUR NEGATIVE 06/12/2017 1128   KETONESUR NEGATIVE 06/12/2017 1128   PROTEINUR TRACE (A) 06/12/2017 1128   UROBILINOGEN 0.2 05/12/2013 2357   NITRITE NEGATIVE 06/12/2017 1128   LEUKOCYTESUR NEGATIVE 06/12/2017 1128      Imaging Results:    Dg Chest 1 View  Result Date: 06/22/2017 CLINICAL DATA:  Staggering gait and loss of balance EXAM: CHEST 1 VIEW COMPARISON:  11/21/2016 FINDINGS: The heart size and mediastinal contours are within normal limits. Both lungs are clear. The visualized skeletal structures are unremarkable. IMPRESSION: No active disease. Electronically Signed   By: Inez Catalina M.D.   On: 06/22/2017 18:55   Ct Head Wo Contrast  Result Date: 06/22/2017 CLINICAL DATA:  Slurred speech and staggering gait EXAM: CT HEAD WITHOUT  CONTRAST TECHNIQUE: Contiguous axial images were obtained from the base of the skull through the vertex without intravenous contrast. COMPARISON:  06/22/2017 FINDINGS: Brain: Mild atrophic changes and chronic white matter ischemic changes are again seen and stable. No findings to suggest acute hemorrhage, acute infarction or space-occupying mass lesion are seen. Vascular: No hyperdense vessel or unexpected calcification. Skull: Normal. Negative for fracture or focal lesion. Sinuses/Orbits: No acute finding. Other: None. IMPRESSION: Chronic atrophic and ischemic changes without acute abnormality. Electronically Signed   By: Inez Catalina M.D.   On: 06/22/2017 18:51    My personal review of EKG: Rhythm NSR   Assessment & Plan:    Active Problems:   Left-sided weakness   1. Left-sided weakness-patient will be transferred to [facility] for [facility] brain.  If MRI is positive for stroke consider stroke workup.  2. History of stroke-patient takes Plavix at home.  Continue Plavix 75 mg p.o. daily 3. Diabetes mellitus-continue Lantus, initiate sliding scale insulin with NovoLog 4. Hyperlipidemia-continue Lipitor 5. Hypertension-hold amlodipine until MRI brain is done to rule out stroke   DVT Prophylaxis-   Lovenox  AM Labs Ordered, also please review Full Orders  Family Communication: Admission, patients condition and plan of care including tests being ordered have been discussed with the patient and his wife at bedside* who indicate understanding and agree with the plan and Code Status.  Code Status: Full code  Admission status: Observation  Time spent in minutes : 60 minutes  Oswald Hillock M.D on 06/22/2017 at 8:37 PM  Between 7am to 7pm - Pager - [phone]. After 7pm go to [email or web address] - password [facility]  [facility] - Office  [phone]

## 2017-06-22 NOTE — ED Notes (Signed)
[facility] tranported patient to [facility] at 2300 hrs.

## 2017-06-23 ENCOUNTER — Inpatient Hospital Stay (HOSPITAL_COMMUNITY): Payer: Medicare Other

## 2017-06-23 ENCOUNTER — Observation Stay (HOSPITAL_COMMUNITY): Payer: Medicare Other

## 2017-06-23 DIAGNOSIS — F17210 Nicotine dependence, cigarettes, uncomplicated: Secondary | ICD-10-CM | POA: Diagnosis present

## 2017-06-23 DIAGNOSIS — E1140 Type 2 diabetes mellitus with diabetic neuropathy, unspecified: Secondary | ICD-10-CM | POA: Diagnosis present

## 2017-06-23 DIAGNOSIS — I10 Essential (primary) hypertension: Secondary | ICD-10-CM | POA: Diagnosis not present

## 2017-06-23 DIAGNOSIS — T383X5A Adverse effect of insulin and oral hypoglycemic [antidiabetic] drugs, initial encounter: Secondary | ICD-10-CM

## 2017-06-23 DIAGNOSIS — F329 Major depressive disorder, single episode, unspecified: Secondary | ICD-10-CM | POA: Diagnosis present

## 2017-06-23 DIAGNOSIS — R260 Ataxic gait: Secondary | ICD-10-CM | POA: Diagnosis present

## 2017-06-23 DIAGNOSIS — R262 Difficulty in walking, not elsewhere classified: Secondary | ICD-10-CM | POA: Diagnosis not present

## 2017-06-23 DIAGNOSIS — Z79899 Other long term (current) drug therapy: Secondary | ICD-10-CM | POA: Diagnosis not present

## 2017-06-23 DIAGNOSIS — N183 Chronic kidney disease, stage 3 (moderate): Secondary | ICD-10-CM | POA: Diagnosis present

## 2017-06-23 DIAGNOSIS — I639 Cerebral infarction, unspecified: Secondary | ICD-10-CM | POA: Diagnosis not present

## 2017-06-23 DIAGNOSIS — E785 Hyperlipidemia, unspecified: Secondary | ICD-10-CM | POA: Diagnosis present

## 2017-06-23 DIAGNOSIS — R531 Weakness: Secondary | ICD-10-CM | POA: Diagnosis not present

## 2017-06-23 DIAGNOSIS — I6330 Cerebral infarction due to thrombosis of unspecified cerebral artery: Secondary | ICD-10-CM | POA: Diagnosis not present

## 2017-06-23 DIAGNOSIS — Z960 Presence of urogenital implants: Secondary | ICD-10-CM | POA: Diagnosis present

## 2017-06-23 DIAGNOSIS — E1151 Type 2 diabetes mellitus with diabetic peripheral angiopathy without gangrene: Secondary | ICD-10-CM | POA: Diagnosis present

## 2017-06-23 DIAGNOSIS — E11649 Type 2 diabetes mellitus with hypoglycemia without coma: Secondary | ICD-10-CM | POA: Diagnosis present

## 2017-06-23 DIAGNOSIS — Z833 Family history of diabetes mellitus: Secondary | ICD-10-CM | POA: Diagnosis not present

## 2017-06-23 DIAGNOSIS — Z7983 Long term (current) use of bisphosphonates: Secondary | ICD-10-CM | POA: Diagnosis not present

## 2017-06-23 DIAGNOSIS — R29810 Facial weakness: Secondary | ICD-10-CM | POA: Diagnosis present

## 2017-06-23 DIAGNOSIS — E160 Drug-induced hypoglycemia without coma: Secondary | ICD-10-CM | POA: Diagnosis not present

## 2017-06-23 DIAGNOSIS — R471 Dysarthria and anarthria: Secondary | ICD-10-CM | POA: Diagnosis present

## 2017-06-23 DIAGNOSIS — H548 Legal blindness, as defined in USA: Secondary | ICD-10-CM | POA: Diagnosis present

## 2017-06-23 DIAGNOSIS — E1122 Type 2 diabetes mellitus with diabetic chronic kidney disease: Secondary | ICD-10-CM | POA: Diagnosis present

## 2017-06-23 DIAGNOSIS — I69354 Hemiplegia and hemiparesis following cerebral infarction affecting left non-dominant side: Secondary | ICD-10-CM | POA: Diagnosis not present

## 2017-06-23 DIAGNOSIS — R2990 Unspecified symptoms and signs involving the nervous system: Secondary | ICD-10-CM | POA: Diagnosis not present

## 2017-06-23 DIAGNOSIS — K219 Gastro-esophageal reflux disease without esophagitis: Secondary | ICD-10-CM | POA: Diagnosis present

## 2017-06-23 DIAGNOSIS — N400 Enlarged prostate without lower urinary tract symptoms: Secondary | ICD-10-CM | POA: Diagnosis present

## 2017-06-23 DIAGNOSIS — Z794 Long term (current) use of insulin: Secondary | ICD-10-CM | POA: Diagnosis not present

## 2017-06-23 DIAGNOSIS — I6381 Other cerebral infarction due to occlusion or stenosis of small artery: Secondary | ICD-10-CM | POA: Diagnosis present

## 2017-06-23 DIAGNOSIS — H409 Unspecified glaucoma: Secondary | ICD-10-CM | POA: Diagnosis present

## 2017-06-23 DIAGNOSIS — I129 Hypertensive chronic kidney disease with stage 1 through stage 4 chronic kidney disease, or unspecified chronic kidney disease: Secondary | ICD-10-CM | POA: Diagnosis present

## 2017-06-23 LAB — CBC
HCT: 36.9 % — ABNORMAL LOW (ref 39.0–52.0)
Hemoglobin: 12.1 g/dL — ABNORMAL LOW (ref 13.0–17.0)
MCH: 28.4 pg (ref 26.0–34.0)
MCHC: 32.8 g/dL (ref 30.0–36.0)
MCV: 86.6 fL (ref 78.0–100.0)
PLATELETS: 166 10*3/uL (ref 150–400)
RBC: 4.26 MIL/uL (ref 4.22–5.81)
RDW: 13.7 % (ref 11.5–15.5)
WBC: 6.1 10*3/uL (ref 4.0–10.5)

## 2017-06-23 LAB — COMPREHENSIVE METABOLIC PANEL
ALT: 19 U/L (ref 17–63)
ANION GAP: 6 (ref 5–15)
AST: 17 U/L (ref 15–41)
Albumin: 3.3 g/dL — ABNORMAL LOW (ref 3.5–5.0)
Alkaline Phosphatase: 95 U/L (ref 38–126)
BUN: 40 mg/dL — ABNORMAL HIGH (ref 6–20)
CALCIUM: 9.1 mg/dL (ref 8.9–10.3)
CHLORIDE: 108 mmol/L (ref 101–111)
CO2: 24 mmol/L (ref 22–32)
Creatinine, Ser: 2.32 mg/dL — ABNORMAL HIGH (ref 0.61–1.24)
GFR calc non Af Amer: 28 mL/min — ABNORMAL LOW (ref >60)
GFR, EST AFRICAN AMERICAN: 32 mL/min — AB (ref >60)
Glucose, Bld: 203 mg/dL — ABNORMAL HIGH (ref 65–99)
POTASSIUM: 4.2 mmol/L (ref 3.5–5.1)
SODIUM: 138 mmol/L (ref 135–145)
Total Bilirubin: 0.5 mg/dL (ref 0.3–1.2)
Total Protein: 6.5 g/dL (ref 6.5–8.1)

## 2017-06-23 LAB — GLUCOSE, CAPILLARY
GLUCOSE-CAPILLARY: 176 mg/dL — AB (ref 65–99)
GLUCOSE-CAPILLARY: 165 mg/dL — AB (ref 65–99)
GLUCOSE-CAPILLARY: 128 mg/dL — AB (ref 65–99)
GLUCOSE-CAPILLARY: 98 mg/dL (ref 65–99)

## 2017-06-23 LAB — URINALYSIS, ROUTINE W REFLEX MICROSCOPIC
Bacteria, UA: NONE SEEN
Bilirubin Urine: NEGATIVE
GLUCOSE, UA: NEGATIVE mg/dL
Ketones, ur: NEGATIVE mg/dL
Leukocytes, UA: NEGATIVE
Nitrite: NEGATIVE
PROTEIN: 30 mg/dL — AB
Specific Gravity, Urine: 1.013 (ref 1.005–1.030)
pH: 5 (ref 5.0–8.0)

## 2017-06-23 LAB — RAPID URINE DRUG SCREEN, HOSP PERFORMED
AMPHETAMINES: NOT DETECTED
BENZODIAZEPINES: NOT DETECTED
Barbiturates: NOT DETECTED
Cocaine: NOT DETECTED
Opiates: NOT DETECTED
Tetrahydrocannabinol: NOT DETECTED

## 2017-06-23 LAB — HEMOGLOBIN A1C
Hgb A1c MFr Bld: 9.4 % — ABNORMAL HIGH (ref 4.8–5.6)
MEAN PLASMA GLUCOSE: 223.08 mg/dL

## 2017-06-23 MED ORDER — INSULIN GLARGINE 100 UNIT/ML ~~LOC~~ SOLN
29.0000 [IU] | Freq: Every day | SUBCUTANEOUS | Status: DC
  Administered 2017-06-23: 29 [IU] via SUBCUTANEOUS
  Filled 2017-06-23 (×2): qty 0.29

## 2017-06-23 MED ORDER — PANTOPRAZOLE SODIUM 40 MG PO TBEC
40.0000 mg | DELAYED_RELEASE_TABLET | Freq: Every day | ORAL | Status: DC
  Administered 2017-06-23 – 2017-06-24 (×2): 40 mg via ORAL
  Filled 2017-06-23 (×2): qty 1

## 2017-06-23 MED ORDER — CLOPIDOGREL BISULFATE 75 MG PO TABS
75.0000 mg | ORAL_TABLET | Freq: Every day | ORAL | Status: DC
  Administered 2017-06-23 – 2017-06-24 (×2): 75 mg via ORAL
  Filled 2017-06-23 (×2): qty 1

## 2017-06-23 MED ORDER — BRIMONIDINE TARTRATE 0.2 % OP SOLN
1.0000 [drp] | Freq: Two times a day (BID) | OPHTHALMIC | Status: DC
  Administered 2017-06-23 – 2017-06-24 (×4): 1 [drp] via OPHTHALMIC
  Filled 2017-06-23: qty 5

## 2017-06-23 MED ORDER — ENOXAPARIN SODIUM 30 MG/0.3ML ~~LOC~~ SOLN
30.0000 mg | SUBCUTANEOUS | Status: DC
  Administered 2017-06-23 – 2017-06-24 (×2): 30 mg via SUBCUTANEOUS
  Filled 2017-06-23 (×2): qty 0.3

## 2017-06-23 MED ORDER — DORZOLAMIDE HCL-TIMOLOL MAL 2-0.5 % OP SOLN
1.0000 [drp] | Freq: Two times a day (BID) | OPHTHALMIC | Status: DC
  Administered 2017-06-23 – 2017-06-24 (×4): 1 [drp] via OPHTHALMIC
  Filled 2017-06-23: qty 10

## 2017-06-23 MED ORDER — ATORVASTATIN CALCIUM 40 MG PO TABS
40.0000 mg | ORAL_TABLET | Freq: Every day | ORAL | Status: DC
  Administered 2017-06-23 (×2): 40 mg via ORAL
  Filled 2017-06-23 (×2): qty 1

## 2017-06-23 MED ORDER — INSULIN ASPART 100 UNIT/ML ~~LOC~~ SOLN
0.0000 [IU] | Freq: Three times a day (TID) | SUBCUTANEOUS | Status: DC
  Administered 2017-06-23: 1 [IU] via SUBCUTANEOUS
  Administered 2017-06-23 (×2): 2 [IU] via SUBCUTANEOUS
  Administered 2017-06-24: 1 [IU] via SUBCUTANEOUS
  Administered 2017-06-24: 7 [IU] via SUBCUTANEOUS

## 2017-06-23 MED ORDER — SIMETHICONE 80 MG PO CHEW: 160.0000 mg | CHEWABLE_TABLET | Freq: Two times a day (BID) | ORAL | Status: DC | PRN

## 2017-06-23 MED ORDER — ONDANSETRON HCL 4 MG PO TABS: 4.0000 mg | ORAL_TABLET | Freq: Four times a day (QID) | ORAL | Status: DC | PRN

## 2017-06-23 MED ORDER — TAMSULOSIN HCL 0.4 MG PO CAPS
0.4000 mg | ORAL_CAPSULE | Freq: Every day | ORAL | Status: DC
  Administered 2017-06-23 – 2017-06-24 (×2): 0.4 mg via ORAL
  Filled 2017-06-23 (×2): qty 1

## 2017-06-23 MED ORDER — CARBOXYMETHYLCELLULOSE SODIUM 0.5 % OP SOLN: 1.0000 [drp] | Freq: Four times a day (QID) | OPHTHALMIC | Status: DC

## 2017-06-23 MED ORDER — ONDANSETRON HCL 4 MG/2ML IJ SOLN: 4.0000 mg | Freq: Four times a day (QID) | INTRAMUSCULAR | Status: DC | PRN

## 2017-06-23 MED ORDER — POLYVINYL ALCOHOL 1.4 % OP SOLN
1.0000 [drp] | Freq: Four times a day (QID) | OPHTHALMIC | Status: DC
  Administered 2017-06-23 – 2017-06-24 (×6): 1 [drp] via OPHTHALMIC
  Filled 2017-06-23: qty 15

## 2017-06-23 MED ORDER — SODIUM CHLORIDE 0.9 % IV SOLN
INTRAVENOUS | Status: DC
  Administered 2017-06-23 – 2017-06-24 (×2): via INTRAVENOUS

## 2017-06-23 MED ORDER — ASPIRIN EC 325 MG PO TBEC
325.0000 mg | DELAYED_RELEASE_TABLET | Freq: Every day | ORAL | Status: DC
  Administered 2017-06-23 – 2017-06-24 (×2): 325 mg via ORAL
  Filled 2017-06-23 (×2): qty 1

## 2017-06-23 NOTE — Progress Notes (Signed)
PROGRESS NOTE  Quintez Maselli Bourque IDP:[id number] DOB: May 06, 1951 DOA: 06/22/2017 PCP: Timmothy Euler, MD  HPI/Recap of past 24 hours: Mr Olveda is a 66 yo AAM with past medical hx significant for recurrent ischemic CVA x 2 who presented to ED [place] with complaints of left sided weakness. Was transferred to [facility] 06/22/17 for MRI brain which revealed acute ischemic CVA affecting R paramedian pons.  PMH also includes, HTN, HLD, tobacco use disorder, legally blindness affecting right eye, type 2 diabetes, CKD, and depression.  This morning, left sided weakness is persistent. States that symptoms started 2 days ago and did not think anything could have been done about it if he had presented sooner.   Assessment/Plan: Active Problems:   Left-sided weakness   Subacute ischemic CVA affecting paramedian pons - Hx of recurrent ischemic CVA x 2; last CVA 03/2017 - Neurology following - MRA brain, echo, frequent neurochecks, speech therapist for swallow eval - PT/OT evaluate and treat - Tobacco cessation counseling at bedside - lipitor 40 mg, asa 325 mg, plavix 75 mg  Hypertension -permissive HTN -hold amlodipine 5mg  daily  Type 2 diabetes -uncontrolled -A1C 9.4 -ISS with hypoglycemic protocol  HLD -lipid panel pending -lipitor 40 mg  Ambulatory dysfunction de to recurrent ischemic CVA -PT/OT evaluate and treat -Education officer, museum for possible SNF placement  GERD -po protonix  BPH -stable -tamsulosin  Tobacco use disorder - Tobacco cessation counseling at bedside  Code Status: Full  Family Communication: No family member present at bedside  Disposition Plan: Will spend another midnight to continue current management and to continue PT/OT. Possible  Placement to SNF for physical rehab   Consultants:  Post stroke neurology  Procedures:  None  Antimicrobials:  Not indicated  DVT prophylaxis:  lovenox sq 30 mg daily   Objective: Vitals:   06/23/17 0000 06/23/17 0200 06/23/17 0500 06/23/17 0615  BP: (!) 142/78 130/68 124/75 (!) 161/76  Pulse: 84 61 60 62  Resp: 18 18 16 18   Temp: 97.8 F (36.6 C) (!) 97.5 F (36.4 C) 97.9 F (36.6 C) 97.8 F (36.6 C)  TempSrc: Oral Oral Oral Oral  SpO2: 100% 99% 99% 99%  Weight:      Height:       No intake or output data in the 24 hours ending 06/23/17 0823 Filed Weights   06/22/17 1759  Weight: 95.3 kg (210 lb)    Exam:   General:  66 yo AAM WD WN NAD, mild slurring of speech. Blindness in right eye and poor visual acuity in left eye  Cardiovascular: RRR no rubs or gallops   Respiratory: CTA no wheeze or rhonchi  Abdomen: soft NT ND NBS x 4 quadrants  Musculoskeletal: 3/5 strength in left LE and 4/5 strength in left upper extremity  Skin: No noted acute findings  Psychiatry: Mood is appropriate for condition and setting   Data Reviewed: CBC: Recent Labs  Lab 06/22/17 1836 06/22/17 1840 06/23/17 0445  WBC 7.1  --  6.1  NEUTROABS 5.1  --   --   HGB 12.7* 13.6 12.1*  HCT 38.6* 40.0 36.9*  MCV 87.1  --  86.6  PLT 178  --  443   Basic Metabolic Panel: Recent Labs  Lab 06/22/17 1836 06/22/17 1840 06/23/17 0445  NA 140 140 138  K 4.3 4.6 4.2  CL 108 106 108  CO2 25  --  24  GLUCOSE 140* 137* 203*  BUN 44* 44* 40*  CREATININE 2.59*  2.70* 2.32*  CALCIUM 9.5  --  9.1   GFR: Estimated Creatinine Clearance: 38 mL/min (A) (by C-G formula based on SCr of 2.32 mg/dL (H)). Liver Function Tests: Recent Labs  Lab 06/22/17 1836 06/23/17 0445  AST 18 17  ALT 23 19  ALKPHOS 101 95  BILITOT 0.6 0.5  PROT 7.2 6.5  ALBUMIN 3.8 3.3*   No results for input(s): LIPASE, AMYLASE in the last 168 hours. No results for input(s): AMMONIA in the last 168 hours. Coagulation Profile: Recent Labs  Lab 06/22/17 1836  INR 0.91   Cardiac Enzymes: No results for input(s): CKTOTAL, CKMB, CKMBINDEX, TROPONINI in the last 168 hours. BNP (last 3 results) No results for  input(s): PROBNP in the last 8760 hours. HbA1C: Recent Labs    06/23/17 0529  HGBA1C 9.4*   CBG: Recent Labs  Lab 06/23/17 0638  GLUCAP 176*   Lipid Profile: No results for input(s): CHOL, HDL, LDLCALC, TRIG, CHOLHDL, LDLDIRECT in the last 72 hours. Thyroid Function Tests: No results for input(s): TSH, T4TOTAL, FREET4, T3FREE, THYROIDAB in the last 72 hours. Anemia Panel: No results for input(s): VITAMINB12, FOLATE, FERRITIN, TIBC, IRON, RETICCTPCT in the last 72 hours. Urine analysis:    Component Value Date/Time   COLORURINE YELLOW 06/23/2017 0654   APPEARANCEUR CLEAR 06/23/2017 0654   LABSPEC 1.013 06/23/2017 0654   PHURINE 5.0 06/23/2017 0654   GLUCOSEU NEGATIVE 06/23/2017 0654   HGBUR SMALL (A) 06/23/2017 0654   BILIRUBINUR NEGATIVE 06/23/2017 0654   KETONESUR NEGATIVE 06/23/2017 0654   PROTEINUR 30 (A) 06/23/2017 0654   UROBILINOGEN 0.2 05/12/2013 2357   NITRITE NEGATIVE 06/23/2017 0654   LEUKOCYTESUR NEGATIVE 06/23/2017 0654   Sepsis Labs: @LABRCNTIP (procalcitonin:4,lacticidven:4)  )No results found for this or any previous visit (from the past 240 hour(s)).    Studies: Dg Chest 1 View  Result Date: 06/22/2017 CLINICAL DATA:  Staggering gait and loss of balance EXAM: CHEST 1 VIEW COMPARISON:  11/21/2016 FINDINGS: The heart size and mediastinal contours are within normal limits. Both lungs are clear. The visualized skeletal structures are unremarkable. IMPRESSION: No active disease. Electronically Signed   By: Inez Catalina M.D.   On: 06/22/2017 18:55   Ct Head Wo Contrast  Result Date: 06/22/2017 CLINICAL DATA:  Slurred speech and staggering gait EXAM: CT HEAD WITHOUT CONTRAST TECHNIQUE: Contiguous axial images were obtained from the base of the skull through the vertex without intravenous contrast. COMPARISON:  06/22/2017 FINDINGS: Brain: Mild atrophic changes and chronic white matter ischemic changes are again seen and stable. No findings to suggest acute  hemorrhage, acute infarction or space-occupying mass lesion are seen. Vascular: No hyperdense vessel or unexpected calcification. Skull: Normal. Negative for fracture or focal lesion. Sinuses/Orbits: No acute finding. Other: None. IMPRESSION: Chronic atrophic and ischemic changes without acute abnormality. Electronically Signed   By: Inez Catalina M.D.   On: 06/22/2017 18:51   Mr Brain Wo Contrast  Result Date: 06/23/2017 CLINICAL DATA:  Initial evaluation for acute imbalance, gait difficulty. EXAM: MRI HEAD WITHOUT CONTRAST TECHNIQUE: Multiplanar, multiecho pulse sequences of the brain and surrounding structures were obtained without intravenous contrast. COMPARISON:  Prior CT from 06/22/2017. FINDINGS: Brain: Diffuse prominence of the CSF containing spaces compatible with generalized cerebral atrophy. Patchy and confluent T2/FLAIR hyperintensity within the periventricular and deep white matter both cerebral hemispheres, most consistent with chronic micro vessel ischemic disease, fairly advanced in nature. Multiple remote lacunar infarcts present within the bilateral thalami and and pons. There is a 9 mm acute ischemic  lacunar infarct within the right paramedian pons (series 4, image 19). No associated hemorrhage or mass effect. No other evidence for acute or subacute infarction. Gray-white matter differentiation maintained. No acute intracranial hemorrhage. Subcentimeter focus of susceptibility artifact within the medial left upper lobe compatible with a small chronic microhemorrhage. No mass lesion, midline shift or mass effect. No hydrocephalus. No extra-axial fluid collection. Major dural sinuses are grossly clear. Pituitary gland suprasellar region normal. Midline structures intact and normal. Vascular: Major intravascular flow voids are maintained. Skull and upper cervical spine: Craniocervical junction within normal limits. Upper cervical spine normal. Bone marrow signal intensity within normal limits.  No acute scalp soft tissue abnormality. Small focus of diffusion abnormality within the left parietal scalp noted, of doubtful significance. Sinuses/Orbits: Patient status post lens extraction on the left. Asymmetric FLAIR signal intensity noted within the right globe, stable from previous. Patient reportedly blind in the right eye. Chronic mucosal thickening involving the frontoethmoidal sinuses with superimposed mucous retention cysts within the left frontal sinus. Mastoids are largely clear. Inner ear structures normal. Other: None. IMPRESSION: 1. 9 mm acute ischemic nonhemorrhagic infarct involving the right paramedian pons. 2. Advanced chronic microvascular ischemic disease with multiple remote lacunar infarcts as above. Electronically Signed   By: Jeannine Boga M.D.   On: 06/23/2017 04:21    Scheduled Meds: . atorvastatin  40 mg Oral QHS  . brimonidine  1 drop Both Eyes BID  . clopidogrel  75 mg Oral Daily  . dorzolamide-timolol  1 drop Both Eyes BID  . enoxaparin (LOVENOX) injection  30 mg Subcutaneous Q24H  . insulin aspart  0-9 Units Subcutaneous TID WC  . insulin glargine  29 Units Subcutaneous Daily  . pantoprazole  40 mg Oral Daily  . polyvinyl alcohol  1 drop Right Eye QID  . tamsulosin  0.4 mg Oral Daily    Continuous Infusions: . sodium chloride 75 mL/hr at 06/23/17 0040     LOS: 0 days     Kayleen Memos, MD [facility] Pager [phone]  If 7PM-7AM, please contact night-coverage [email or web address] Password [facility] 06/23/2017, 8:23 AM

## 2017-06-23 NOTE — Progress Notes (Signed)
Carotid duplex prelim: 1-39% ICA stenosis.  Tawana Pasch Eunice, RDMS, RVT   

## 2017-06-23 NOTE — Consult Note (Signed)
Stroke consult note  Referring Physician: Dr Nevada Crane    Chief Complaint: Weakness and speech difficulties  HPI: Trevor Crawford is a 66 y.o. male followed at the [facility] in [place] with a history of previous strokes (lacunar infarcts in the pons and bilateral thalami), neuropathy, ongoing tobacco use, legally blind right eye, hypertension, hyperlipidemia, diabetes mellitus, chronic kidney disease, and depression admitted to [facility] on Saturday 06/22/2017 for further evaluation of bilateral lower extremity weakness, left upper extremity weakness, and speech difficulties that he noted when he awoke on Thursday or Friday morning (he couldn't remember which day). He had residual bilateral lower extremity weakness and left upper extremity weakness from his previous strokes; however, the dysarthria was new. He ambulates with a walker and apparently he did not recognize the new weakness immediately. He has been on Plavix 75 mg daily and denies missing any recent doses. He continues to smoke a pack of cigarettes per day. An MRI performed today revealed a 9 mm acute ischemic nonhemorrhagic infarct involving the right paramedian pons. We discussed smoking cessation and the importance of good diabetic control.  Date last known well: Unable to determine Time last known well: Unable to determine tPA Given: No - outside treatment window.  Past Medical History:  Diagnosis Date  . Arthritis   . Cataract    bilateral  . Chronic kidney disease   . Depression   . Diabetes mellitus without complication ([place])   . GERD (gastroesophageal reflux disease)   . Glaucoma   . Hyperlipidemia   . Hypertension   . Legally blind in right eye, as defined in [place]   . Neuromuscular disorder ([place])   . Neuropathy   . Stroke [facility])     Past Surgical History:  Procedure Laterality Date  . CATARACT EXTRACTION Left   . CYSTOSCOPY WITH RETROGRADE PYELOGRAM, AND LEFT STENT PLACEMENT Left 10/06/2015   Performed by  Cleon Gustin, MD at [facility] ORS    Family History  Problem Relation Age of Onset  . Cancer Mother   . Alcoholism Father   . Cancer Father   . Cancer Other   . Cancer Sister   . Diabetes Brother   . Stroke Brother    Social History:  reports that he has been smoking cigarettes.  He has a 55.00 pack-year smoking history. he has never used smokeless tobacco. He reports that he does not drink alcohol or use drugs.  Allergies: No Known Allergies  Medications:  Scheduled: . aspirin EC  325 mg Oral Daily  . atorvastatin  40 mg Oral QHS  . brimonidine  1 drop Both Eyes BID  . clopidogrel  75 mg Oral Daily  . dorzolamide-timolol  1 drop Both Eyes BID  . enoxaparin (LOVENOX) injection  30 mg Subcutaneous Q24H  . insulin aspart  0-9 Units Subcutaneous TID WC  . insulin glargine  29 Units Subcutaneous Daily  . pantoprazole  40 mg Oral Daily  . polyvinyl alcohol  1 drop Right Eye QID  . tamsulosin  0.4 mg Oral Daily    ROS: History obtained from the patient  General ROS: negative for - chills, fatigue, fever, night sweats, weight gain or weight loss.  Psychological ROS: negative for - behavioral disorder, hallucinations, memory difficulties, mood swings or suicidal ideation Ophthalmic ROS: The patient is blind in his right eye. He has limited vision in his left eye. He recently lost all of the vision in his left eye and was evaluated at the  [facility] in [place] for an apparent vitreous hemorrhage. His vision has since returned to baseline. ENT ROS: Positive for recent nasal and sinus congestion. Allergy and Immunology ROS: negative for - hives or itchy/watery eyes Hematological and Lymphatic ROS: negative for - bleeding problems, bruising or swollen lymph nodes Endocrine ROS: negative for - galactorrhea, hair pattern changes, polydipsia/polyuria or temperature intolerance Respiratory ROS: negative for - cough, hemoptysis, shortness of breath or wheezing Cardiovascular ROS: negative  for - chest pain, dyspnea on exertion, edema or irregular heartbeat Gastrointestinal ROS: negative for - abdominal pain, diarrhea, hematemesis, nausea/vomiting or stool incontinence Genito-Urinary ROS: negative for - dysuria, hematuria, incontinence or urinary frequency/urgency Musculoskeletal ROS: negative for - joint swelling or muscular weakness Neurological ROS: Positive for occasional recent mild to moderate headaches. Dermatological ROS: negative for rash and skin lesion changes   Physical Examination: Vitals:   06/23/17 0200 06/23/17 0500 06/23/17 0615 06/23/17 0955  BP: 130/68 124/75 (!) 161/76 127/66  Pulse: 61 60 62 65  Resp: 18 16 18 18   Temp: (!) 97.5 F (36.4 C) 97.9 F (36.6 C) 97.8 F (36.6 C) 98.8 F (37.1 C)  TempSrc: Oral Oral Oral Oral  SpO2: 99% 99% 99% 98%  Weight:      Height:        General - pleasant 66 year old male alert and oriented and in no acute distress. Heart - regular rate and rhythm  Lungs - clear breathing sound bilaterally Abdomen - Soft - non tender Extremities - Distal pulses weak to absent - no edema Skin - Warm and very dry  Neurologic Examination:  Mental Status:  Alert, oriented, thought content appropriate. Mild to moderate dysarthria. Able to follow 3 step commands without difficulty.  Cranial Nerves:  II- blind OD. Left eye has decreased visual acuity. He is only able to see what is directly in front of him. This is not new. III/IV/VI- left pupil pinpoint and sluggish. Extraocular movements were full.  V/VII-no facial numbness and mild left facial droop.  VIII-hearing normal.  X-Symmetrical palatal movement.  XII-midline tongue extension  Motor: 5/5 strength in RUE and RLE, but left UE pronator drift and left hand dexterity difficulty, and left LE distal 4/5.  Muscle tone normal throughout. Sensory: Intact to light touch in all extremities. Deep Tendon Reflexes: 2/4 throughout Plantars: Downgoing bilaterally  Cerebellar:  Normal finger to nose and heel to shin bilaterally. Gait: deferred  Laboratory Studies:  Basic Metabolic Panel: Recent Labs  Lab 06/22/17 1836 06/22/17 1840 06/23/17 0445  NA 140 140 138  K 4.3 4.6 4.2  CL 108 106 108  CO2 25  --  24  GLUCOSE 140* 137* 203*  BUN 44* 44* 40*  CREATININE 2.59* 2.70* 2.32*  CALCIUM 9.5  --  9.1    Liver Function Tests: Recent Labs  Lab 06/22/17 1836 06/23/17 0445  AST 18 17  ALT 23 19  ALKPHOS 101 95  BILITOT 0.6 0.5  PROT 7.2 6.5  ALBUMIN 3.8 3.3*   No results for input(s): LIPASE, AMYLASE in the last 168 hours. No results for input(s): AMMONIA in the last 168 hours.  CBC: Recent Labs  Lab 06/22/17 1836 06/22/17 1840 06/23/17 0445  WBC 7.1  --  6.1  NEUTROABS 5.1  --   --   HGB 12.7* 13.6 12.1*  HCT 38.6* 40.0 36.9*  MCV 87.1  --  86.6  PLT 178  --  166    Cardiac Enzymes: No results for input(s): CKTOTAL, CKMB, CKMBINDEX, TROPONINI  in the last 168 hours.  BNP: Invalid input(s): POCBNP  CBG: Recent Labs  Lab 06/23/17 0638  GLUCAP 176*    Microbiology: Results for orders placed or performed during the hospital encounter of 11/07/15  Urine culture     Status: None   Collection Time: 11/07/15  9:20 AM  Result Value Ref Range Status   Specimen Description URINE, CLEAN CATCH  Final   Special Requests NONE  Final   Culture   Final    NO GROWTH 1 DAY Performed at [facility]    Report Status 11/08/2015 FINAL  Final  Blood culture (routine x 2)     Status: None   Collection Time: 11/07/15  9:40 AM  Result Value Ref Range Status   Specimen Description BLOOD RIGHT ANTECUBITAL  Final   Special Requests BOTTLES DRAWN AEROBIC AND ANAEROBIC 4CC EACH  Final   Culture NO GROWTH 5 DAYS  Final   Report Status 11/12/2015 FINAL  Final  Blood culture (routine x 2)     Status: None   Collection Time: 11/07/15  9:45 AM  Result Value Ref Range Status   Specimen Description BLOOD LEFT ARM  Final   Special Requests    Final    BOTTLES DRAWN AEROBIC AND ANAEROBIC AEB=8CC ANA=6CC   Culture NO GROWTH 5 DAYS  Final   Report Status 11/12/2015 FINAL  Final    Coagulation Studies: Recent Labs    06/22/17 1836  LABPROT 12.2  INR 0.91    Urinalysis:  Recent Labs  Lab 06/23/17 0654  COLORURINE YELLOW  LABSPEC 1.013  PHURINE 5.0  GLUCOSEU NEGATIVE  HGBUR SMALL*  BILIRUBINUR NEGATIVE  KETONESUR NEGATIVE  PROTEINUR 30*  NITRITE NEGATIVE  LEUKOCYTESUR NEGATIVE    Lipid Panel:    Component Value Date/Time   CHOL 121 11/22/2016 0444   TRIG 120 11/22/2016 0444   HDL 31 (L) 11/22/2016 0444   CHOLHDL 3.9 11/22/2016 0444   VLDL 24 11/22/2016 0444   LDLCALC 66 11/22/2016 0444    HgbA1C:  Lab Results  Component Value Date   HGBA1C 9.4 (H) 06/23/2017    Urine Drug Screen:      Component Value Date/Time   LABOPIA NONE DETECTED 06/23/2017 0655   COCAINSCRNUR NONE DETECTED 06/23/2017 0655   LABBENZ NONE DETECTED 06/23/2017 0655   AMPHETMU NONE DETECTED 06/23/2017 0655   THCU NONE DETECTED 06/23/2017 0655   LABBARB NONE DETECTED 06/23/2017 0655    Alcohol Level:  Recent Labs  Lab 06/22/17 1836  ETH <10    Other results: EKG: Sinus rhythm rate 68 bpm with occasional PACs. Please refer to the cardiology interpretation for complete details.  IMAGING I have personally reviewed the radiological images below and agree with the radiology interpretations.  Dg Chest 1 View 06/22/2017 IMPRESSION:  No active disease.   Ct Head Wo Contrast 06/22/2017 IMPRESSION:  Chronic atrophic and ischemic changes without acute abnormality.  Mr Brain Wo Contrast 06/23/2017 IMPRESSION:  1. 9 mm acute ischemic nonhemorrhagic infarct involving the right paramedian pons.  2. Advanced chronic microvascular ischemic disease with multiple remote lacunar infarcts present within the  bilateral thalami and and pons.   MRA head pending  CUS 1-39% ICA stenosis.  2D echo pending  Assessment: 66 y.o. male  with multiple risk factors for stroke including previous lacunar infarcts now admitted with a new right paramedian pons infarct but minimal new deficits. Infarcts likely secondary to small vessel disease. Continue to stress the importance of risk factor modification  including smoking cessation and diabetic control. He is followed at the [facility] in [place] although he has also seen Dr. Merlene Laughter in [place] where the patient resides.  Stroke Risk Factors - previous strokes, diabetes mellitus, hyperlipidemia, hypertension, tobacco use, and family history of strokes.  Plan: 1. HgbA1c = 9.4, fasting lipid panel - pending 2. MRA  of the brain - pending 3. PT consult, OT consult, Speech consult 4. Echocardiogram - pending 5. Stroke risk factor modifications 6. Started DAPT as he failed plavix only PTA. Currently on ASA 325 mg daily  Plavix 75 mg daily PTA. 7. Continue lipitor 40 8. Telemetry monitoring 9. Frequent neuro checks 10. Quit smoking  Lowry Ram [facility] Pager [phone] 06/23/2017, 11:42 AM  I reviewed above note and agree with the assessment and plan. I have made any additions or clarifications directly to the above note. Pt was seen and examined. He admitted that he did not eat well for his diabetic diet that was why his glucose not in control. [facility] not [facility] to [facility]. He still has mild left sided weakness from previous strokes but dysarthria is new this time. This is his 3rd stroke for the last 6 months. He still smokes. UDS neg and A1C 9.4. He needs better risk factor modification. Currently MRA and TTE pending. Agree with DAPT and lipitor for stroke prevention. Continue cardiac monitoring and neuro check, pending PT/OT/speech. Will follow.   Rosalin Hawking, MD PhD Stroke Neurology 06/23/2017 2:28 PM

## 2017-06-24 ENCOUNTER — Other Ambulatory Visit (HOSPITAL_COMMUNITY): Payer: Non-veteran care

## 2017-06-24 ENCOUNTER — Inpatient Hospital Stay (HOSPITAL_COMMUNITY): Payer: Medicare Other

## 2017-06-24 DIAGNOSIS — F17200 Nicotine dependence, unspecified, uncomplicated: Secondary | ICD-10-CM

## 2017-06-24 DIAGNOSIS — I10 Essential (primary) hypertension: Secondary | ICD-10-CM

## 2017-06-24 DIAGNOSIS — I639 Cerebral infarction, unspecified: Secondary | ICD-10-CM

## 2017-06-24 DIAGNOSIS — E160 Drug-induced hypoglycemia without coma: Secondary | ICD-10-CM

## 2017-06-24 DIAGNOSIS — Z8673 Personal history of transient ischemic attack (TIA), and cerebral infarction without residual deficits: Secondary | ICD-10-CM

## 2017-06-24 LAB — GLUCOSE, CAPILLARY
GLUCOSE-CAPILLARY: 52 mg/dL — AB (ref 65–99)
Glucose-Capillary: 51 mg/dL — ABNORMAL LOW (ref 65–99)
Glucose-Capillary: 149 mg/dL — ABNORMAL HIGH (ref 65–99)
Glucose-Capillary: 320 mg/dL — ABNORMAL HIGH (ref 65–99)
Glucose-Capillary: 139 mg/dL — ABNORMAL HIGH (ref 65–99)

## 2017-06-24 LAB — ECHOCARDIOGRAM COMPLETE
HEIGHTINCHES: 72 in
WEIGHTICAEL: 3360 [oz_av]

## 2017-06-24 LAB — CBC
HEMATOCRIT: 37.5 % — AB (ref 39.0–52.0)
HEMOGLOBIN: 12.4 g/dL — AB (ref 13.0–17.0)
MCH: 28.6 pg (ref 26.0–34.0)
MCHC: 33.1 g/dL (ref 30.0–36.0)
MCV: 86.4 fL (ref 78.0–100.0)
Platelets: 170 10*3/uL (ref 150–400)
RBC: 4.34 MIL/uL (ref 4.22–5.81)
RDW: 13.3 % (ref 11.5–15.5)
WBC: 7.3 10*3/uL (ref 4.0–10.5)

## 2017-06-24 LAB — BASIC METABOLIC PANEL
Anion gap: 8 (ref 5–15)
BUN: 30 mg/dL — AB (ref 6–20)
CHLORIDE: 109 mmol/L (ref 101–111)
CO2: 20 mmol/L — ABNORMAL LOW (ref 22–32)
Calcium: 9 mg/dL (ref 8.9–10.3)
Creatinine, Ser: 1.76 mg/dL — ABNORMAL HIGH (ref 0.61–1.24)
GFR calc Af Amer: 45 mL/min — ABNORMAL LOW (ref >60)
GFR calc non Af Amer: 39 mL/min — ABNORMAL LOW (ref >60)
Glucose, Bld: 62 mg/dL — ABNORMAL LOW (ref 65–99)
POTASSIUM: 3.9 mmol/L (ref 3.5–5.1)
SODIUM: 137 mmol/L (ref 135–145)

## 2017-06-24 LAB — LIPID PANEL
CHOLESTEROL: 121 mg/dL (ref 0–200)
HDL: 32 mg/dL — ABNORMAL LOW (ref >40)
LDL Cholesterol: 73 mg/dL (ref 0–99)
Total CHOL/HDL Ratio: 3.8 RATIO
Triglycerides: 78 mg/dL (ref <150)
VLDL: 16 mg/dL (ref 0–40)

## 2017-06-24 MED ORDER — ATORVASTATIN CALCIUM 80 MG PO TABS: 80.0000 mg | ORAL_TABLET | Freq: Every day | ORAL | 0 refills | Status: DC

## 2017-06-24 MED ORDER — INSULIN GLARGINE 100 UNIT/ML ~~LOC~~ SOLN: 20.0000 [IU] | Freq: Every day | SUBCUTANEOUS | Status: DC

## 2017-06-24 MED ORDER — CLOPIDOGREL BISULFATE 75 MG PO TABS: 75.0000 mg | ORAL_TABLET | Freq: Every day | ORAL | 0 refills | Status: DC

## 2017-06-24 MED ORDER — ATORVASTATIN CALCIUM 80 MG PO TABS: 80.0000 mg | ORAL_TABLET | Freq: Every day | ORAL | Status: DC

## 2017-06-24 MED ORDER — INSULIN GLARGINE 100 UNIT/ML SOLOSTAR PEN: 20.0000 [IU] | PEN_INJECTOR | Freq: Every day | SUBCUTANEOUS | 11 refills | Status: DC

## 2017-06-24 MED ORDER — ASPIRIN 325 MG PO TBEC: 325.0000 mg | DELAYED_RELEASE_TABLET | Freq: Every day | ORAL | 0 refills | Status: AC

## 2017-06-24 NOTE — Progress Notes (Signed)
  Echocardiogram 2D Echocardiogram has been performed.  Tresa Res 06/24/2017, 2:43 PM

## 2017-06-24 NOTE — Discharge Summary (Signed)
Discharge Summary  Trevor Crawford BWL:[id number] DOB: 1951/04/21  PCP: Timmothy Euler, MD  Admit date: 06/22/2017 Discharge date: 06/24/2017  Time spent: 25 minutes  Recommendations for Outpatient Follow-up:  1. Follow up with neurology within a week 2. Follow up with PCP within a week 3. Continue PT with home health 4. Follow up with Dr. Merlene Laughter in [place] 5. Follow up at the [facility] hospital in [place] 6. Take your medications as prescribed  Discharge Diagnoses:  Active Hospital Problems   Diagnosis Date Noted  . Cerebral thrombosis with cerebral infarction 06/23/2017  . Left-sided weakness 06/22/2017  . Acute CVA (cerebrovascular accident) ([place]) 11/21/2016    Resolved Hospital Problems  No resolved problems to display.    Discharge Condition: Stable  Diet recommendation: Resume previous diet  Vitals:   06/24/17 0932 06/24/17 1423  BP: (!) 179/65 124/62  Pulse: 61 (!) 58  Resp: 20 (!) 116  Temp: 97.9 F (36.6 C) 98.1 F (36.7 C)  SpO2: 97% 100%    History of present illness:  [name] is a 66 yo AAM with past medical hx significant for recurrent ischemic CVA x 2 who presented to ED [place] with complaints of left sided weakness. Was transferred to [facility] 06/22/17 for MRI brain which revealed acute ischemic CVA affecting R paramedian pons. Patient reports symptoms started 2 days ago and did not think anything could have been done about it if he had presented sooner.  PMH also includes, HTN, HLD, tobacco use disorder, legally blindness affecting right eye, type 2 diabetes, CKD, and depression.   Tobacco cessation done at bedside. The patient declined pharmaceutical help for tobacco cessation.  Neurology followed in the hospital added double antiplatelet since was only on plavix before.  No other acute events. Worked with PT and tolerated well with recommendations for home health with PT.   Hospital Course:  Active Problems:   Acute CVA  (cerebrovascular accident) ([place])   Left-sided weakness   Cerebral thrombosis with cerebral infarction  Subacute ischemic CVA affecting paramedian pons - Hx of recurrent ischemic CVA x 2; last CVA 03/2017 - Neurology following - MRA brain, echo, frequent neurochecks, speech therapist for swallow eval - PT/OT evaluate and treat -PT recommends PT with Home Health - Tobacco cessation counseling at bedside. Pt declines help - lipitor 80 mg, asa 325 mg, plavix 75 mg  Hypertension -permissive HTN followed -resume amlodipine 5 mg daily  Type 2 diabetes -uncontrolled -A1C 9.4 -ISS with hypoglycemic protocol -stop glipizide due to hypoglycemia with BS 50-60's  Hypoglycemia most likely 2/2 poor oral intake -stop glipizide -lantus dose decreased to 20 U daily from 29 U daily -patient instructed to check his BS prior to insulin administration  HLD -lipitor 80 mg  Ambulatory dysfunction de to recurrent ischemic CVA -PT recommends home health PT  GERD -po protonix  BPH -stable -tamsulosin  Tobacco use disorder - Tobacco cessation counseling at bedside - patient declines help for tobacco cessation   Procedures: None   Consultations:  Neurology  Discharge Exam: BP 124/62 (BP Location: Left Arm)   Pulse (!) 58   Temp 98.1 F (36.7 C) (Oral)   Resp (!) 116   Ht 6' (1.829 m)   Wt 95.3 kg (210 lb)   SpO2 100%   BMI 28.48 kg/m   General: 66 yo AAM well developed well nourished sitting up in a chair in NAD. left sided weakness persistent. Cardiovascular: RRR with no rubs or gallops Respiratory: CTA with  no wheezes or rhonchi Neurology: Motor: 5/5 strengthinRUE and RLE, butleft UE pronator drift and left hand dexterity difficulty, and left LE distal 4/5.Muscle tone normal throughout. Sensory: Intact to light touch in all extremities.  Discharge Instructions You were cared for by a hospitalist during your hospital stay. If you have any questions about your  discharge medications or the care you received while you were in the hospital after you are discharged, you can call the unit and asked to speak with the hospitalist on call if the hospitalist that took care of you is not available. Once you are discharged, your primary care physician will handle any further medical issues. Please note that NO REFILLS for any discharge medications will be authorized once you are discharged, as it is imperative that you return to your primary care physician (or establish a relationship with a primary care physician if you do not have one) for your aftercare needs so that they can reassess your need for medications and monitor your lab values.   Allergies as of 06/24/2017   No Known Allergies     Medication List    STOP taking these medications   glipiZIDE 10 MG tablet Commonly known as:  GLUCOTROL     TAKE these medications   amLODipine 5 MG tablet Commonly known as:  NORVASC Take 1 tablet (5 mg total) by mouth daily.   aspirin 325 MG EC tablet Take 1 tablet (325 mg total) daily by mouth. Start taking on:  06/25/2017   atorvastatin 80 MG tablet Commonly known as:  LIPITOR Take 1 tablet (80 mg total) at bedtime by mouth. What changed:  how much to take   brimonidine 0.2 % ophthalmic solution Commonly known as:  ALPHAGAN Place 1 drop 2 (two) times daily into the right eye.   carboxymethylcellulose 0.5 % Soln Commonly known as:  REFRESH PLUS Place 1 drop 4 (four) times daily into the right eye.   cholecalciferol 1000 units tablet Commonly known as:  VITAMIN D Take 1,000 Units 2 (two) times daily by mouth.   clopidogrel 75 MG tablet Commonly known as:  PLAVIX Take 1 tablet (75 mg total) daily by mouth. Start taking on:  06/25/2017   dorzolamide-timolol 22.3-6.8 MG/ML ophthalmic solution Commonly known as:  COSOPT Place 1 drop 2 (two) times daily into the right eye.   furosemide 40 MG tablet Commonly known as:  LASIX Take 40 mg 2 (two)  times daily by mouth.   Insulin Glargine 100 UNIT/ML Solostar Pen Commonly known as:  LANTUS SOLOSTAR Inject 20 Units daily into the skin. What changed:  how much to take   latanoprost 0.005 % ophthalmic solution Commonly known as:  XALATAN Place 1 drop at bedtime into the right eye.   ranitidine 150 MG tablet Commonly known as:  ZANTAC Take 150 mg every evening by mouth.   simethicone 80 MG chewable tablet Commonly known as:  MYLICON Chew 557 mg by mouth 2 (two) times daily as needed for flatulence.   tamsulosin 0.4 MG Caps capsule Commonly known as:  FLOMAX Take 1 capsule (0.4 mg total) by mouth daily.      No Known Allergies    The results of significant diagnostics from this hospitalization (including imaging, microbiology, ancillary and laboratory) are listed below for reference.    Significant Diagnostic Studies: Dg Chest 1 View  Result Date: 06/22/2017 CLINICAL DATA:  Staggering gait and loss of balance EXAM: CHEST 1 VIEW COMPARISON:  11/21/2016 FINDINGS: The heart size and mediastinal  contours are within normal limits. Both lungs are clear. The visualized skeletal structures are unremarkable. IMPRESSION: No active disease. Electronically Signed   By: Inez Catalina M.D.   On: 06/22/2017 18:55   Ct Head Wo Contrast  Result Date: 06/22/2017 CLINICAL DATA:  Slurred speech and staggering gait EXAM: CT HEAD WITHOUT CONTRAST TECHNIQUE: Contiguous axial images were obtained from the base of the skull through the vertex without intravenous contrast. COMPARISON:  06/22/2017 FINDINGS: Brain: Mild atrophic changes and chronic white matter ischemic changes are again seen and stable. No findings to suggest acute hemorrhage, acute infarction or space-occupying mass lesion are seen. Vascular: No hyperdense vessel or unexpected calcification. Skull: Normal. Negative for fracture or focal lesion. Sinuses/Orbits: No acute finding. Other: None. IMPRESSION: Chronic atrophic and ischemic  changes without acute abnormality. Electronically Signed   By: Inez Catalina M.D.   On: 06/22/2017 18:51   Ct Head Wo Contrast  Result Date: 06/12/2017 CLINICAL DATA:  66 year old male with acute left vision loss. EXAM: CT HEAD WITHOUT CONTRAST TECHNIQUE: Contiguous axial images were obtained from the base of the skull through the vertex without intravenous contrast. COMPARISON:  04/04/2017 CT, MR and prior studies FINDINGS: Brain: No evidence of acute infarction, hemorrhage, hydrocephalus, extra-axial collection or mass lesion/mass effect. Chronic small-vessel white matter ischemic changes and remote infarcts in the lines and bilateral bowel my again noted. Vascular: Intracranial atherosclerotic calcifications noted. Skull: Normal. Negative for fracture or focal lesion. Sinuses/Orbits: No acute finding. Other: None. IMPRESSION: 1. No evidence of acute intracranial abnormality 2. Chronic small-vessel white matter ischemic changes and remote infarcts as described. Electronically Signed   By: Margarette Canada M.D.   On: 06/12/2017 12:32   Mr Jodene Nam Head Wo Contrast  Result Date: 06/23/2017 CLINICAL DATA:  Stroke follow-up EXAM: MRA HEAD WITHOUT CONTRAST TECHNIQUE: Angiographic images of the Circle of Willis were obtained using MRA technique without intravenous contrast. COMPARISON:  Intracranial MRA 11/21/2016. Brain MRI from earlier today FINDINGS: Single visible left vertebral artery that is large. This appearance is stable from 11/21/2016. The left vertebral and basilar are smooth and diffusely patent. Visible left pica and right aica. Symmetric superior cerebellar arteries. Mild atheromatous narrowing at the right P1 segment. Symmetric carotid arteries that are smooth and diffusely patent. No branch occlusion or flow limiting stenosis is seen. Mildly hypoplastic left A1 segment. Negative for aneurysm. IMPRESSION: 1. No acute finding or change from 11/21/2016. No flow limiting stenosis. 2. Nonvisualized right  vertebral artery which is likely developmental. No occluded right V4 segment seen on conventional brain MRI. 3. Mild right P1 segment atheromatous type narrowing. Electronically Signed   By: Monte Fantasia M.D.   On: 06/23/2017 20:35   Mr Brain Wo Contrast  Result Date: 06/23/2017 CLINICAL DATA:  Initial evaluation for acute imbalance, gait difficulty. EXAM: MRI HEAD WITHOUT CONTRAST TECHNIQUE: Multiplanar, multiecho pulse sequences of the brain and surrounding structures were obtained without intravenous contrast. COMPARISON:  Prior CT from 06/22/2017. FINDINGS: Brain: Diffuse prominence of the CSF containing spaces compatible with generalized cerebral atrophy. Patchy and confluent T2/FLAIR hyperintensity within the periventricular and deep white matter both cerebral hemispheres, most consistent with chronic micro vessel ischemic disease, fairly advanced in nature. Multiple remote lacunar infarcts present within the bilateral thalami and and pons. There is a 9 mm acute ischemic lacunar infarct within the right paramedian pons (series 4, image 19). No associated hemorrhage or mass effect. No other evidence for acute or subacute infarction. Gray-white matter differentiation maintained. No acute intracranial hemorrhage.  Subcentimeter focus of susceptibility artifact within the medial left upper lobe compatible with a small chronic microhemorrhage. No mass lesion, midline shift or mass effect. No hydrocephalus. No extra-axial fluid collection. Major dural sinuses are grossly clear. Pituitary gland suprasellar region normal. Midline structures intact and normal. Vascular: Major intravascular flow voids are maintained. Skull and upper cervical spine: Craniocervical junction within normal limits. Upper cervical spine normal. Bone marrow signal intensity within normal limits. No acute scalp soft tissue abnormality. Small focus of diffusion abnormality within the left parietal scalp noted, of doubtful significance.  Sinuses/Orbits: Patient status post lens extraction on the left. Asymmetric FLAIR signal intensity noted within the right globe, stable from previous. Patient reportedly blind in the right eye. Chronic mucosal thickening involving the frontoethmoidal sinuses with superimposed mucous retention cysts within the left frontal sinus. Mastoids are largely clear. Inner ear structures normal. Other: None. IMPRESSION: 1. 9 mm acute ischemic nonhemorrhagic infarct involving the right paramedian pons. 2. Advanced chronic microvascular ischemic disease with multiple remote lacunar infarcts as above. Electronically Signed   By: Jeannine Boga M.D.   On: 06/23/2017 04:21    Microbiology: No results found for this or any previous visit (from the past 240 hour(s)).   Labs: Basic Metabolic Panel: Recent Labs  Lab 06/22/17 1836 06/22/17 1840 06/23/17 0445 06/24/17 0400  NA 140 140 138 137  K 4.3 4.6 4.2 3.9  CL 108 106 108 109  CO2 25  --  24 20*  GLUCOSE 140* 137* 203* 62*  BUN 44* 44* 40* 30*  CREATININE 2.59* 2.70* 2.32* 1.76*  CALCIUM 9.5  --  9.1 9.0   Liver Function Tests: Recent Labs  Lab 06/22/17 1836 06/23/17 0445  AST 18 17  ALT 23 19  ALKPHOS 101 95  BILITOT 0.6 0.5  PROT 7.2 6.5  ALBUMIN 3.8 3.3*   No results for input(s): LIPASE, AMYLASE in the last 168 hours. No results for input(s): AMMONIA in the last 168 hours. CBC: Recent Labs  Lab 06/22/17 1836 06/22/17 1840 06/23/17 0445 06/24/17 0400  WBC 7.1  --  6.1 7.3  NEUTROABS 5.1  --   --   --   HGB 12.7* 13.6 12.1* 12.4*  HCT 38.6* 40.0 36.9* 37.5*  MCV 87.1  --  86.6 86.4  PLT 178  --  166 170   Cardiac Enzymes: No results for input(s): CKTOTAL, CKMB, CKMBINDEX, TROPONINI in the last 168 hours. BNP: BNP (last 3 results) No results for input(s): BNP in the last 8760 hours.  ProBNP (last 3 results) No results for input(s): PROBNP in the last 8760 hours.  CBG: Recent Labs  Lab 06/23/17 2147 06/24/17 0651  06/24/17 0712 06/24/17 0753 06/24/17 1153  GLUCAP 98 52* 51* 149* 320*       Signed:  Kayleen Memos, MD [facility] 06/24/2017, 3:14 PM

## 2017-06-24 NOTE — Evaluation (Signed)
Physical Therapy Evaluation Patient Details Name: Trevor Crawford MRN: [id number] DOB: [birth year] Today's Date: 06/24/2017   History of Present Illness  Pt is a 66 y/o male with a PMH of previous strokes (lacunar infarcts in the pons and bilateral thalami), essential hypertension, DMII, legally blind R eye, decreased vision L eye. He presented with increased weakness and speech difficulties. MRI brain revealed a new R paramedian pons infarct.  Clinical Impression  Pt admitted with above diagnosis. Pt currently with functional limitations due to the deficits listed below (see PT Problem List). At the time of PT eval pt was able to perform transfers and ambulation with gross min guard assist for balance support and safety with RW. Pt reports he is typically using the [place] at home, but feel the RW is safer at this time. Pt will benefit from skilled PT to increase their independence and safety with mobility to allow discharge to the venue listed below.       Follow Up Recommendations Home health PT;Supervision for mobility/OOB    Equipment Recommendations  None recommended by PT    Recommendations for Other Services       Precautions / Restrictions Precautions Precautions: Fall Restrictions Weight Bearing Restrictions: No      Mobility  Bed Mobility Overal bed mobility: Modified Independent Bed Mobility: Supine to Sit           General bed mobility comments: Pt was able to transition to EOB without difficulty and without assistance. HOB flat with min use of rails to simulate home environment.   Transfers Overall transfer level: Needs assistance Equipment used: Rolling walker (2 wheeled) Transfers: Sit to/from Stand Sit to Stand: Min guard         General transfer comment: Hands on guarding to power-up to full standing position with RW. Pt sppears unsteady however no assistance required to gain/maintain standing balance.   Ambulation/Gait Ambulation/Gait assistance: Min  guard;Min assist Ambulation Distance (Feet): 200 Feet Assistive device: Rolling walker (2 wheeled) Gait Pattern/deviations: Step-through pattern;Decreased stride length;Trunk flexed;Drifts right/left Gait velocity: Decreased Gait velocity interpretation: Below normal speed for age/gender General Gait Details: Occasional assist required for balance support, however grossly at a min guard level with RW. Pt with drifting and mild staggering noted, but feel this is related to low vision as well.   Stairs            Wheelchair Mobility    Modified Rankin (Stroke Patients Only)       Balance                                             Pertinent Vitals/Pain Pain Assessment: No/denies pain    Home Living Family/patient expects to be discharged to:: Private residence Living Arrangements: Spouse/significant other Available Help at Discharge: Family;Available 24 hours/day Type of Home: Apartment Home Access: Level entry     Home Layout: One level Home Equipment: Clinical cytogeneticist - 2 wheels;Cane - single point      Prior Function Level of Independence: Independent with assistive device(s)         Comments: Cane for functional mobility in the community     Hand Dominance   Dominant Hand: Right    Extremity/Trunk Assessment   Upper Extremity Assessment Upper Extremity Assessment: Defer to OT evaluation    Lower Extremity Assessment Lower Extremity Assessment: Generalized weakness;LLE deficits/detail LLE Deficits /  Details: Decreased strength - grossly 4-/5 with MMT in quads, hamstrings, and hip flexors. Unsure how different this is compared to baseline.  LLE Sensation: (Denies N/T)    Cervical / Trunk Assessment Cervical / Trunk Assessment: Other exceptions Cervical / Trunk Exceptions: Forward head/rounded shoulder posture  Communication   Communication: No difficulties  Cognition Arousal/Alertness: Awake/alert Behavior During Therapy:  WFL for tasks assessed/performed Overall Cognitive Status: No family/caregiver present to determine baseline cognitive functioning                                 General Comments: A/O x4, however appeared confused at times during questioning.       General Comments      Exercises     Assessment/Plan    PT Assessment Patient needs continued PT services  PT Problem List Decreased strength;Decreased range of motion;Decreased activity tolerance;Decreased balance;Decreased mobility;Decreased coordination;Decreased cognition;Decreased knowledge of use of DME;Decreased knowledge of precautions;Decreased safety awareness       PT Treatment Interventions Gait training;DME instruction;Stair training;Functional mobility training;Therapeutic activities;Therapeutic exercise;Neuromuscular re-education;Patient/family education    PT Goals (Current goals can be found in the Care Plan section)  Acute Rehab PT Goals Patient Stated Goal: Get better; back to PLOF PT Goal Formulation: With patient Time For Goal Achievement: 07/08/17 Potential to Achieve Goals: Good    Frequency Min 4X/week   Barriers to discharge        Co-evaluation               AM-PAC PT "6 Clicks" Daily Activity  Outcome Measure Difficulty turning over in bed (including adjusting bedclothes, sheets and blankets)?: None Difficulty moving from lying on back to sitting on the side of the bed? : None Difficulty sitting down on and standing up from a chair with arms (e.g., wheelchair, bedside commode, etc,.)?: A Little Help needed moving to and from a bed to chair (including a wheelchair)?: A Little Help needed walking in hospital room?: A Little Help needed climbing 3-5 steps with a railing? : A Little 6 Click Score: 20    End of Session Equipment Utilized During Treatment: Gait belt Activity Tolerance: Patient tolerated treatment well Patient left: in chair;with call bell/phone within reach;with  chair alarm set Nurse Communication: Mobility status PT Visit Diagnosis: Unsteadiness on feet (R26.81);Other symptoms and signs involving the nervous system (R29.898)    Time: 6644-0347 PT Time Calculation (min) (ACUTE ONLY): 30 min   Charges:   PT Evaluation $PT Eval Moderate Complexity: 1 Mod PT Treatments $Gait Training: 8-22 mins   PT G Codes:        Rolinda Roan, PT, DPT Acute Rehabilitation Services Pager: [phone]   Thelma Comp 06/24/2017, 10:49 AM

## 2017-06-24 NOTE — Progress Notes (Signed)
Inpatient Diabetes Program Recommendations  AACE/ADA: New Consensus Statement on Inpatient Glycemic Control (2015)  Target Ranges:  Prepandial:   less than 140 mg/dL      Peak postprandial:   less than 180 mg/dL (1-2 hours)      Critically ill patients:  140 - 180 mg/dL  Results for Trevor Crawford, Trevor Crawford (MRN [id number]) as of 06/24/2017 10:13  Ref. Range 06/23/2017 06:38 06/23/2017 11:56 06/23/2017 16:22 06/23/2017 21:47 06/24/2017 06:51 06/24/2017 07:12 06/24/2017 07:53  Glucose-Capillary Latest Ref Range: 65 - 99 mg/dL 176 (H) 165 (H) 128 (H) 98 52 (L) 51 (L) 149 (H)   Results for Trevor Crawford, Trevor Crawford (MRN [id number]) as of 06/24/2017 10:13  Ref. Range 11/21/2016 14:34 06/23/2017 05:29  Hemoglobin A1C Latest Ref Range: 4.8 - 5.6 % 8.9 (H) 9.4 (H)    Review of Glycemic Control  Diabetes history: DM2 Outpatient Diabetes medications: Glipizide 20 mg BID, Lantus 29 units daily Current orders for Inpatient glycemic control: Lantus 29 units daily, Novolog 0-9 units TID with meals  Inpatient Diabetes Program Recommendations: Insulin - Basal: Noted fasting glucose 51 mg/dl this morning and patient received Lantus 29 units on 06/23/17.  Please consider decreasing Lantus to 25 units daily. Correction (SSI): Please consider ordering Novolog 0-5 units QHS for bedtime correction scale. HgbA1C: A1C 9.4% on 06/23/17 indicating an average glucose of 223 mg/dl over the past 2-3 months.   Thanks, Barnie Alderman, RN, MSN, CDE Diabetes Coordinator Inpatient Diabetes Program [phone] (Team Pager from 8am to 5pm)

## 2017-06-24 NOTE — Progress Notes (Addendum)
STROKE TEAM PROGRESS NOTE   SUBJECTIVE (INTERVAL HISTORY) No family is at the bedside.  Patient is found sitting in chair feeding himself breakfast. Overall he feels his condition is gradually improving.  Patient states he remains weak on the left side but he feels it is better than yesterday.Voices no new complaints. No new events reported overnight.  OBJECTIVE Lab Results: CBC:  Recent Labs  Lab 06/22/17 1836 06/22/17 1840 06/23/17 0445 06/24/17 0400  WBC 7.1  --  6.1 7.3  HGB 12.7* 13.6 12.1* 12.4*  HCT 38.6* 40.0 36.9* 37.5*  MCV 87.1  --  86.6 86.4  PLT 178  --  166 170   BMP: Recent Labs  Lab 06/22/17 1836 06/22/17 1840 06/23/17 0445 06/24/17 0400  NA 140 140 138 137  K 4.3 4.6 4.2 3.9  CL 108 106 108 109  CO2 25  --  24 20*  GLUCOSE 140* 137* 203* 62*  BUN 44* 44* 40* 30*  CREATININE 2.59* 2.70* 2.32* 1.76*  CALCIUM 9.5  --  9.1 9.0   Urine Drug Screen:     Component Value Date/Time   LABOPIA NONE DETECTED 06/23/2017 0655   COCAINSCRNUR NONE DETECTED 06/23/2017 0655   LABBENZ NONE DETECTED 06/23/2017 0655   AMPHETMU NONE DETECTED 06/23/2017 0655   THCU NONE DETECTED 06/23/2017 0655   LABBARB NONE DETECTED 06/23/2017 0655    Alcohol Level:  Recent Labs  Lab 06/22/17 1836  ETH <10   PHYSICAL EXAM Temp:  [97.9 F (36.6 C)-98.3 F (36.8 C)] 97.9 F (36.6 C) (11/19 0932) Pulse Rate:  [58-75] 61 (11/19 0932) Resp:  [18-20] 20 (11/19 0932) BP: (143-179)/(65-85) 179/65 (11/19 0932) SpO2:  [97 %-100 %] 97 % (11/19 0932) General - Well nourished, well developed, in no apparent distress Respiratory - Lungs clear bilaterally. No wheezing. Cardiovascular - Regular rate and rhythm  Neurologic Examination:  Mental Status:  Alert, oriented, thought content appropriate. Mild to moderate dysarthria. Able to follow 3 step commands without difficulty.  Cranial Nerves:  II- blind OD. Left eye has decreased visual acuity. He is only able to see what is directly in  front of him. This is not new. III/IV/VI- left pupil pinpoint and sluggish. Extraocular movements were full.  V/VII-no facial numbness and mild left facial droop.  VIII-hearing normal.  X-Symmetrical palatal movement.  XII-midline tongue extension  Motor: 5/5 strength in RUE and RLE, but left UE pronator drift and left hand dexterity difficulty, and left LE distal 4/5.  Muscle tone normal throughout. Sensory: Intact to light touch in all extremities. Deep Tendon Reflexes: 2/4 throughout Plantars: Downgoing bilaterally  Cerebellar: Normal finger to nose and heel to shin bilaterally. Gait: deferred  IMAGING: I have personally reviewed the radiological images below and agree with the radiology interpretations.  Dg Chest 1 View 06/22/2017 IMPRESSION:  No active disease.   Ct Head Wo Contrast 06/22/2017 IMPRESSION:  Chronic atrophic and ischemic changes without acute abnormality.  Mr Brain Wo Contrast 06/23/2017 IMPRESSION:  1. 9 mm acute ischemic nonhemorrhagic infarct involving the right paramedian pons.  2. Advanced chronic microvascular ischemic disease with multiple remote lacunar infarcts present within the    bilateral thalami and and pons.   Mr Jodene Nam Head Wo Contrast Result Date: 06/23/2017  IMPRESSION: 1. No acute finding or change from 11/21/2016. No flow limiting stenosis. 2. Nonvisualized right vertebral artery which is likely developmental. No occluded right V4 segment seen on conventional brain MRI. 3. Mild right P1 segment atheromatous type narrowing.  Echocardiogram: not done  PENDING B/L Carotid U/S: 1-39% ICA stenosis _____________________________________________________________________ ASSESSMENT: Mr. Darnelle Corp Tompkins is a 66 y.o. male with PMH of previous strokes (lacunar infarcts in the pons and bilateral thalami), neuropathy, ongoing tobacco use, legally blind right eye, hypertension, hyperlipidemia, diabetes mellitus, chronic kidney disease, and depression  admitted to [facility] on Saturday 06/22/2017 for further evaluation of bilateral lower extremity weakness, left upper extremity weakness, and speech difficulties. This is patients 3rd stroke for the last 6 months  ACUTE RIGHT PONS STROKE:  Suspected Etiology: small vessel disease secondary to uncontrolled risk factors Resultant Symptoms: bilateral lower extremity weakness, left upper extremity weakness, and speech difficulties   Stroke Risk Factors: diabetes mellitus, hyperlipidemia and hypertension, Hx strokes, Family Hx strokes Other Stroke Risk Factors: Advanced age, Tobacco Abuse, CKD  Outstanding Stroke Work-up Studies: Echocardiogram: not done PENDING  PLAN  06/24/2017: Started DAPT as he failed plavix only PTA.  Currently on ASA 325 mg daily  Plavix 75 mg daily PTA. Ongoing aggressive stroke risk factor management Patient counseled to be compliant with antithrombotic medications Discharge home with [facility] and close PCP follow up Follow with [facility] in [place] and Dr. Merlene Laughter in [place]: Previous strokes (lacunar infarcts in the pons and bilateral thalami Baseline Findings: B/L LE weakness and left upper extremity weakness  HYPERTENSION: Stable Permissive hypertension (OK if <220/120) for 24-48 hours post stroke and then gradually normalized within 5-7 days. Long term BP goal normotensive. May slowly restart home B/P medications after 48 hours Home Meds: Norvasc,   HYPERLIPIDEMIA:    Component Value Date/Time   CHOL 121 06/24/2017 0400   TRIG 78 06/24/2017 0400   HDL 32 (L) 06/24/2017 0400   CHOLHDL 3.8 06/24/2017 0400   VLDL 16 06/24/2017 0400   LDLCALC 73 06/24/2017 0400   Home Meds:  Lipitor 80 mg LDL  goal < 70 Continued on  Lipitor to 80 mg daily Continue statin at discharge  DIABETES: Lab Results  Component Value Date   HGBA1C 9.4 (H) 06/23/2017   Recent Labs  Lab 06/23/17 1622 06/23/17 2147 06/24/17 0651 06/24/17 0712  06/24/17 0753  GLUCAP 128* 98 52* 51* 149*  HgbA1c goal < 7.0 Currently on: Lantus, Novolog Continue CBG monitoring and SSI DM education   TOBACCO ABUSE Current smoker Smoking cessation counseling provided Nicotine patch provided  Other Active Problems: Active Problems:   Acute CVA (cerebrovascular accident) ([place])   Left-sided weakness   Cerebral thrombosis with cerebral infarction  Hospital day # 1  VTE prophylaxis: Lovenox  Diet : Diet heart healthy/carb modified Room service appropriate? Yes; Fluid consistency: Thin  Hospital Stroke Medications: ASA 325 mg, Plavix 75 mg, Lipitor 80 mg Home Stroke Medications: Plavix 75 mg, Lipitor 80 mg Stroke Meds Plan: clopidogrel 75 mg daily Prior to Admission,  Now on aspirin 325 mg daily and clopidogrel 75 mg daily  Discharge Stroke Meds: Please discharge patient on aspirin 325 mg daily and clopidogrel 75 mg daily   Disposition: 01-Home or Self Care Therapy Recs:   Follow Recs: Follow up with Neurology at [facility] in 6 weeks  FAMILY UPDATES: No family at bedside   Renie Ora Stroke Neurology Team 06/24/2017 11:53 AM   I reviewed above note and agree with the assessment and plan. I have made any additions or clarifications directly to the above note. Pt was seen and examined. Stroke work up showed right paramedian pontine infarct. CUS negative. MRA unremarkable with  right VA hypoplastic. TTE pending. Pt stroke most likely due to small vessel disease with uncontrolled risk factors. PT/OT recommend [facility] PT/OT. Continue DAPT for 3 months and then plavix alone. On high dose lipitor. Pt counseled on HTN and DM control, as well as smoking cessation. Neurology to sign-off at this time. Please call with any further questions or concerns. Continue to follow with [facility] in [facility] and Neurology Dr. Merlene Laughter in [place]. Thank you for this consultation.  Rosalin Hawking, MD PhD Stroke Neurology 06/24/2017 3:27 PM       To  contact Stroke Continuity provider, please refer to [email or web address] After hours, contact General Neurology

## 2017-06-24 NOTE — Care Management Note (Signed)
Case Management Note  Patient Details  Name: Trevor Crawford MRN: [id number] Date of Birth: 1951-05-19  Subjective/Objective:    Pt admitted with CVA. He is from home with spouse.              Action/Plan: Awaiting PT/OT recommendations. CM following for d/c needs, physician orders.  Expected Discharge Date:  06/26/17               Expected Discharge Plan:     In-House Referral:     Discharge planning Services     Post Acute Care Choice:    Choice offered to:     DME Arranged:    DME Agency:     HH Arranged:    HH Agency:     Status of Service:  In process, will continue to follow  If discussed at Long Length of Stay Meetings, dates discussed:    Additional Comments:  Pollie Friar, RN 06/24/2017, 10:21 AM

## 2017-06-24 NOTE — Evaluation (Signed)
Clinical/Bedside Swallow Evaluation Patient Details  Name: Trevor Crawford MRN: [id number] Date of Birth: 08/27/1950  Today's Date: 06/24/2017 Time: SLP Start Time (ACUTE ONLY): 1525 SLP Stop Time (ACUTE ONLY): 1545 SLP Time Calculation (min) (ACUTE ONLY): 20 min  Past Medical History:  Past Medical History:  Diagnosis Date  . Arthritis   . Cataract    bilateral  . Chronic kidney disease   . Depression   . Diabetes mellitus without complication ([place])   . GERD (gastroesophageal reflux disease)   . Glaucoma   . Hyperlipidemia   . Hypertension   . Legally blind in right eye, as defined in [place]   . Neuromuscular disorder ([place])   . Neuropathy   . Stroke [facility])    Past Surgical History:  Past Surgical History:  Procedure Laterality Date  . CATARACT EXTRACTION Left   . CYSTOSCOPY WITH RETROGRADE PYELOGRAM, AND LEFT STENT PLACEMENT Left 10/06/2015   Performed by Cleon Gustin, MD at [facility] ORS   HPI:  66 year old male admitted 06/22/17 PMH significant for  MRI revealed right paramedian pons infarct (46mm ischemic), advanced chronic microvascular ischemic disease with multiple remote lacunar infarcts. CXR = no active disease   Assessment / Plan / Recommendation Clinical Impression  Pt presents with adequate oral motor strength and function. No overt s/s aspiration observed or reported on any consistency. Recommend continuing with regular diet and thin liquids. No further ST intervention recommended at this time. Please reconsult if needs arise.     SLP Visit Diagnosis: Dysphagia, unspecified (R13.10)    Aspiration Risk  Mild aspiration risk    Diet Recommendation Regular;Thin liquid   Liquid Administration via: Cup;Straw Medication Administration: Whole meds with liquid Supervision: Patient able to self feed Compensations: Minimize environmental distractions;Small sips/bites;Slow rate Postural Changes: Seated upright at 90 degrees    Other  Recommendations Oral Care  Recommendations: Oral care BID   Follow up Recommendations None      Frequency and Duration   n/a         Prognosis Prognosis for Safe Diet Advancement: Good      Swallow Study   General HPI: 66 year old male admitted 06/22/17 PMH significant for  MRI revealed right paramedian pons infarct (9mm ischemic), advanced chronic microvascular ischemic disease with multiple remote lacunar infarcts. CXR = no active disease Type of Study: Bedside Swallow Evaluation Previous Swallow Assessment: screened in April and August 2018 Diet Prior to this Study: Regular;Thin liquids Temperature Spikes Noted: No Respiratory Status: Room air History of Recent Intubation: No Behavior/Cognition: Alert;Cooperative Oral Cavity Assessment: Within Functional Limits Oral Care Completed by SLP: No Oral Cavity - Dentition: Missing dentition Vision: Functional for self-feeding Self-Feeding Abilities: Able to feed self Patient Positioning: Upright in bed Baseline Vocal Quality: Normal Volitional Cough: Strong Volitional Swallow: Able to elicit    Oral/Motor/Sensory Function Overall Oral Motor/Sensory Function: Within functional limits   Ice Chips Ice chips: Not tested   Thin Liquid Thin Liquid: Within functional limits Presentation: Straw    Nectar Thick Nectar Thick Liquid: Not tested   Honey Thick Honey Thick Liquid: Not tested   Puree Puree: Within functional limits Presentation: Self Fed   Solid   GO   Solid: Within functional limits Presentation: Trevor Crawford, [facility] [place] Speech Language Pathologist [phone]  Shonna Chock 06/24/2017,3:58 PM

## 2017-06-24 NOTE — Progress Notes (Signed)
Patient alert and oriented x 4, CBG this Am was 52 patient given two cups of apple juice and encourage to eat. CBG at this time is 149 will continue follow up.

## 2017-06-24 NOTE — Care Management Note (Signed)
Case Management Note  Patient Details  Name: Trevor Crawford MRN: [id number] Date of Birth: [birth year]  Subjective/Objective:                    Action/Plan: Pt discharging home with orders for [facility] services. CM met with the patient and called his wife and provided choice of [place] agencies. They selected [facility]. Jermaine with [facility] notified and accepted the referral.  Patients wife to provide transportation home.   Expected Discharge Date:  06/24/17               Expected Discharge Plan:  [place]  In-House Referral:     Discharge planning Services  CM Consult  Post Acute Care Choice:  Home Health Choice offered to:  Patient, Spouse  DME Arranged:    DME Agency:     HH Arranged:  PT, OT HH Agency:  [place]  Status of Service:  Completed, signed off  If discussed at [place] of Stay Meetings, dates discussed:    Additional Comments:  Pollie Friar, RN 06/24/2017, 4:24 PM

## 2017-06-24 NOTE — Progress Notes (Signed)
Pt being discharged from hospital per orders from MD. Pt and family educated on discharge instructions. Pt and family verbalized understanding of instructions. All questions and concerns were addressed. Pt's IV was removed prior to discharge. Pt exited hospital via wheelchair. 

## 2017-06-25 LAB — VAS US CAROTID
LCCADSYS: -151 cm/s
LEFT ECA DIAS: -19 cm/s
LEFT VERTEBRAL DIAS: 14 cm/s
Left CCA dist dias: -22 cm/s
Left CCA prox dias: 15 cm/s
Left CCA prox sys: 111 cm/s
Left ICA dist dias: -23 cm/s
Left ICA dist sys: -69 cm/s
Left ICA prox dias: -12 cm/s
Left ICA prox sys: -48 cm/s
RCCADSYS: -75 cm/s
RCCAPDIAS: 11 cm/s
RCCAPSYS: 93 cm/s
RIGHT ECA DIAS: -9 cm/s
RIGHT VERTEBRAL DIAS: 3 cm/s

## 2017-06-28 ENCOUNTER — Other Ambulatory Visit: Payer: Self-pay

## 2017-06-28 NOTE — Patient Outreach (Signed)
Patient triggered Red on EMMI Stroke Dashboard.  Notification sent to Enzo Montgomery, RN

## 2017-06-28 NOTE — Patient Outreach (Signed)
[facility]) Care Management  06/28/2017  [place] 05-16-1951 [id number]     EMMI- STROKE RED ON EMMI ALERT Day # 1 Date: 06/26/17 Red Alert Reason: " Scheduled a follow up appt? No"   Outreach attempt # 1 to patient. Spoke with patient. He states he is doing fine since discharge home. Reviewed and addressed red alert with patient. He has not had a chance to make f/u appts except [facility]. Patient reports he has d/c paperwork with MD f/u appts on form and will call office to make an appt. Advised patient that most MD offices may be closed today due to holiday and to try to reach office on next business day. He voiced understanding.He has transportation to get back and forth to appts. He voices he has all his meds. He voices he gets his meds through the [place] and will get his meds reviewed and refilled at upcoming appt. [facility] HHPT has been in contact with patient and will be out next week to visit him. Patient denies any further RN CM needs or concerns at this time. Advised patient that they would continue to get automated EMMI-Stroke post discharge calls to assess how they are doing following recent hospitalization and will receive a call from a nurse if any of their responses were abnormal. Patient voiced understanding and was appreciative of f/u call.     Plan: RN CM will notify [facility] administrative assistant of case status.    Enzo Montgomery, RN,BSN,CCM [facility] [facility] Telephonic Care Management Coordinator Direct Phone: [phone] Toll Free: [phone] Fax: [phone]

## 2017-07-01 DIAGNOSIS — Z794 Long term (current) use of insulin: Secondary | ICD-10-CM | POA: Diagnosis not present

## 2017-07-01 DIAGNOSIS — Z7902 Long term (current) use of antithrombotics/antiplatelets: Secondary | ICD-10-CM | POA: Diagnosis not present

## 2017-07-01 DIAGNOSIS — I10 Essential (primary) hypertension: Secondary | ICD-10-CM | POA: Diagnosis not present

## 2017-07-01 DIAGNOSIS — Z7982 Long term (current) use of aspirin: Secondary | ICD-10-CM | POA: Diagnosis not present

## 2017-07-01 DIAGNOSIS — I69354 Hemiplegia and hemiparesis following cerebral infarction affecting left non-dominant side: Secondary | ICD-10-CM | POA: Diagnosis not present

## 2017-07-01 DIAGNOSIS — E785 Hyperlipidemia, unspecified: Secondary | ICD-10-CM | POA: Diagnosis not present

## 2017-07-01 DIAGNOSIS — E1165 Type 2 diabetes mellitus with hyperglycemia: Secondary | ICD-10-CM | POA: Diagnosis not present

## 2017-07-01 DIAGNOSIS — N400 Enlarged prostate without lower urinary tract symptoms: Secondary | ICD-10-CM | POA: Diagnosis not present

## 2017-07-01 DIAGNOSIS — K219 Gastro-esophageal reflux disease without esophagitis: Secondary | ICD-10-CM | POA: Diagnosis not present

## 2017-07-02 ENCOUNTER — Ambulatory Visit (INDEPENDENT_AMBULATORY_CARE_PROVIDER_SITE_OTHER): Payer: Medicare Other | Admitting: Family Medicine

## 2017-07-02 ENCOUNTER — Encounter: Payer: Self-pay | Admitting: Family Medicine

## 2017-07-02 VITALS — BP 99/63 | HR 98 | Temp 98.0°F

## 2017-07-02 DIAGNOSIS — N183 Chronic kidney disease, stage 3 unspecified: Secondary | ICD-10-CM

## 2017-07-02 DIAGNOSIS — E1142 Type 2 diabetes mellitus with diabetic polyneuropathy: Secondary | ICD-10-CM | POA: Diagnosis not present

## 2017-07-02 DIAGNOSIS — I10 Essential (primary) hypertension: Secondary | ICD-10-CM | POA: Diagnosis not present

## 2017-07-02 DIAGNOSIS — Z794 Long term (current) use of insulin: Secondary | ICD-10-CM | POA: Diagnosis not present

## 2017-07-02 MED ORDER — INSULIN GLARGINE 100 UNIT/ML SOLOSTAR PEN: 29.0000 [IU] | PEN_INJECTOR | Freq: Every day | SUBCUTANEOUS | 11 refills | Status: AC

## 2017-07-02 MED ORDER — AMLODIPINE BESYLATE 2.5 MG PO TABS: 2.5000 mg | ORAL_TABLET | Freq: Every day | ORAL | 3 refills | Status: DC

## 2017-07-02 NOTE — Patient Instructions (Signed)
Great to see you!  Call today to get an appointment at the [facility] for endocrinology.

## 2017-07-02 NOTE — Progress Notes (Signed)
   HPI  Patient presents today here to follow-up from hospitalization.  Patient was recently hospitalized for new subacute CVA. His symptoms are overall stable.    Today he and his daughter state that he feels more weak than usual.  He was previously eating a high salt diet and has cut back quite a bit, they believe that his blood pressure is likely little bit low because of this. He has been up walking around with a cane.  His left-sided facial droop and left hand weakness are stable since the stroke.  Diabetes They had difficulty stopping glipizide, they state that blood sugar is in 3-400 ranges without glipizide.  He has been taking 29 units of Lantus as well. [facility] I have encouraged him to call today for follow-up.   PMH: Smoking status noted ROS: Per HPI  Objective: BP 99/63   Pulse 98   Temp 98 F (36.7 C) (Oral)  Gen: NAD, alert, cooperative with exam HEENT: NCAT, left-sided facial droop with nasolabial fold flattening CV: RRR, good S1/S2, no murmur Resp: CTABL, no wheezes, non-labored Ext: No edema, warm Neuro: Alert and oriented, patient in a wheelchair, however has 5/5 quad strength in bilateral legs with intact sensation, 3-4/5 grip strength on the left versus 5/5 on the right  Assessment and plan:  #Hypertension Soft Blood pressure today, certainly cutting back on salt could be because of this Decrease amlodipine to 2.5 mg Continue Lasix  #Type 2 diabetes Uncontrolled, I have encouraged him to call today for endocrinology follow-up. I recommended cutting back on glipizide by half and continuing current Lantus dose, he has not had any low blood sugars since being home.   #Chronic kidney disease stage III Repeat labs, doing well overall Avoiding NSAIDs except for aspirin for antiplatelet      Orders Placed This Encounter  Procedures  . BMP8+EGFR  . CBC with Differential/Platelet    Meds ordered this  encounter  Medications  . amLODipine (NORVASC) 2.5 MG tablet    Sig: Take 1 tablet (2.5 mg total) by mouth daily.    Dispense:  90 tablet    Refill:  3  . Insulin Glargine (LANTUS SOLOSTAR) 100 UNIT/ML Solostar Pen    Sig: Inject 29 Units into the skin daily.    Dispense:  15 mL    Refill:  11    Sam , MD [facility] 07/02/2017, 11:43 AM     

## 2017-07-03 LAB — BMP8+EGFR
BUN/Creatinine Ratio: 17 (ref 10–24)
BUN: 41 mg/dL — ABNORMAL HIGH (ref 8–27)
CO2: 21 mmol/L (ref 20–29)
Calcium: 9.9 mg/dL (ref 8.6–10.2)
Chloride: 104 mmol/L (ref 96–106)
Creatinine, Ser: 2.37 mg/dL — ABNORMAL HIGH (ref 0.76–1.27)
GFR calc Af Amer: 32 mL/min/1.73 — ABNORMAL LOW (ref >59)
GFR calc non Af Amer: 28 mL/min/1.73 — ABNORMAL LOW (ref >59)
Glucose: 110 mg/dL — ABNORMAL HIGH (ref 65–99)
Potassium: 4.7 mmol/L (ref 3.5–5.2)
Sodium: 142 mmol/L (ref 134–144)

## 2017-07-03 LAB — CBC WITH DIFFERENTIAL/PLATELET
Basophils Absolute: 0 x10E3/uL (ref 0.0–0.2)
Basos: 0 %
EOS (ABSOLUTE): 0.3 x10E3/uL (ref 0.0–0.4)
Eos: 4 %
Hematocrit: 38.6 % (ref 37.5–51.0)
Hemoglobin: 12.8 g/dL — ABNORMAL LOW (ref 13.0–17.7)
Immature Grans (Abs): 0 x10E3/uL (ref 0.0–0.1)
Immature Granulocytes: 0 %
Lymphocytes Absolute: 1.6 x10E3/uL (ref 0.7–3.1)
Lymphs: 17 %
MCH: 29.4 pg (ref 26.6–33.0)
MCHC: 33.2 g/dL (ref 31.5–35.7)
MCV: 89 fL (ref 79–97)
Monocytes Absolute: 1 x10E3/uL — ABNORMAL HIGH (ref 0.1–0.9)
Monocytes: 11 %
Neutrophils Absolute: 6.2 x10E3/uL (ref 1.4–7.0)
Neutrophils: 68 %
Platelets: 237 x10E3/uL (ref 150–379)
RBC: 4.36 x10E6/uL (ref 4.14–5.80)
RDW: 14.5 % (ref 12.3–15.4)
WBC: 9.2 x10E3/uL (ref 3.4–10.8)

## 2017-07-04 DIAGNOSIS — Z7982 Long term (current) use of aspirin: Secondary | ICD-10-CM | POA: Diagnosis not present

## 2017-07-04 DIAGNOSIS — I69354 Hemiplegia and hemiparesis following cerebral infarction affecting left non-dominant side: Secondary | ICD-10-CM | POA: Diagnosis not present

## 2017-07-04 DIAGNOSIS — I10 Essential (primary) hypertension: Secondary | ICD-10-CM | POA: Diagnosis not present

## 2017-07-04 DIAGNOSIS — Z7902 Long term (current) use of antithrombotics/antiplatelets: Secondary | ICD-10-CM | POA: Diagnosis not present

## 2017-07-04 DIAGNOSIS — N400 Enlarged prostate without lower urinary tract symptoms: Secondary | ICD-10-CM | POA: Diagnosis not present

## 2017-07-04 DIAGNOSIS — Z794 Long term (current) use of insulin: Secondary | ICD-10-CM | POA: Diagnosis not present

## 2017-07-04 DIAGNOSIS — E1165 Type 2 diabetes mellitus with hyperglycemia: Secondary | ICD-10-CM | POA: Diagnosis not present

## 2017-07-04 DIAGNOSIS — E785 Hyperlipidemia, unspecified: Secondary | ICD-10-CM | POA: Diagnosis not present

## 2017-07-04 DIAGNOSIS — K219 Gastro-esophageal reflux disease without esophagitis: Secondary | ICD-10-CM | POA: Diagnosis not present

## 2017-07-05 DIAGNOSIS — K219 Gastro-esophageal reflux disease without esophagitis: Secondary | ICD-10-CM | POA: Diagnosis not present

## 2017-07-05 DIAGNOSIS — E1165 Type 2 diabetes mellitus with hyperglycemia: Secondary | ICD-10-CM | POA: Diagnosis not present

## 2017-07-05 DIAGNOSIS — Z7982 Long term (current) use of aspirin: Secondary | ICD-10-CM | POA: Diagnosis not present

## 2017-07-05 DIAGNOSIS — E785 Hyperlipidemia, unspecified: Secondary | ICD-10-CM | POA: Diagnosis not present

## 2017-07-05 DIAGNOSIS — I69354 Hemiplegia and hemiparesis following cerebral infarction affecting left non-dominant side: Secondary | ICD-10-CM | POA: Diagnosis not present

## 2017-07-05 DIAGNOSIS — Z7902 Long term (current) use of antithrombotics/antiplatelets: Secondary | ICD-10-CM | POA: Diagnosis not present

## 2017-07-05 DIAGNOSIS — N400 Enlarged prostate without lower urinary tract symptoms: Secondary | ICD-10-CM | POA: Diagnosis not present

## 2017-07-05 DIAGNOSIS — I10 Essential (primary) hypertension: Secondary | ICD-10-CM | POA: Diagnosis not present

## 2017-07-05 DIAGNOSIS — Z794 Long term (current) use of insulin: Secondary | ICD-10-CM | POA: Diagnosis not present

## 2017-07-09 DIAGNOSIS — I69354 Hemiplegia and hemiparesis following cerebral infarction affecting left non-dominant side: Secondary | ICD-10-CM | POA: Diagnosis not present

## 2017-07-09 DIAGNOSIS — N400 Enlarged prostate without lower urinary tract symptoms: Secondary | ICD-10-CM | POA: Diagnosis not present

## 2017-07-09 DIAGNOSIS — I10 Essential (primary) hypertension: Secondary | ICD-10-CM | POA: Diagnosis not present

## 2017-07-09 DIAGNOSIS — Z7902 Long term (current) use of antithrombotics/antiplatelets: Secondary | ICD-10-CM | POA: Diagnosis not present

## 2017-07-09 DIAGNOSIS — K219 Gastro-esophageal reflux disease without esophagitis: Secondary | ICD-10-CM | POA: Diagnosis not present

## 2017-07-09 DIAGNOSIS — Z794 Long term (current) use of insulin: Secondary | ICD-10-CM | POA: Diagnosis not present

## 2017-07-09 DIAGNOSIS — E1165 Type 2 diabetes mellitus with hyperglycemia: Secondary | ICD-10-CM | POA: Diagnosis not present

## 2017-07-09 DIAGNOSIS — Z7982 Long term (current) use of aspirin: Secondary | ICD-10-CM | POA: Diagnosis not present

## 2017-07-09 DIAGNOSIS — E785 Hyperlipidemia, unspecified: Secondary | ICD-10-CM | POA: Diagnosis not present

## 2017-07-11 DIAGNOSIS — I69354 Hemiplegia and hemiparesis following cerebral infarction affecting left non-dominant side: Secondary | ICD-10-CM | POA: Diagnosis not present

## 2017-07-11 DIAGNOSIS — E1165 Type 2 diabetes mellitus with hyperglycemia: Secondary | ICD-10-CM | POA: Diagnosis not present

## 2017-07-11 DIAGNOSIS — Z7982 Long term (current) use of aspirin: Secondary | ICD-10-CM | POA: Diagnosis not present

## 2017-07-11 DIAGNOSIS — Z794 Long term (current) use of insulin: Secondary | ICD-10-CM | POA: Diagnosis not present

## 2017-07-11 DIAGNOSIS — E785 Hyperlipidemia, unspecified: Secondary | ICD-10-CM | POA: Diagnosis not present

## 2017-07-11 DIAGNOSIS — Z7902 Long term (current) use of antithrombotics/antiplatelets: Secondary | ICD-10-CM | POA: Diagnosis not present

## 2017-07-11 DIAGNOSIS — I10 Essential (primary) hypertension: Secondary | ICD-10-CM | POA: Diagnosis not present

## 2017-07-11 DIAGNOSIS — N400 Enlarged prostate without lower urinary tract symptoms: Secondary | ICD-10-CM | POA: Diagnosis not present

## 2017-07-11 DIAGNOSIS — K219 Gastro-esophageal reflux disease without esophagitis: Secondary | ICD-10-CM | POA: Diagnosis not present

## 2017-07-18 DIAGNOSIS — I69354 Hemiplegia and hemiparesis following cerebral infarction affecting left non-dominant side: Secondary | ICD-10-CM | POA: Diagnosis not present

## 2017-07-18 DIAGNOSIS — N400 Enlarged prostate without lower urinary tract symptoms: Secondary | ICD-10-CM | POA: Diagnosis not present

## 2017-07-18 DIAGNOSIS — Z7902 Long term (current) use of antithrombotics/antiplatelets: Secondary | ICD-10-CM | POA: Diagnosis not present

## 2017-07-18 DIAGNOSIS — I10 Essential (primary) hypertension: Secondary | ICD-10-CM | POA: Diagnosis not present

## 2017-07-18 DIAGNOSIS — K219 Gastro-esophageal reflux disease without esophagitis: Secondary | ICD-10-CM | POA: Diagnosis not present

## 2017-07-18 DIAGNOSIS — Z794 Long term (current) use of insulin: Secondary | ICD-10-CM | POA: Diagnosis not present

## 2017-07-18 DIAGNOSIS — E1165 Type 2 diabetes mellitus with hyperglycemia: Secondary | ICD-10-CM | POA: Diagnosis not present

## 2017-07-18 DIAGNOSIS — Z7982 Long term (current) use of aspirin: Secondary | ICD-10-CM | POA: Diagnosis not present

## 2017-07-18 DIAGNOSIS — E785 Hyperlipidemia, unspecified: Secondary | ICD-10-CM | POA: Diagnosis not present

## 2017-07-25 ENCOUNTER — Ambulatory Visit (INDEPENDENT_AMBULATORY_CARE_PROVIDER_SITE_OTHER): Payer: Medicare Other

## 2017-07-25 DIAGNOSIS — E1165 Type 2 diabetes mellitus with hyperglycemia: Secondary | ICD-10-CM

## 2017-07-25 DIAGNOSIS — I10 Essential (primary) hypertension: Secondary | ICD-10-CM | POA: Diagnosis not present

## 2017-07-25 DIAGNOSIS — I69354 Hemiplegia and hemiparesis following cerebral infarction affecting left non-dominant side: Secondary | ICD-10-CM

## 2017-07-25 DIAGNOSIS — E785 Hyperlipidemia, unspecified: Secondary | ICD-10-CM

## 2017-10-16 ENCOUNTER — Telehealth: Payer: Self-pay | Admitting: Family Medicine

## 2017-10-17 NOTE — Telephone Encounter (Signed)
I have in the chart that April Wermuth is his wife. Is this the April Hayes he is referring to?   Laroy Apple, MD [facility] 10/17/2017, 2:46 PM

## 2017-10-17 NOTE — Telephone Encounter (Signed)
Attempted to return call- NVM  

## 2017-10-21 ENCOUNTER — Telehealth: Payer: Self-pay | Admitting: Family Medicine

## 2017-10-21 NOTE — Telephone Encounter (Signed)
Spoke with April- yes she is his wife and the one he is referring to. Please advise

## 2017-10-21 NOTE — Telephone Encounter (Signed)
lmtcb - does it only need to say April Mcconaughey is his caregiver?

## 2017-10-21 NOTE — Telephone Encounter (Signed)
Letter wrote and placed up front- wife aware

## 2017-10-21 NOTE — Telephone Encounter (Signed)
[place] with note. Pt is dismissed and should look for a new PCP.   Laroy Apple, MD [facility] 10/21/2017, 9:52 AM

## 2017-11-02 DIAGNOSIS — E161 Other hypoglycemia: Secondary | ICD-10-CM | POA: Diagnosis not present

## 2020-05-09 DIAGNOSIS — E1165 Type 2 diabetes mellitus with hyperglycemia: Secondary | ICD-10-CM | POA: Diagnosis not present

## 2020-05-09 DIAGNOSIS — I10 Essential (primary) hypertension: Secondary | ICD-10-CM | POA: Diagnosis not present

## 2020-05-09 DIAGNOSIS — Z8673 Personal history of transient ischemic attack (TIA), and cerebral infarction without residual deficits: Secondary | ICD-10-CM | POA: Diagnosis not present

## 2020-05-09 DIAGNOSIS — E118 Type 2 diabetes mellitus with unspecified complications: Secondary | ICD-10-CM | POA: Diagnosis not present

## 2020-05-09 DIAGNOSIS — Z23 Encounter for immunization: Secondary | ICD-10-CM | POA: Diagnosis not present

## 2020-05-10 DIAGNOSIS — E1165 Type 2 diabetes mellitus with hyperglycemia: Secondary | ICD-10-CM | POA: Diagnosis not present

## 2020-05-10 DIAGNOSIS — I10 Essential (primary) hypertension: Secondary | ICD-10-CM | POA: Diagnosis not present

## 2020-05-20 DIAGNOSIS — I10 Essential (primary) hypertension: Secondary | ICD-10-CM | POA: Diagnosis not present

## 2020-05-20 DIAGNOSIS — E118 Type 2 diabetes mellitus with unspecified complications: Secondary | ICD-10-CM | POA: Diagnosis not present

## 2020-05-20 DIAGNOSIS — N170 Acute kidney failure with tubular necrosis: Secondary | ICD-10-CM | POA: Diagnosis not present

## 2020-05-20 DIAGNOSIS — F17210 Nicotine dependence, cigarettes, uncomplicated: Secondary | ICD-10-CM | POA: Diagnosis not present

## 2020-05-20 DIAGNOSIS — Z0001 Encounter for general adult medical examination with abnormal findings: Secondary | ICD-10-CM | POA: Diagnosis not present

## 2020-05-20 DIAGNOSIS — D631 Anemia in chronic kidney disease: Secondary | ICD-10-CM | POA: Diagnosis not present

## 2020-05-20 DIAGNOSIS — I129 Hypertensive chronic kidney disease with stage 1 through stage 4 chronic kidney disease, or unspecified chronic kidney disease: Secondary | ICD-10-CM | POA: Diagnosis not present

## 2020-05-20 DIAGNOSIS — N189 Chronic kidney disease, unspecified: Secondary | ICD-10-CM | POA: Diagnosis not present

## 2020-05-20 DIAGNOSIS — Z1389 Encounter for screening for other disorder: Secondary | ICD-10-CM | POA: Diagnosis not present

## 2020-05-20 DIAGNOSIS — E875 Hyperkalemia: Secondary | ICD-10-CM | POA: Diagnosis not present

## 2020-05-23 DIAGNOSIS — N170 Acute kidney failure with tubular necrosis: Secondary | ICD-10-CM | POA: Diagnosis not present

## 2020-05-23 DIAGNOSIS — E875 Hyperkalemia: Secondary | ICD-10-CM | POA: Diagnosis not present

## 2020-05-23 DIAGNOSIS — I129 Hypertensive chronic kidney disease with stage 1 through stage 4 chronic kidney disease, or unspecified chronic kidney disease: Secondary | ICD-10-CM | POA: Diagnosis not present

## 2020-05-23 DIAGNOSIS — N189 Chronic kidney disease, unspecified: Secondary | ICD-10-CM | POA: Diagnosis not present

## 2020-05-23 DIAGNOSIS — E1122 Type 2 diabetes mellitus with diabetic chronic kidney disease: Secondary | ICD-10-CM | POA: Diagnosis not present

## 2020-05-23 DIAGNOSIS — E559 Vitamin D deficiency, unspecified: Secondary | ICD-10-CM | POA: Diagnosis not present

## 2020-05-23 DIAGNOSIS — Z79899 Other long term (current) drug therapy: Secondary | ICD-10-CM | POA: Diagnosis not present

## 2020-05-25 ENCOUNTER — Other Ambulatory Visit (HOSPITAL_COMMUNITY): Payer: Self-pay | Admitting: Internal Medicine

## 2020-05-25 ENCOUNTER — Other Ambulatory Visit: Payer: Self-pay | Admitting: Internal Medicine

## 2020-05-25 ENCOUNTER — Other Ambulatory Visit: Payer: Self-pay | Admitting: Gerontology

## 2020-05-25 ENCOUNTER — Other Ambulatory Visit (HOSPITAL_COMMUNITY): Payer: Self-pay | Admitting: Gerontology

## 2020-05-25 ENCOUNTER — Other Ambulatory Visit: Payer: Self-pay

## 2020-05-25 DIAGNOSIS — Z87891 Personal history of nicotine dependence: Secondary | ICD-10-CM

## 2020-06-13 DIAGNOSIS — G8192 Hemiplegia, unspecified affecting left dominant side: Secondary | ICD-10-CM | POA: Diagnosis not present

## 2020-06-13 DIAGNOSIS — E118 Type 2 diabetes mellitus with unspecified complications: Secondary | ICD-10-CM | POA: Diagnosis not present

## 2020-06-13 DIAGNOSIS — N184 Chronic kidney disease, stage 4 (severe): Secondary | ICD-10-CM | POA: Diagnosis not present

## 2020-06-14 ENCOUNTER — Other Ambulatory Visit: Payer: Self-pay

## 2020-06-14 ENCOUNTER — Ambulatory Visit (HOSPITAL_COMMUNITY)
Admission: RE | Admit: 2020-06-14 | Discharge: 2020-06-14 | Disposition: A | Discharge status: Home / Self Care | Payer: Medicare Other | Source: Ambulatory Visit | Attending: Gerontology | Admitting: Gerontology

## 2020-06-14 DIAGNOSIS — Z87891 Personal history of nicotine dependence: Secondary | ICD-10-CM | POA: Diagnosis not present

## 2020-06-15 ENCOUNTER — Other Ambulatory Visit: Payer: Self-pay | Admitting: Nephrology

## 2020-06-15 ENCOUNTER — Other Ambulatory Visit (HOSPITAL_COMMUNITY): Payer: Self-pay | Admitting: Nephrology

## 2020-06-15 DIAGNOSIS — Z716 Tobacco abuse counseling: Secondary | ICD-10-CM

## 2020-06-15 DIAGNOSIS — Z79899 Other long term (current) drug therapy: Secondary | ICD-10-CM

## 2020-06-15 DIAGNOSIS — N189 Chronic kidney disease, unspecified: Secondary | ICD-10-CM

## 2020-06-15 DIAGNOSIS — I129 Hypertensive chronic kidney disease with stage 1 through stage 4 chronic kidney disease, or unspecified chronic kidney disease: Secondary | ICD-10-CM

## 2020-06-15 DIAGNOSIS — E875 Hyperkalemia: Secondary | ICD-10-CM

## 2020-06-15 DIAGNOSIS — E1122 Type 2 diabetes mellitus with diabetic chronic kidney disease: Secondary | ICD-10-CM

## 2020-06-15 DIAGNOSIS — E559 Vitamin D deficiency, unspecified: Secondary | ICD-10-CM

## 2020-06-16 ENCOUNTER — Other Ambulatory Visit: Payer: Self-pay

## 2020-06-16 ENCOUNTER — Ambulatory Visit (HOSPITAL_COMMUNITY)
Admission: RE | Admit: 2020-06-16 | Discharge: 2020-06-16 | Disposition: A | Discharge status: Home / Self Care | Payer: Medicare Other | Source: Ambulatory Visit | Attending: Nephrology | Admitting: Nephrology

## 2020-06-16 ENCOUNTER — Ambulatory Visit (HOSPITAL_COMMUNITY): Admission: RE | Admit: 2020-06-16 | Payer: Medicare Other | Source: Ambulatory Visit

## 2020-06-16 DIAGNOSIS — E875 Hyperkalemia: Secondary | ICD-10-CM | POA: Diagnosis not present

## 2020-06-16 DIAGNOSIS — D631 Anemia in chronic kidney disease: Secondary | ICD-10-CM | POA: Diagnosis not present

## 2020-06-16 DIAGNOSIS — E559 Vitamin D deficiency, unspecified: Secondary | ICD-10-CM | POA: Insufficient documentation

## 2020-06-16 DIAGNOSIS — Z79899 Other long term (current) drug therapy: Secondary | ICD-10-CM | POA: Diagnosis not present

## 2020-06-16 DIAGNOSIS — E1122 Type 2 diabetes mellitus with diabetic chronic kidney disease: Secondary | ICD-10-CM | POA: Insufficient documentation

## 2020-06-16 DIAGNOSIS — N281 Cyst of kidney, acquired: Secondary | ICD-10-CM | POA: Diagnosis not present

## 2020-06-16 DIAGNOSIS — N189 Chronic kidney disease, unspecified: Secondary | ICD-10-CM | POA: Insufficient documentation

## 2020-06-16 DIAGNOSIS — Z716 Tobacco abuse counseling: Secondary | ICD-10-CM | POA: Diagnosis present

## 2020-06-16 DIAGNOSIS — I129 Hypertensive chronic kidney disease with stage 1 through stage 4 chronic kidney disease, or unspecified chronic kidney disease: Secondary | ICD-10-CM | POA: Diagnosis not present

## 2020-06-17 ENCOUNTER — Ambulatory Visit (HOSPITAL_COMMUNITY): Payer: Medicare Other

## 2020-06-17 DIAGNOSIS — N170 Acute kidney failure with tubular necrosis: Secondary | ICD-10-CM | POA: Diagnosis not present

## 2020-06-17 DIAGNOSIS — R809 Proteinuria, unspecified: Secondary | ICD-10-CM | POA: Diagnosis not present

## 2020-06-17 DIAGNOSIS — D631 Anemia in chronic kidney disease: Secondary | ICD-10-CM | POA: Diagnosis not present

## 2020-06-17 DIAGNOSIS — E875 Hyperkalemia: Secondary | ICD-10-CM | POA: Diagnosis not present

## 2020-06-17 DIAGNOSIS — N189 Chronic kidney disease, unspecified: Secondary | ICD-10-CM | POA: Diagnosis not present

## 2020-07-12 ENCOUNTER — Inpatient Hospital Stay (HOSPITAL_COMMUNITY): Payer: Medicare Other | Attending: Hematology and Oncology | Admitting: Hematology and Oncology

## 2020-07-12 ENCOUNTER — Inpatient Hospital Stay (HOSPITAL_COMMUNITY): Payer: Medicare Other

## 2020-07-12 ENCOUNTER — Other Ambulatory Visit: Payer: Self-pay

## 2020-07-12 DIAGNOSIS — E1122 Type 2 diabetes mellitus with diabetic chronic kidney disease: Secondary | ICD-10-CM | POA: Diagnosis not present

## 2020-07-12 DIAGNOSIS — E1139 Type 2 diabetes mellitus with other diabetic ophthalmic complication: Secondary | ICD-10-CM | POA: Insufficient documentation

## 2020-07-12 DIAGNOSIS — Z993 Dependence on wheelchair: Secondary | ICD-10-CM | POA: Insufficient documentation

## 2020-07-12 DIAGNOSIS — E1140 Type 2 diabetes mellitus with diabetic neuropathy, unspecified: Secondary | ICD-10-CM | POA: Insufficient documentation

## 2020-07-12 DIAGNOSIS — Z8673 Personal history of transient ischemic attack (TIA), and cerebral infarction without residual deficits: Secondary | ICD-10-CM | POA: Diagnosis not present

## 2020-07-12 DIAGNOSIS — I129 Hypertensive chronic kidney disease with stage 1 through stage 4 chronic kidney disease, or unspecified chronic kidney disease: Secondary | ICD-10-CM | POA: Diagnosis not present

## 2020-07-12 DIAGNOSIS — F17210 Nicotine dependence, cigarettes, uncomplicated: Secondary | ICD-10-CM | POA: Diagnosis not present

## 2020-07-12 DIAGNOSIS — Z809 Family history of malignant neoplasm, unspecified: Secondary | ICD-10-CM | POA: Insufficient documentation

## 2020-07-12 DIAGNOSIS — Z794 Long term (current) use of insulin: Secondary | ICD-10-CM | POA: Diagnosis not present

## 2020-07-12 DIAGNOSIS — D472 Monoclonal gammopathy: Secondary | ICD-10-CM | POA: Diagnosis not present

## 2020-07-12 DIAGNOSIS — N189 Chronic kidney disease, unspecified: Secondary | ICD-10-CM | POA: Insufficient documentation

## 2020-07-12 NOTE — Assessment & Plan Note (Signed)
Lab review 06/16/2020: Kappa: 201.9, lambda 45.1, ratio, 4.48, creatinine: 3.2, calcium 10.2, hemoglobin 11.5, albumin 3.7 Patient has a known history of diabetes and hypertension and chronic kidney disease.  Plan: We will need to rule out MGUS versus myeloma Counseling: I discussed with the patient the spectrum of disorders from MGUS to multiple myeloma. We discussed the role of plasma cells in producing immunoglobulins. We discussed structure of immunoglobulins on how they make up the heavy chains and the light chains. The light chains are Kappa and lambda. I discussed the difference between MGUS and multiple myeloma. MGUS is characterized by elevation monoclonal protein without any end organ damage. Multiple myeloma is associated with elevation monoclonal protein and end organ damage (hypercalcemia, renal dysfunction, anemia, bone lytic lesions ) along with a bone marrow showing greater than 10% plasma cells.  Workup recommended: 1. CBC with differential and CMP 2. serum protein electrophoresis with immunofixation 3. Bone survey  If these tests are abnormal, then we will obtain a bone marrow biopsy.

## 2020-07-12 NOTE — Progress Notes (Signed)
[facility] CONSULT NOTE  Patient Care Team: System, Provider Not In as PCP - General  CHIEF COMPLAINTS/PURPOSE OF CONSULTATION:  Elevated serum light chains  HISTORY OF PRESENTING ILLNESS:  Trevor Crawford 69 y.o. male is here because of recent diagnosis of elevated serum light chains.  Patient has history of diabetes, hypertension chronic kidney disease, glaucoma, severe neuropathy and prior stroke which left him weak in his left leg, was noted on routine blood work to have elevated serum free light chains of both kappa and lambda.  He was referred to [place] for further evaluation.  He is in a wheelchair and requires assistance with ADLs.  He is completely blind in the right eye and can barely see with his left eye.  I reviewed her records extensively and collaborated the history with the patient.  SUMMARY OF ONCOLOGIC HISTORY: Oncology History   No history exists.     MEDICAL HISTORY:  Past Medical History:  Diagnosis Date  . Arthritis   . Cataract    bilateral  . Chronic kidney disease   . Depression   . Diabetes mellitus without complication ([place])   . GERD (gastroesophageal reflux disease)   . Glaucoma   . Hyperlipidemia   . Hypertension   . Legally blind in right eye, as defined in [place]   . Neuromuscular disorder ([place])   . Neuropathy   . Stroke [facility])     SURGICAL HISTORY: Past Surgical History:  Procedure Laterality Date  . CATARACT EXTRACTION Left   . CYSTOSCOPY WITH RETROGRADE PYELOGRAM, URETEROSCOPY AND STENT PLACEMENT Left 10/06/2015   Procedure: CYSTOSCOPY WITH RETROGRADE PYELOGRAM, AND LEFT STENT PLACEMENT ;  Surgeon: Cleon Gustin, MD;  Location: [facility];  Service: Urology;  Laterality: Left;    SOCIAL HISTORY: Social History   Socioeconomic History  . Marital status: Married    Spouse name: Not on file  . Number of children: Not on file  . Years of education: Not on file  . Highest education level: Not on file  Occupational History  .  Not on file  Tobacco Use  . Smoking status: Current Every Day Smoker    Packs/day: 1.00    Years: 55.00    Pack years: 55.00    Types: Cigarettes  . Smokeless tobacco: Never Used  Substance and Sexual Activity  . Alcohol use: No  . Drug use: No  . Sexual activity: Never    Birth control/protection: None  Other Topics Concern  . Not on file  Social History Narrative  . Not on file   Social Determinants of Health   Financial Resource Strain: Medium Risk  . Difficulty of Paying Living Expenses: Somewhat hard  Food Insecurity: No Food Insecurity  . Worried About Charity fundraiser in the Last Year: Never true  . Ran Out of Food in the Last Year: Never true  Transportation Needs: No Transportation Needs  . Lack of Transportation (Medical): No  . Lack of Transportation (Non-Medical): No  Physical Activity: Inactive  . Days of Exercise per Week: 0 days  . Minutes of Exercise per Session: 0 min  Stress: No Stress Concern Present  . Feeling of Stress : Not at all  Social Connections: Socially Isolated  . Frequency of Communication with Friends and Family: Never  . Frequency of Social Gatherings with Friends and Family: Never  . Attends Religious Services: Never  . Active Member of Clubs or Organizations: No  . Attends Archivist Meetings: Never  .  Marital Status: Married  Human resources officer Violence: Not At Risk  . Fear of Current or Ex-Partner: No  . Emotionally Abused: No  . Physically Abused: No  . Sexually Abused: No    FAMILY HISTORY: Family History  Problem Relation Age of Onset  . Cancer Mother   . Alcoholism Father   . Cancer Father   . Cancer Other   . Cancer Sister   . Diabetes Brother   . Stroke Brother     ALLERGIES:  has No Known Allergies.  MEDICATIONS:  Current Outpatient Medications  Medication Sig Dispense Refill  . amLODipine (NORVASC) 2.5 MG tablet Take 1 tablet (2.5 mg total) by mouth daily. 90 tablet 3  . aspirin EC 325 MG EC  tablet Take 1 tablet (325 mg total) daily by mouth. 30 tablet 0  . atorvastatin (LIPITOR) 80 MG tablet Take 1 tablet (80 mg total) at bedtime by mouth. 30 tablet 0  . brimonidine (ALPHAGAN) 0.2 % ophthalmic solution Place 1 drop 2 (two) times daily into the right eye.     . carboxymethylcellulose (REFRESH PLUS) 0.5 % SOLN Place 1 drop 4 (four) times daily into the right eye.     . carvedilol (COREG) 3.125 MG tablet Take by mouth.    . cholecalciferol (VITAMIN D) 1000 units tablet Take 1,000 Units 2 (two) times daily by mouth.    . clopidogrel (PLAVIX) 75 MG tablet Take 1 tablet (75 mg total) daily by mouth. 30 tablet 0  . dorzolamide-timolol (COSOPT) 22.3-6.8 MG/ML ophthalmic solution Place 1 drop 2 (two) times daily into the right eye.     . furosemide (LASIX) 40 MG tablet Take 40 mg 2 (two) times daily by mouth.     . Insulin Glargine (LANTUS SOLOSTAR) 100 UNIT/ML Solostar Pen Inject 29 Units into the skin daily. 15 mL 11  . latanoprost (XALATAN) 0.005 % ophthalmic solution Place 1 drop at bedtime into the right eye.    . ranitidine (ZANTAC) 150 MG tablet Take 150 mg every evening by mouth.    . simethicone (MYLICON) 80 MG chewable tablet Chew 160 mg by mouth 2 (two) times daily as needed for flatulence.    . tamsulosin (FLOMAX) 0.4 MG CAPS capsule Take 1 capsule (0.4 mg total) by mouth daily. 10 capsule 0   No current facility-administered medications for this visit.    REVIEW OF SYSTEMS:   Constitutional: Denies fevers, chills or abnormal night sweats Eyes: Loss of vision in the right eye, very poor vision in the left eye Ears, nose, mouth, throat, and face: Denies mucositis or sore throat Respiratory: Denies cough, dyspnea or wheezes Cardiovascular: Denies palpitation, chest discomfort or lower extremity swelling Gastrointestinal:  Denies nausea, heartburn or change in bowel habits Skin: Denies abnormal skin rashes Lymphatics: Denies new lymphadenopathy or easy bruising Neurological:  Generalized weakness and left-sided stroke related symptoms Behavioral/Psych: Mood is stable, no new changes    All other systems were reviewed with the patient and are negative.  PHYSICAL EXAMINATION: ECOG PERFORMANCE STATUS: 3 - Symptomatic, >50% confined to bed  Vitals:   07/12/20 1317  BP: (!) 169/77  Pulse: 74  Resp: 20  Temp: (!) 97.2 F (36.2 C)  SpO2: 100%   Filed Weights   07/12/20 1317  Weight: 202 lb 3.2 oz (91.7 kg)        LABORATORY DATA:  I have reviewed the data as listed Lab Results  Component Value Date   WBC 9.2 07/02/2017   HGB 12.8 (L)  07/02/2017   HCT 38.6 07/02/2017   MCV 89 07/02/2017   PLT 237 07/02/2017   Lab Results  Component Value Date   NA 142 07/02/2017   K 4.7 07/02/2017   CL 104 07/02/2017   CO2 21 07/02/2017    RADIOGRAPHIC STUDIES: I have personally reviewed the radiological reports and agreed with the findings in the report.  ASSESSMENT AND PLAN:  MGUS (monoclonal gammopathy of unknown significance) Lab review 06/16/2020: Kappa: 201.9, lambda 45.1, ratio, 4.48, creatinine: 3.2, calcium 10.2, hemoglobin 11.5, albumin 3.7 Patient has a known history of diabetes and hypertension and chronic kidney disease.  Plan: We will need to rule out MGUS versus myeloma Counseling: I discussed with the patient the spectrum of disorders from MGUS to multiple myeloma. We discussed the role of plasma cells in producing immunoglobulins. We discussed structure of immunoglobulins on how they make up the heavy chains and the light chains. The light chains are Kappa and lambda. I discussed the difference between MGUS and multiple myeloma. MGUS is characterized by elevation monoclonal protein without any end organ damage. Multiple myeloma is associated with elevation monoclonal protein and end organ damage (hypercalcemia, renal dysfunction, anemia, bone lytic lesions ) along with a bone marrow showing greater than 10% plasma cells.  After much discussion  patient tells me that he went to [facility] hospital about 5 years ago he may have had a bone marrow biopsy. We have no access to his records from the [place].  Workup recommended: serum protein electrophoresis with immunofixation Repeat kappa lambda ratio  If this test does not show any M protein, then we can attribute his polyclonal increase in the light chains as simply reactive/inflammatory/infection related he may be observed with annual blood work.  If these tests are abnormal, then we will obtain a bone marrow biopsy. Return to clinic based upon these test results.    All questions were answered. The patient knows to call the clinic with any problems, questions or concerns.    Harriette Ohara, MD 07/12/20

## 2020-07-12 NOTE — Patient Instructions (Signed)
[place] at [facility] Discharge Instructions  You were seen today by Dr. Lindi Adie.  He is a hematologist or a blood specialist.  Your kidney doctor saw something abnormal in your blood work.  He wanted [place] to take a look at it closer.  We will do some additional lab work today.  We will not call you with the results.  We will see you back in 3 weeks to review all the lab work.     Thank you for choosing [place] at [facility] to provide your oncology and hematology care.  To afford each patient quality time with our provider, please arrive at least 15 minutes before your scheduled appointment time.   If you have a lab appointment with the [facility] please come in thru the Main Entrance and check in at the main information desk.  You need to re-schedule your appointment should you arrive 10 or more minutes late.  We strive to give you quality time with our providers, and arriving late affects you and other patients whose appointments are after yours.  Also, if you no show three or more times for appointments you may be dismissed from the clinic at the providers discretion.     Again, thank you for choosing [facility].  Our hope is that these requests will decrease the amount of time that you wait before being seen by our physicians.       _____________________________________________________________  Should you have questions after your visit to [facility], please contact our office at [phone] and follow the prompts.  Our office hours are 8:00 a.m. and 4:30 p.m. Monday - Friday.  Please note that voicemails left after 4:00 p.m. may not be returned until the following business day.  We are closed weekends and major holidays.  You do have access to a nurse 24-7, just call the main number to the clinic [phone] and do not press any options, hold on the line and a nurse will answer the phone.    For prescription  refill requests, have your pharmacy contact our office and allow 72 hours.    Due to Covid, you will need to wear a mask upon entering the hospital. If you do not have a mask, a mask will be given to you at the Main Entrance upon arrival. For doctor visits, patients may have 1 support person age 67 or older with them. For treatment visits, patients can not have anyone with them due to social distancing guidelines and our immunocompromised population.

## 2020-07-12 NOTE — Assessment & Plan Note (Deleted)
Free Kappa Lt Chains, S Latest Ref Range: 3.3 - 19.4 mg/L 201.9 (H)  Free Lambda Lt Chains, S Latest Ref Range: 5.7 - 26.3 mg/L 45.1 (H)  Free Kappa/Lambda Ratio Latest Ref Range: 0.26 - 1.65 4.48 (H)

## 2020-07-13 LAB — KAPPA/LAMBDA LIGHT CHAINS
Kappa free light chain: 219.7 mg/L — ABNORMAL HIGH (ref 3.3–19.4)
Kappa, lambda light chain ratio: 4.64 — ABNORMAL HIGH (ref 0.26–1.65)
Lambda free light chains: 47.4 mg/L — ABNORMAL HIGH (ref 5.7–26.3)

## 2020-07-13 LAB — PROTEIN ELECTROPHORESIS, SERUM
A/G Ratio: 1.1 (ref 0.7–1.7)
Albumin ELP: 3.6 g/dL (ref 2.9–4.4)
Alpha-1-Globulin: 0.2 g/dL (ref 0.0–0.4)
Alpha-2-Globulin: 0.8 g/dL (ref 0.4–1.0)
Beta Globulin: 1.2 g/dL (ref 0.7–1.3)
Gamma Globulin: 1.1 g/dL (ref 0.4–1.8)
Globulin, Total: 3.3 g/dL (ref 2.2–3.9)
Total Protein ELP: 6.9 g/dL (ref 6.0–8.5)

## 2020-07-25 DIAGNOSIS — E118 Type 2 diabetes mellitus with unspecified complications: Secondary | ICD-10-CM | POA: Diagnosis not present

## 2020-07-25 DIAGNOSIS — E785 Hyperlipidemia, unspecified: Secondary | ICD-10-CM | POA: Diagnosis not present

## 2020-08-06 ENCOUNTER — Emergency Department (HOSPITAL_COMMUNITY): Payer: No Typology Code available for payment source

## 2020-08-06 ENCOUNTER — Other Ambulatory Visit: Payer: Self-pay

## 2020-08-06 ENCOUNTER — Emergency Department (HOSPITAL_COMMUNITY)
Admission: EM | Admit: 2020-08-06 | Discharge: 2020-08-06 | Disposition: A | Discharge status: Home / Self Care | Payer: No Typology Code available for payment source | Attending: Emergency Medicine | Admitting: Emergency Medicine

## 2020-08-06 ENCOUNTER — Encounter (HOSPITAL_COMMUNITY): Payer: Self-pay | Admitting: Emergency Medicine

## 2020-08-06 DIAGNOSIS — R109 Unspecified abdominal pain: Secondary | ICD-10-CM | POA: Insufficient documentation

## 2020-08-06 DIAGNOSIS — E1122 Type 2 diabetes mellitus with diabetic chronic kidney disease: Secondary | ICD-10-CM | POA: Diagnosis not present

## 2020-08-06 DIAGNOSIS — E1169 Type 2 diabetes mellitus with other specified complication: Secondary | ICD-10-CM | POA: Insufficient documentation

## 2020-08-06 DIAGNOSIS — M545 Low back pain, unspecified: Secondary | ICD-10-CM | POA: Insufficient documentation

## 2020-08-06 DIAGNOSIS — Z794 Long term (current) use of insulin: Secondary | ICD-10-CM | POA: Diagnosis not present

## 2020-08-06 DIAGNOSIS — Z743 Need for continuous supervision: Secondary | ICD-10-CM | POA: Diagnosis not present

## 2020-08-06 DIAGNOSIS — R6889 Other general symptoms and signs: Secondary | ICD-10-CM | POA: Diagnosis not present

## 2020-08-06 DIAGNOSIS — Z79899 Other long term (current) drug therapy: Secondary | ICD-10-CM | POA: Diagnosis not present

## 2020-08-06 DIAGNOSIS — E1140 Type 2 diabetes mellitus with diabetic neuropathy, unspecified: Secondary | ICD-10-CM | POA: Diagnosis not present

## 2020-08-06 DIAGNOSIS — F17210 Nicotine dependence, cigarettes, uncomplicated: Secondary | ICD-10-CM | POA: Diagnosis not present

## 2020-08-06 DIAGNOSIS — W01198A Fall on same level from slipping, tripping and stumbling with subsequent striking against other object, initial encounter: Secondary | ICD-10-CM | POA: Diagnosis not present

## 2020-08-06 DIAGNOSIS — W19XXXA Unspecified fall, initial encounter: Secondary | ICD-10-CM | POA: Diagnosis not present

## 2020-08-06 DIAGNOSIS — I129 Hypertensive chronic kidney disease with stage 1 through stage 4 chronic kidney disease, or unspecified chronic kidney disease: Secondary | ICD-10-CM | POA: Diagnosis not present

## 2020-08-06 DIAGNOSIS — E785 Hyperlipidemia, unspecified: Secondary | ICD-10-CM | POA: Insufficient documentation

## 2020-08-06 DIAGNOSIS — E1139 Type 2 diabetes mellitus with other diabetic ophthalmic complication: Secondary | ICD-10-CM | POA: Insufficient documentation

## 2020-08-06 DIAGNOSIS — N1832 Chronic kidney disease, stage 3b: Secondary | ICD-10-CM | POA: Diagnosis not present

## 2020-08-06 DIAGNOSIS — Z7982 Long term (current) use of aspirin: Secondary | ICD-10-CM | POA: Insufficient documentation

## 2020-08-06 LAB — CBC WITH DIFFERENTIAL/PLATELET
Abs Immature Granulocytes: 0.01 10*3/uL (ref 0.00–0.07)
Basophils Absolute: 0 10*3/uL (ref 0.0–0.1)
Basophils Relative: 0 %
Eosinophils Absolute: 0.2 10*3/uL (ref 0.0–0.5)
Eosinophils Relative: 3 %
HCT: 37.1 % — ABNORMAL LOW (ref 39.0–52.0)
Hemoglobin: 11.7 g/dL — ABNORMAL LOW (ref 13.0–17.0)
Immature Granulocytes: 0 %
Lymphocytes Relative: 13 %
Lymphs Abs: 1.1 10*3/uL (ref 0.7–4.0)
MCH: 28.6 pg (ref 26.0–34.0)
MCHC: 31.5 g/dL (ref 30.0–36.0)
MCV: 90.7 fL (ref 80.0–100.0)
Monocytes Absolute: 0.7 10*3/uL (ref 0.1–1.0)
Monocytes Relative: 8 %
Neutro Abs: 6.3 10*3/uL (ref 1.7–7.7)
Neutrophils Relative %: 76 %
Platelets: 219 10*3/uL (ref 150–400)
RBC: 4.09 MIL/uL — ABNORMAL LOW (ref 4.22–5.81)
RDW: 13.2 % (ref 11.5–15.5)
WBC: 8.4 10*3/uL (ref 4.0–10.5)
nRBC: 0 % (ref 0.0–0.2)

## 2020-08-06 LAB — COMPREHENSIVE METABOLIC PANEL
ALT: 16 U/L (ref 0–44)
AST: 12 U/L — ABNORMAL LOW (ref 15–41)
Albumin: 3.7 g/dL (ref 3.5–5.0)
Alkaline Phosphatase: 94 U/L (ref 38–126)
Anion gap: 7 (ref 5–15)
BUN: 46 mg/dL — ABNORMAL HIGH (ref 8–23)
CO2: 24 mmol/L (ref 22–32)
Calcium: 10 mg/dL (ref 8.9–10.3)
Chloride: 108 mmol/L (ref 98–111)
Creatinine, Ser: 3.37 mg/dL — ABNORMAL HIGH (ref 0.61–1.24)
GFR, Estimated: 19 mL/min — ABNORMAL LOW (ref >60)
Glucose, Bld: 167 mg/dL — ABNORMAL HIGH (ref 70–99)
Potassium: 4.5 mmol/L (ref 3.5–5.1)
Sodium: 139 mmol/L (ref 135–145)
Total Bilirubin: 0.4 mg/dL (ref 0.3–1.2)
Total Protein: 7.3 g/dL (ref 6.5–8.1)

## 2020-08-06 NOTE — ED Notes (Signed)
See triage notes. Pt a/o. No bruising/deformities noted. rom to lower all extremities wnl. No increased pain to left lower back with bending of knees or rom or BLE.

## 2020-08-06 NOTE — ED Notes (Signed)
Contacted pt ride. No answer. Call back number provided on voicemail. primary RN aware.

## 2020-08-06 NOTE — ED Notes (Signed)
Pt resting. nad

## 2020-08-06 NOTE — ED Triage Notes (Signed)
Per EMS, pt uses walker at home and reports unsteady gait since stroke in 2021. Pt is legally blind in right eye and partially blind in left eye.  Pt reports left flank pain since fall. Pt able to ambulate to stretcher from EMS stretcher. Pt alert and answering questions appropriately. EMS denies loc or pt being on blood thinners. cbg 220.

## 2020-08-06 NOTE — ED Provider Notes (Signed)
[facility] EMERGENCY DEPARTMENT Provider Note   CSN: [id number] Arrival date & time: 08/06/20  6283     History Chief Complaint  Patient presents with  . Flank Pain    Trevor Crawford is a 70 y.o. male.  Patient states that he fell against something and hurt his left lower back.  He is complaining of left flank pain  The history is provided by the patient and medical records.  Flank Pain This is a new problem. The current episode started 2 days ago. The problem occurs constantly. The problem has not changed since onset.Pertinent negatives include no chest pain, no abdominal pain and no headaches. Nothing aggravates the symptoms. The treatment provided no relief.       Past Medical History:  Diagnosis Date  . Arthritis   . Cataract    bilateral  . Chronic kidney disease   . Depression   . Diabetes mellitus without complication ([place])   . GERD (gastroesophageal reflux disease)   . Glaucoma   . Hyperlipidemia   . Hypertension   . Legally blind in right eye, as defined in [place]   . Neuromuscular disorder ([place])   . Neuropathy   . Stroke [facility])     Patient Active Problem List   Diagnosis Date Noted  . MGUS (monoclonal gammopathy of unknown significance) 07/12/2020  . Smoker   . History of stroke   . Cerebral thrombosis with cerebral infarction 06/23/2017  . Left-sided weakness 06/22/2017  . Late effects of CVA (cerebrovascular accident) 04/11/2017  . CVA (cerebral vascular accident) ([place]) 04/04/2017  . CKD (chronic kidney disease), stage III ([place]) 04/04/2017  . Acute CVA (cerebrovascular accident) ([place]) 11/21/2016  . Glaucoma 11/21/2016  . Hyperlipidemia 11/21/2016  . GERD (gastroesophageal reflux disease) 11/21/2016  . Depression 05/07/2016  . HTN (hypertension) 05/07/2016  . Blindness 05/07/2016  . BPH (benign prostatic hyperplasia) 05/07/2016  . T2DM (type 2 diabetes mellitus) ([place]) 05/07/2016  . Renal calculi 10/06/2015  . Ureteral calculus, left 10/05/2015     Past Surgical History:  Procedure Laterality Date  . CATARACT EXTRACTION Left   . CYSTOSCOPY WITH RETROGRADE PYELOGRAM, URETEROSCOPY AND STENT PLACEMENT Left 10/06/2015   Procedure: CYSTOSCOPY WITH RETROGRADE PYELOGRAM, AND LEFT STENT PLACEMENT ;  Surgeon: Cleon Gustin, MD;  Location: [facility];  Service: Urology;  Laterality: Left;       Family History  Problem Relation Age of Onset  . Cancer Mother   . Alcoholism Father   . Cancer Father   . Cancer Other   . Cancer Sister   . Diabetes Brother   . Stroke Brother     Social History   Tobacco Use  . Smoking status: Current Every Day Smoker    Packs/day: 1.00    Years: 55.00    Pack years: 55.00    Types: Cigarettes  . Smokeless tobacco: Never Used  Substance Use Topics  . Alcohol use: No  . Drug use: No    Home Medications Prior to Admission medications   Medication Sig Start Date End Date Taking? Authorizing Provider  amLODipine (NORVASC) 2.5 MG tablet Take 1 tablet (2.5 mg total) by mouth daily. 07/02/17   Timmothy Euler, MD  aspirin EC 325 MG EC tablet Take 1 tablet (325 mg total) daily by mouth. 06/25/17   Kayleen Memos, DO  atorvastatin (LIPITOR) 80 MG tablet Take 1 tablet (80 mg total) at bedtime by mouth. 06/24/17   Kayleen Memos, DO  brimonidine (ALPHAGAN) 0.2 %  ophthalmic solution Place 1 drop 2 (two) times daily into the right eye.     [provider]  carboxymethylcellulose (REFRESH PLUS) 0.5 % SOLN Place 1 drop 4 (four) times daily into the right eye.     [provider]  carvedilol (COREG) 3.125 MG tablet Take by mouth. 06/17/20 06/17/21  [provider]  cholecalciferol (VITAMIN D) 1000 units tablet Take 1,000 Units 2 (two) times daily by mouth.    [provider]  clopidogrel (PLAVIX) 75 MG tablet Take 1 tablet (75 mg total) daily by mouth. 06/25/17   Kayleen Memos, DO  dorzolamide-timolol (COSOPT) 22.3-6.8 MG/ML ophthalmic solution Place 1 drop 2 (two) times  daily into the right eye.     [provider]  furosemide (LASIX) 40 MG tablet Take 40 mg 2 (two) times daily by mouth.     [provider]  Insulin Glargine (LANTUS SOLOSTAR) 100 UNIT/ML Solostar Pen Inject 29 Units into the skin daily. 07/02/17   Timmothy Euler, MD  latanoprost (XALATAN) 0.005 % ophthalmic solution Place 1 drop at bedtime into the right eye.    [provider]  ranitidine (ZANTAC) 150 MG tablet Take 150 mg every evening by mouth.    [provider]  simethicone (MYLICON) 80 MG chewable tablet Chew 160 mg by mouth 2 (two) times daily as needed for flatulence.    [provider]  tamsulosin (FLOMAX) 0.4 MG CAPS capsule Take 1 capsule (0.4 mg total) by mouth daily. 11/07/15   Ezequiel Essex, MD    Allergies    Patient has no known allergies.  Review of Systems   Review of Systems  Constitutional: Negative for appetite change and fatigue.  HENT: Negative for congestion, ear discharge and sinus pressure.   Eyes: Negative for discharge.  Respiratory: Negative for cough.   Cardiovascular: Negative for chest pain.  Gastrointestinal: Negative for abdominal pain and diarrhea.  Genitourinary: Positive for flank pain. Negative for frequency and hematuria.  Musculoskeletal: Negative for back pain.  Skin: Negative for rash.  Neurological: Negative for seizures and headaches.  Psychiatric/Behavioral: Negative for hallucinations.    Physical Exam Updated Vital Signs BP (!) 181/88   Pulse 76   Temp 98.3 F (36.8 C) (Oral)   Resp 20   Ht 6' (1.829 m)   Wt 93 kg   SpO2 97%   BMI 27.80 kg/m   Physical Exam Vitals and nursing note reviewed.  Constitutional:      Appearance: He is well-developed.  HENT:     Head: Normocephalic.     Nose: Nose normal.     Mouth/Throat:     Mouth: Mucous membranes are moist.  Eyes:     General: No scleral icterus.    Extraocular Movements: EOM normal.     Conjunctiva/sclera: Conjunctivae  normal.  Neck:     Thyroid: No thyromegaly.  Cardiovascular:     Rate and Rhythm: Normal rate and regular rhythm.     Heart sounds: No murmur heard. No friction rub. No gallop.   Pulmonary:     Breath sounds: No stridor. No wheezing or rales.  Chest:     Chest wall: No tenderness.  Abdominal:     General: There is no distension.     Tenderness: There is no abdominal tenderness. There is no rebound.  Musculoskeletal:        General: No edema. Normal range of motion.     Cervical back: Neck supple.     Comments:  Tender left flank  Lymphadenopathy:     Cervical: No cervical adenopathy.  Skin:    Findings: No erythema or rash.  Neurological:     Mental Status: He is alert and oriented to person, place, and time.     Motor: No abnormal muscle tone.     Coordination: Coordination normal.  Psychiatric:        Mood and Affect: Mood and affect normal.        Behavior: Behavior normal.     ED Results / Procedures / Treatments   Labs (all labs ordered are listed, but only abnormal results are displayed) Labs Reviewed  CBC WITH DIFFERENTIAL/PLATELET - Abnormal; Notable for the following components:      Result Value   RBC 4.09 (*)    Hemoglobin 11.7 (*)    HCT 37.1 (*)    All other components within normal limits  COMPREHENSIVE METABOLIC PANEL - Abnormal; Notable for the following components:   Glucose, Bld 167 (*)    BUN 46 (*)    Creatinine, Ser 3.37 (*)    AST 12 (*)    GFR, Estimated 19 (*)    All other components within normal limits    EKG None  Radiology CT ABDOMEN PELVIS WO CONTRAST  Result Date: 08/06/2020 CLINICAL DATA:  Left side abdominal pain.  Fall. EXAM: CT ABDOMEN AND PELVIS WITHOUT CONTRAST TECHNIQUE: Multidetector CT imaging of the abdomen and pelvis was performed following the standard protocol without IV contrast. COMPARISON:  11/07/2015 FINDINGS: Lower chest: Lung bases are clear. No effusions. Heart is normal size. Hepatobiliary: No focal hepatic  abnormality. Gallbladder unremarkable. Pancreas: No focal abnormality or ductal dilatation. Spleen: No focal abnormality.  Normal size. Adrenals/Urinary Tract: Renovascular calcifications in the left renal hilum. Punctate nonobstructing stone in the lower pole of the right kidney. No hydronephrosis. No renal or adrenal mass. Urinary bladder unremarkable. Stomach/Bowel: Colonic diverticulosis most pronounced in the right colon. No active diverticulitis. Normal appendix. Stomach and small bowel decompressed, unremarkable. Vascular/Lymphatic: Aortoiliac atherosclerosis. No evidence of aneurysm or adenopathy. Reproductive: No visible focal abnormality. Other: No free fluid or free air. Musculoskeletal: No acute bony abnormality. IMPRESSION: No acute findings or evidence of significant traumatic injury in the chest, abdomen or pelvis. Aortoiliac atherosclerosis. Electronically Signed   By: Rolm Baptise M.D.   On: 08/06/2020 10:15    Procedures Procedures (including critical care time)  Medications Ordered in ED Medications - No data to display  ED Course  I have reviewed the triage vital signs and the nursing notes.  Pertinent labs & imaging results that were available during my care of the patient were reviewed by me and considered in my medical decision making (see chart for details).    MDM Rules/Calculators/A&P                          Patient with contusion left flank.  CT scan negative labs show chronic kidney problems.  He will take Tylenol follow-up as needed Final Clinical Impression(s) / ED Diagnoses Final diagnoses:  Left flank pain    Rx / DC Orders ED Discharge Orders    None       Milton Ferguson, MD 08/06/20 1205

## 2020-08-06 NOTE — Discharge Instructions (Addendum)
Take Tylenol for pain and follow-up with your doctor if any problems °

## 2020-08-10 ENCOUNTER — Other Ambulatory Visit: Payer: Self-pay

## 2020-08-10 ENCOUNTER — Inpatient Hospital Stay (HOSPITAL_COMMUNITY): Payer: Medicare Other | Attending: Hematology and Oncology | Admitting: Hematology

## 2020-08-10 ENCOUNTER — Inpatient Hospital Stay (HOSPITAL_COMMUNITY): Payer: Medicare Other

## 2020-08-10 ENCOUNTER — Ambulatory Visit (HOSPITAL_COMMUNITY)
Admission: RE | Admit: 2020-08-10 | Discharge: 2020-08-10 | Disposition: A | Discharge status: Home / Self Care | Payer: No Typology Code available for payment source | Source: Ambulatory Visit | Attending: Hematology | Admitting: Hematology

## 2020-08-10 VITALS — BP 137/75 | HR 84 | Temp 97.2°F | Resp 18 | Wt 189.6 lb

## 2020-08-10 DIAGNOSIS — E1140 Type 2 diabetes mellitus with diabetic neuropathy, unspecified: Secondary | ICD-10-CM | POA: Diagnosis not present

## 2020-08-10 DIAGNOSIS — Z9181 History of falling: Secondary | ICD-10-CM | POA: Diagnosis not present

## 2020-08-10 DIAGNOSIS — Z809 Family history of malignant neoplasm, unspecified: Secondary | ICD-10-CM | POA: Diagnosis not present

## 2020-08-10 DIAGNOSIS — N189 Chronic kidney disease, unspecified: Secondary | ICD-10-CM | POA: Diagnosis not present

## 2020-08-10 DIAGNOSIS — F17210 Nicotine dependence, cigarettes, uncomplicated: Secondary | ICD-10-CM | POA: Diagnosis not present

## 2020-08-10 DIAGNOSIS — E1136 Type 2 diabetes mellitus with diabetic cataract: Secondary | ICD-10-CM | POA: Diagnosis not present

## 2020-08-10 DIAGNOSIS — Z794 Long term (current) use of insulin: Secondary | ICD-10-CM | POA: Insufficient documentation

## 2020-08-10 DIAGNOSIS — R768 Other specified abnormal immunological findings in serum: Secondary | ICD-10-CM | POA: Diagnosis not present

## 2020-08-10 DIAGNOSIS — Z993 Dependence on wheelchair: Secondary | ICD-10-CM | POA: Insufficient documentation

## 2020-08-10 DIAGNOSIS — D472 Monoclonal gammopathy: Secondary | ICD-10-CM | POA: Insufficient documentation

## 2020-08-10 LAB — LACTATE DEHYDROGENASE: LDH: 116 U/L (ref 98–192)

## 2020-08-10 NOTE — Patient Instructions (Signed)
[place] at [facility] Discharge Instructions  You were seen today by Dr. Delton Coombes. He went over your recent results; the current blood work suggests that you have a blood condition called MGUS (monoclonal gammopathy of unspecified significance), which is a pre-cancerous condition for myeloma. You had more blood work done for further analysis. You will be scheduled for x-rays of your long bones. Drink plenty of fluids to keep your kidneys flushed. Dr. Delton Coombes will call you by telephone in 1 week for follow up.   Thank you for choosing [place] at [facility] to provide your oncology and hematology care.  To afford each patient quality time with our provider, please arrive at least 15 minutes before your scheduled appointment time.   If you have a lab appointment with the [facility] please come in thru the Main Entrance and check in at the main information desk  You need to re-schedule your appointment should you arrive 10 or more minutes late.  We strive to give you quality time with our providers, and arriving late affects you and other patients whose appointments are after yours.  Also, if you no show three or more times for appointments you may be dismissed from the clinic at the providers discretion.     Again, thank you for choosing [facility].  Our hope is that these requests will decrease the amount of time that you wait before being seen by our physicians.       _____________________________________________________________  Should you have questions after your visit to [facility], please contact our office at [phone] between the hours of 8:00 a.m. and 4:30 p.m.  Voicemails left after 4:00 p.m. will not be returned until the following business day.  For prescription refill requests, have your pharmacy contact our office and allow 72 hours.    Cancer Center Support Programs:   > Cancer Support Group   2nd Tuesday of the month 1pm-2pm, Journey Room

## 2020-08-10 NOTE — Progress Notes (Signed)
[address], [place] [address]   CLINIC:  Medical Oncology/Hematology  PCP:  Trevor Fire, MD [place] / [place] [address]  [phone]  REASON FOR VISIT:  Follow-up for MGUS  PRIOR THERAPY: None  CURRENT THERAPY: Under work-up  INTERVAL HISTORY:  Mr. Trevor Crawford, a 70 y.o. male, returns for routine follow-up for his MGUS. [name] was last seen by Dr. Nicholas Crawford on 07/12/2020.  Today he is accompanied by his wife and he reports feeling okay. He has long-standing DM which has caused neuropathy in his feet and blind in right eye. He ambulates with a walker but has fallen several times due to being unsteadiness.   He has extensive family history of cancer in his family: multiple sisters had some kind of cancer; his maternal aunt had some kind of cancer. He used to work in [employer] and he served in [employer] during the [place] War, though he was never deployed to [place] and stationed in [place]. He continues smoking 1 PPD for 50 years.   REVIEW OF SYSTEMS:  Review of Systems  Constitutional: Positive for appetite change (50%) and fatigue (50%).  Eyes: Positive for eye problems (blind in R eye).  Cardiovascular: Positive for leg swelling.  Neurological: Positive for numbness (numbness in feet).  All other systems reviewed and are negative.   PAST MEDICAL/SURGICAL HISTORY:  Past Medical History:  Diagnosis Date  . Arthritis   . Cataract    bilateral  . Chronic kidney disease   . Depression   . Diabetes mellitus without complication ([place])   . GERD (gastroesophageal reflux disease)   . Glaucoma   . Hyperlipidemia   . Hypertension   . Legally blind in right eye, as defined in [place]   . Neuromuscular disorder ([place])   . Neuropathy   . Stroke [facility])    Past Surgical History:  Procedure Laterality Date  . CATARACT EXTRACTION Left   . CYSTOSCOPY WITH RETROGRADE PYELOGRAM, URETEROSCOPY AND STENT PLACEMENT Left  10/06/2015   Procedure: CYSTOSCOPY WITH RETROGRADE PYELOGRAM, AND LEFT STENT PLACEMENT ;  Surgeon: Trevor Gustin, MD;  Location: [facility];  Service: Urology;  Laterality: Left;    SOCIAL HISTORY:  Social History   Socioeconomic History  . Marital status: Married    Spouse name: Not on file  . Number of children: Not on file  . Years of education: Not on file  . Highest education level: Not on file  Occupational History  . Not on file  Tobacco Use  . Smoking status: Current Every Day Smoker    Packs/day: 1.00    Years: 55.00    Pack years: 55.00    Types: Cigarettes  . Smokeless tobacco: Never Used  Substance and Sexual Activity  . Alcohol use: No  . Drug use: No  . Sexual activity: Never    Birth control/protection: None  Other Topics Concern  . Not on file  Social History Narrative  . Not on file   Social Determinants of Health   Financial Resource Strain: Medium Risk  . Difficulty of Paying Living Expenses: Somewhat hard  Food Insecurity: No Food Insecurity  . Worried About Charity fundraiser in the Last Year: Never true  . Ran Out of Food in the Last Year: Never true  Transportation Needs: No Transportation Needs  . Lack of Transportation (Medical): No  . Lack of Transportation (Non-Medical): No  Physical Activity: Inactive  . Days  of Exercise per Week: 0 days  . Minutes of Exercise per Session: 0 min  Stress: No Stress Concern Present  . Feeling of Stress : Not at all  Social Connections: Socially Isolated  . Frequency of Communication with Friends and Family: Never  . Frequency of Social Gatherings with Friends and Family: Never  . Attends Religious Services: Never  . Active Member of Clubs or Organizations: No  . Attends Archivist Meetings: Never  . Marital Status: Married  Human resources officer Violence: Not At Risk  . Fear of Current or Ex-Partner: No  . Emotionally Abused: No  . Physically Abused: No  . Sexually Abused: No    FAMILY  HISTORY:  Family History  Problem Relation Age of Onset  . Cancer Mother   . Alcoholism Father   . Cancer Father   . Cancer Other   . Cancer Sister   . Diabetes Brother   . Stroke Brother     CURRENT MEDICATIONS:  Current Outpatient Medications  Medication Sig Dispense Refill  . amLODipine (NORVASC) 2.5 MG tablet Take 1 tablet (2.5 mg total) by mouth daily. 90 tablet 3  . aspirin EC 325 MG EC tablet Take 1 tablet (325 mg total) daily by mouth. 30 tablet 0  . atorvastatin (LIPITOR) 80 MG tablet Take 1 tablet (80 mg total) at bedtime by mouth. 30 tablet 0  . brimonidine (ALPHAGAN) 0.2 % ophthalmic solution Place 1 drop 2 (two) times daily into the right eye.     . carboxymethylcellulose (REFRESH PLUS) 0.5 % SOLN Place 1 drop 4 (four) times daily into the right eye.     . carvedilol (COREG) 3.125 MG tablet Take by mouth.    . cholecalciferol (VITAMIN D) 1000 units tablet Take 1,000 Units 2 (two) times daily by mouth.    . clopidogrel (PLAVIX) 75 MG tablet Take 1 tablet (75 mg total) daily by mouth. 30 tablet 0  . dorzolamide-timolol (COSOPT) 22.3-6.8 MG/ML ophthalmic solution Place 1 drop 2 (two) times daily into the right eye.     . furosemide (LASIX) 40 MG tablet Take 40 mg 2 (two) times daily by mouth.     . Insulin Glargine (LANTUS SOLOSTAR) 100 UNIT/ML Solostar Pen Inject 29 Units into the skin daily. 15 mL 11  . latanoprost (XALATAN) 0.005 % ophthalmic solution Place 1 drop at bedtime into the right eye.    . ranitidine (ZANTAC) 150 MG tablet Take 150 mg every evening by mouth.    . simethicone (MYLICON) 80 MG chewable tablet Chew 160 mg by mouth 2 (two) times daily as needed for flatulence.    . tamsulosin (FLOMAX) 0.4 MG CAPS capsule Take 1 capsule (0.4 mg total) by mouth daily. 10 capsule 0   No current facility-administered medications for this visit.    ALLERGIES:  No Known Allergies  PHYSICAL EXAM:  Performance status (ECOG): 3 - Symptomatic, >50% confined to  bed  Vitals:   08/10/20 1405  BP: 137/75  Pulse: 84  Resp: 18  Temp: (!) 97.2 F (36.2 C)  SpO2: 100%   Wt Readings from Last 3 Encounters:  08/10/20 189 lb 9.6 oz (86 kg)  08/06/20 205 lb (93 kg)  07/12/20 202 lb 3.2 oz (91.7 kg)   Physical Exam Vitals reviewed.  Constitutional:      Appearance: Normal appearance.     Comments: Wheelchair bound  Cardiovascular:     Rate and Rhythm: Normal rate and regular rhythm.     Pulses: Normal  pulses.     Heart sounds: Normal heart sounds.  Pulmonary:     Effort: Pulmonary effort is normal.     Breath sounds: Normal breath sounds.  Neurological:     General: No focal deficit present.     Mental Status: He is alert and oriented to person, place, and time.  Psychiatric:        Mood and Affect: Mood normal.        Behavior: Behavior normal.     LABORATORY DATA:  I have reviewed the labs as listed.  CBC Latest Ref Rng & Units 08/06/2020 07/02/2017 06/24/2017  WBC 4.0 - 10.5 K/uL 8.4 9.2 7.3  Hemoglobin 13.0 - 17.0 g/dL 11.7(L) 12.8(L) 12.4(L)  Hematocrit 39.0 - 52.0 % 37.1(L) 38.6 37.5(L)  Platelets 150 - 400 K/uL 219 237 170   CMP Latest Ref Rng & Units 08/06/2020 07/02/2017 06/24/2017  Glucose 70 - 99 mg/dL 167(H) 110(H) 62(L)  BUN 8 - 23 mg/dL 46(H) 41(H) 30(H)  Creatinine 0.61 - 1.24 mg/dL 3.37(H) 2.37(H) 1.76(H)  Sodium 135 - 145 mmol/L 139 142 137  Potassium 3.5 - 5.1 mmol/L 4.5 4.7 3.9  Chloride 98 - 111 mmol/L 108 104 109  CO2 22 - 32 mmol/L 24 21 20(L)  Calcium 8.9 - 10.3 mg/dL 10.0 9.9 9.0  Total Protein 6.5 - 8.1 g/dL 7.3 - -  Total Bilirubin 0.3 - 1.2 mg/dL 0.4 - -  Alkaline Phos 38 - 126 U/L 94 - -  AST 15 - 41 U/L 12(L) - -  ALT 0 - 44 U/L 16 - -      Component Value Date/Time   RBC 4.09 (L) 08/06/2020 0853   MCV 90.7 08/06/2020 0853   MCV 89 07/02/2017 1141   MCH 28.6 08/06/2020 0853   MCHC 31.5 08/06/2020 0853   RDW 13.2 08/06/2020 0853   RDW 14.5 07/02/2017 1141   LYMPHSABS 1.1 08/06/2020 0853    LYMPHSABS 1.6 07/02/2017 1141   MONOABS 0.7 08/06/2020 0853   EOSABS 0.2 08/06/2020 0853   EOSABS 0.3 07/02/2017 1141   BASOSABS 0.0 08/06/2020 0853   BASOSABS 0.0 07/02/2017 1141   Lab Results  Component Value Date   TOTALPROTELP 6.9 07/12/2020   ALBUMINELP 3.6 07/12/2020   A1GS 0.2 07/12/2020   A2GS 0.8 07/12/2020   BETS 1.2 07/12/2020   GAMS 1.1 07/12/2020   MSPIKE Not Observed 07/12/2020   SPEI Comment 07/12/2020    Lab Results  Component Value Date   KPAFRELGTCHN 219.7 (H) 07/12/2020   LAMBDASER 47.4 (H) 07/12/2020   KAPLAMBRATIO 4.64 (H) 07/12/2020    DIAGNOSTIC IMAGING:  I have independently reviewed the scans and discussed with the patient. CT ABDOMEN PELVIS WO CONTRAST  Result Date: 08/06/2020 CLINICAL DATA:  Left side abdominal pain.  Fall. EXAM: CT ABDOMEN AND PELVIS WITHOUT CONTRAST TECHNIQUE: Multidetector CT imaging of the abdomen and pelvis was performed following the standard protocol without IV contrast. COMPARISON:  11/07/2015 FINDINGS: Lower chest: Lung bases are clear. No effusions. Heart is normal size. Hepatobiliary: No focal hepatic abnormality. Gallbladder unremarkable. Pancreas: No focal abnormality or ductal dilatation. Spleen: No focal abnormality.  Normal size. Adrenals/Urinary Tract: Renovascular calcifications in the left renal hilum. Punctate nonobstructing stone in the lower pole of the right kidney. No hydronephrosis. No renal or adrenal mass. Urinary bladder unremarkable. Stomach/Bowel: Colonic diverticulosis most pronounced in the right colon. No active diverticulitis. Normal appendix. Stomach and small bowel decompressed, unremarkable. Vascular/Lymphatic: Aortoiliac atherosclerosis. No evidence of aneurysm or adenopathy. Reproductive: No  visible focal abnormality. Other: No free fluid or free air. Musculoskeletal: No acute bony abnormality. IMPRESSION: No acute findings or evidence of significant traumatic injury in the chest, abdomen or pelvis.  Aortoiliac atherosclerosis. Electronically Signed   By: Rolm Baptise M.D.   On: 08/06/2020 10:15     ASSESSMENT:  1.  MGUS: -Patient evaluated at the request of Dr. Theador Hawthorne for monoclonal gammopathy.  Urine immunofixation showed faint monoclonal free kappa light chains. -Labs on 07/12/2020 showed SPEP was negative. -Kappa light chains to 19, lambda light chain 47, ratio 4.64.  2.  Social/family history: -He is a current active smoker, 1 pack/day for 37 years. -2 sisters died of cancer.  Maternal aunt also had cancer.  Patient does not know the types.  3.  CKD: -Follows up with Dr. Theador Hawthorne. -CKD thought to be predominantly secondary to poorly controlled diabetes. -Renal ultrasound on 06/16/2020 shows increased renal echogenicity bilaterally indicative of underlying medical renal disease.  Renal cortical thickness normal bilaterally.  Minimally complex cyst mid left kidney 1.0 x 1.1 x 1.2 cm.   PLAN:  1.  MGUS: -I have discussed normal pathophysiology of monoclonal gammopathy. -His SPEP is negative.  We will check serum immunofixation and LDH and beta-2 microglobulin levels today. -We will also recommend skeletal survey. -He has elevated free light chain ratio, higher than what is expected for degree of chronic kidney disease.  Orders placed this encounter:  Orders Placed This Encounter  Procedures  . DG Bone Survey Met  . Lactate dehydrogenase  . Beta 2 microglobulin, serum  . Immunofixation electrophoresis     Derek Jack, MD [place] [phone]   I, Milinda Antis, am acting as a scribe for Dr. Sanda Linger.  I, Derek Jack MD, have reviewed the above documentation for accuracy and completeness, and I agree with the above.

## 2020-08-11 LAB — BETA 2 MICROGLOBULIN, SERUM: Beta-2 Microglobulin: 4.5 mg/L — ABNORMAL HIGH (ref 0.6–2.4)

## 2020-08-12 LAB — IMMUNOFIXATION ELECTROPHORESIS
IgA: 412 mg/dL (ref 61–437)
IgG (Immunoglobin G), Serum: 1034 mg/dL (ref 603–1613)
IgM (Immunoglobulin M), Srm: 65 mg/dL (ref 20–172)
Total Protein ELP: 6.9 g/dL (ref 6.0–8.5)

## 2020-08-17 ENCOUNTER — Other Ambulatory Visit: Payer: Self-pay

## 2020-08-17 ENCOUNTER — Inpatient Hospital Stay (HOSPITAL_BASED_OUTPATIENT_CLINIC_OR_DEPARTMENT_OTHER): Payer: Medicare Other | Admitting: Hematology

## 2020-08-17 VITALS — BP 140/71 | HR 80 | Temp 97.0°F | Resp 17

## 2020-08-17 DIAGNOSIS — E1136 Type 2 diabetes mellitus with diabetic cataract: Secondary | ICD-10-CM | POA: Diagnosis not present

## 2020-08-17 DIAGNOSIS — D472 Monoclonal gammopathy: Secondary | ICD-10-CM

## 2020-08-17 DIAGNOSIS — Z809 Family history of malignant neoplasm, unspecified: Secondary | ICD-10-CM | POA: Diagnosis not present

## 2020-08-17 DIAGNOSIS — Z794 Long term (current) use of insulin: Secondary | ICD-10-CM | POA: Diagnosis not present

## 2020-08-17 DIAGNOSIS — R768 Other specified abnormal immunological findings in serum: Secondary | ICD-10-CM | POA: Diagnosis not present

## 2020-08-17 DIAGNOSIS — F17210 Nicotine dependence, cigarettes, uncomplicated: Secondary | ICD-10-CM | POA: Diagnosis not present

## 2020-08-17 DIAGNOSIS — Z993 Dependence on wheelchair: Secondary | ICD-10-CM | POA: Diagnosis not present

## 2020-08-17 DIAGNOSIS — Z9181 History of falling: Secondary | ICD-10-CM | POA: Diagnosis not present

## 2020-08-17 DIAGNOSIS — E1140 Type 2 diabetes mellitus with diabetic neuropathy, unspecified: Secondary | ICD-10-CM | POA: Diagnosis not present

## 2020-08-17 DIAGNOSIS — N189 Chronic kidney disease, unspecified: Secondary | ICD-10-CM | POA: Diagnosis not present

## 2020-08-17 NOTE — Progress Notes (Signed)
[address], [place] [address]   CLINIC:  Medical Oncology/Hematology  PCP:  Rosita Fire, MD [place] / [place] [address]  [phone]  REASON FOR VISIT:  Follow-up for MGUS  PRIOR THERAPY: None  CURRENT THERAPY: Under work-up  INTERVAL HISTORY:  Mr. Trevor Crawford, a 70 y.o. male, returns for routine follow-up for his MGUS. Trevor Crawford was last seen on 08/10/2020.  Today he is accompanied by his wife and he reports feeling okay. He denies having any changes since his last visit.   REVIEW OF SYSTEMS:  Review of Systems  Constitutional: Positive for appetite change (50%) and fatigue (50%).  Musculoskeletal: Negative for arthralgias and myalgias.  All other systems reviewed and are negative.   PAST MEDICAL/SURGICAL HISTORY:  Past Medical History:  Diagnosis Date  . Arthritis   . Cataract    bilateral  . Chronic kidney disease   . Depression   . Diabetes mellitus without complication ([place])   . GERD (gastroesophageal reflux disease)   . Glaucoma   . Hyperlipidemia   . Hypertension   . Legally blind in right eye, as defined in [place]   . Neuromuscular disorder ([place])   . Neuropathy   . Stroke [facility])    Past Surgical History:  Procedure Laterality Date  . CATARACT EXTRACTION Left   . CYSTOSCOPY WITH RETROGRADE PYELOGRAM, URETEROSCOPY AND STENT PLACEMENT Left 10/06/2015   Procedure: CYSTOSCOPY WITH RETROGRADE PYELOGRAM, AND LEFT STENT PLACEMENT ;  Surgeon: Cleon Gustin, MD;  Location: [facility];  Service: Urology;  Laterality: Left;    SOCIAL HISTORY:  Social History   Socioeconomic History  . Marital status: Married    Spouse name: Not on file  . Number of children: Not on file  . Years of education: Not on file  . Highest education level: Not on file  Occupational History  . Not on file  Tobacco Use  . Smoking status: Current Every Day Smoker    Packs/day: 1.00    Years: 55.00    Pack years: 55.00     Types: Cigarettes  . Smokeless tobacco: Never Used  Substance and Sexual Activity  . Alcohol use: No  . Drug use: No  . Sexual activity: Never    Birth control/protection: None  Other Topics Concern  . Not on file  Social History Narrative  . Not on file   Social Determinants of Health   Financial Resource Strain: Medium Risk  . Difficulty of Paying Living Expenses: Somewhat hard  Food Insecurity: No Food Insecurity  . Worried About Charity fundraiser in the Last Year: Never true  . Ran Out of Food in the Last Year: Never true  Transportation Needs: No Transportation Needs  . Lack of Transportation (Medical): No  . Lack of Transportation (Non-Medical): No  Physical Activity: Inactive  . Days of Exercise per Week: 0 days  . Minutes of Exercise per Session: 0 min  Stress: No Stress Concern Present  . Feeling of Stress : Not at all  Social Connections: Socially Isolated  . Frequency of Communication with Friends and Family: Never  . Frequency of Social Gatherings with Friends and Family: Never  . Attends Religious Services: Never  . Active Member of Clubs or Organizations: No  . Attends Archivist Meetings: Never  . Marital Status: Married  Human resources officer Violence: Not At Risk  . Fear of Current or Ex-Partner: No  . Emotionally Abused: No  .  Physically Abused: No  . Sexually Abused: No    FAMILY HISTORY:  Family History  Problem Relation Age of Onset  . Cancer Mother   . Alcoholism Father   . Cancer Father   . Cancer Other   . Cancer Sister   . Diabetes Brother   . Stroke Brother     CURRENT MEDICATIONS:  Current Outpatient Medications  Medication Sig Dispense Refill  . amLODipine (NORVASC) 2.5 MG tablet Take 1 tablet (2.5 mg total) by mouth daily. 90 tablet 3  . amLODipine (NORVASC) 5 MG tablet Take 5 mg by mouth daily.    Marland Kitchen aspirin EC 325 MG EC tablet Take 1 tablet (325 mg total) daily by mouth. 30 tablet 0  . atorvastatin (LIPITOR) 80 MG tablet  Take 1 tablet (80 mg total) at bedtime by mouth. 30 tablet 0  . brimonidine (ALPHAGAN) 0.2 % ophthalmic solution Place 1 drop 2 (two) times daily into the right eye.     . carboxymethylcellulose (REFRESH PLUS) 0.5 % SOLN Place 1 drop 4 (four) times daily into the right eye.     . carvedilol (COREG) 3.125 MG tablet Take by mouth.    . cholecalciferol (VITAMIN D) 1000 units tablet Take 1,000 Units 2 (two) times daily by mouth.    . clopidogrel (PLAVIX) 75 MG tablet Take 1 tablet (75 mg total) daily by mouth. 30 tablet 0  . dorzolamide-timolol (COSOPT) 22.3-6.8 MG/ML ophthalmic solution Place 1 drop 2 (two) times daily into the right eye.     . furosemide (LASIX) 40 MG tablet Take 40 mg 2 (two) times daily by mouth.     . Insulin Glargine (LANTUS SOLOSTAR) 100 UNIT/ML Solostar Pen Inject 29 Units into the skin daily. 15 mL 11  . latanoprost (XALATAN) 0.005 % ophthalmic solution Place 1 drop at bedtime into the right eye.    . ranitidine (ZANTAC) 150 MG tablet Take 150 mg every evening by mouth.    . simethicone (MYLICON) 80 MG chewable tablet Chew 160 mg by mouth 2 (two) times daily as needed for flatulence.    . tamsulosin (FLOMAX) 0.4 MG CAPS capsule Take 1 capsule (0.4 mg total) by mouth daily. 10 capsule 0   No current facility-administered medications for this visit.    ALLERGIES:  No Known Allergies  PHYSICAL EXAM:  Performance status (ECOG): 3 - Symptomatic, >50% confined to bed  Vitals:   08/17/20 1606  BP: 140/71  Pulse: 80  Resp: 17  Temp: (!) 97 F (36.1 C)  SpO2: 99%   Wt Readings from Last 3 Encounters:  08/10/20 189 lb 9.6 oz (86 kg)  08/06/20 205 lb (93 kg)  07/12/20 202 lb 3.2 oz (91.7 kg)   Physical Exam Vitals reviewed.  Constitutional:      Appearance: Normal appearance.     Comments: Wheelchair bound  Neurological:     General: No focal deficit present.     Mental Status: He is alert and oriented to person, place, and time.  Psychiatric:        Mood and  Affect: Mood normal.        Behavior: Behavior normal.     LABORATORY DATA:  I have reviewed the labs as listed.  CBC Latest Ref Rng & Units 08/06/2020 07/02/2017 06/24/2017  WBC 4.0 - 10.5 K/uL 8.4 9.2 7.3  Hemoglobin 13.0 - 17.0 g/dL 11.7(L) 12.8(L) 12.4(L)  Hematocrit 39.0 - 52.0 % 37.1(L) 38.6 37.5(L)  Platelets 150 - 400 K/uL 219 237 170  CMP Latest Ref Rng & Units 08/06/2020 07/02/2017 06/24/2017  Glucose 70 - 99 mg/dL 167(H) 110(H) 62(L)  BUN 8 - 23 mg/dL 46(H) 41(H) 30(H)  Creatinine 0.61 - 1.24 mg/dL 3.37(H) 2.37(H) 1.76(H)  Sodium 135 - 145 mmol/L 139 142 137  Potassium 3.5 - 5.1 mmol/L 4.5 4.7 3.9  Chloride 98 - 111 mmol/L 108 104 109  CO2 22 - 32 mmol/L 24 21 20(L)  Calcium 8.9 - 10.3 mg/dL 10.0 9.9 9.0  Total Protein 6.5 - 8.1 g/dL 7.3 - -  Total Bilirubin 0.3 - 1.2 mg/dL 0.4 - -  Alkaline Phos 38 - 126 U/L 94 - -  AST 15 - 41 U/L 12(L) - -  ALT 0 - 44 U/L 16 - -      Component Value Date/Time   RBC 4.09 (L) 08/06/2020 0853   MCV 90.7 08/06/2020 0853   MCV 89 07/02/2017 1141   MCH 28.6 08/06/2020 0853   MCHC 31.5 08/06/2020 0853   RDW 13.2 08/06/2020 0853   RDW 14.5 07/02/2017 1141   LYMPHSABS 1.1 08/06/2020 0853   LYMPHSABS 1.6 07/02/2017 1141   MONOABS 0.7 08/06/2020 0853   EOSABS 0.2 08/06/2020 0853   EOSABS 0.3 07/02/2017 1141   BASOSABS 0.0 08/06/2020 0853   BASOSABS 0.0 07/02/2017 1141   Lab Results  Component Value Date   LDH 116 08/10/2020   Lab Results  Component Value Date   TOTALPROTELP 6.9 08/10/2020   ALBUMINELP 3.6 07/12/2020   A1GS 0.2 07/12/2020   A2GS 0.8 07/12/2020   BETS 1.2 07/12/2020   GAMS 1.1 07/12/2020   MSPIKE Not Observed 07/12/2020   SPEI Comment 07/12/2020    Lab Results  Component Value Date   KPAFRELGTCHN 219.7 (H) 07/12/2020   LAMBDASER 47.4 (H) 07/12/2020   KAPLAMBRATIO 4.64 (H) 07/12/2020    DIAGNOSTIC IMAGING:  I have independently reviewed the scans and discussed with the patient. CT ABDOMEN PELVIS WO  CONTRAST  Result Date: 08/06/2020 CLINICAL DATA:  Left side abdominal pain.  Fall. EXAM: CT ABDOMEN AND PELVIS WITHOUT CONTRAST TECHNIQUE: Multidetector CT imaging of the abdomen and pelvis was performed following the standard protocol without IV contrast. COMPARISON:  11/07/2015 FINDINGS: Lower chest: Lung bases are clear. No effusions. Heart is normal size. Hepatobiliary: No focal hepatic abnormality. Gallbladder unremarkable. Pancreas: No focal abnormality or ductal dilatation. Spleen: No focal abnormality.  Normal size. Adrenals/Urinary Tract: Renovascular calcifications in the left renal hilum. Punctate nonobstructing stone in the lower pole of the right kidney. No hydronephrosis. No renal or adrenal mass. Urinary bladder unremarkable. Stomach/Bowel: Colonic diverticulosis most pronounced in the right colon. No active diverticulitis. Normal appendix. Stomach and small bowel decompressed, unremarkable. Vascular/Lymphatic: Aortoiliac atherosclerosis. No evidence of aneurysm or adenopathy. Reproductive: No visible focal abnormality. Other: No free fluid or free air. Musculoskeletal: No acute bony abnormality. IMPRESSION: No acute findings or evidence of significant traumatic injury in the chest, abdomen or pelvis. Aortoiliac atherosclerosis. Electronically Signed   By: Rolm Baptise M.D.   On: 08/06/2020 10:15   DG Bone Survey Met  Result Date: 08/10/2020 CLINICAL DATA:  70 year old male with MGUS. Metastatic survey. EXAM: METASTATIC BONE SURVEY COMPARISON:  None. FINDINGS: Faint lucent lesions noted in the frontal calvarium. No other bone lesions identified. The lungs are clear. There is no pleural effusion pneumothorax. The cardiac silhouette is within limits. Atherosclerotic calcification of the aorta. No acute osseous pathology. IMPRESSION: Faint lucent lesions in the frontal calvarium. Electronically Signed   By: Laren Everts.D.  On: 08/10/2020 16:51     ASSESSMENT:  1.  MGUS: -Patient  evaluated at the request of Dr. Theador Hawthorne for monoclonal gammopathy.  Urine immunofixation showed faint monoclonal free kappa light chains. -Labs on 07/12/2020 showed SPEP was negative. -Kappa light chains to 19, lambda light chain 47, ratio 4.64. - Labs on 08/10/2020 shows immunofixation polyclonal.  LDH normal.  Beta-2 microglobulin 4.5. - Skeletal survey on 08/10/2020 shows faint lucent lesions in the frontal calvarium.  No other lytic lesions.  2.  Social/family history: -He is a current active smoker, 1 pack/day for 69 years. -2 sisters died of cancer.  Maternal aunt also had cancer.  Patient does not know the types.  3.  CKD: -Follows up with Dr. Theador Hawthorne. -CKD thought to be predominantly secondary to poorly controlled diabetes. -Renal ultrasound on 06/16/2020 shows increased renal echogenicity bilaterally indicative of underlying medical renal disease.  Renal cortical thickness normal bilaterally.  Minimally complex cyst mid left kidney 1.0 x 1.1 x 1.2 cm.   PLAN:  1. Elevated free light chains and ratio: -Discussed the findings on the x-ray of the skull. - Unlikely myeloma lesions.  Discussed the results of serum immunofixation and LDH. - We will repeat labs in 4 months along with lateral view of the skull to follow-up on the lytic lesion.  If everything stable, we will switch him to 23-monthvisits at that point.  Orders placed this encounter:  No orders of the defined types were placed in this encounter.    SDerek Jack MD [id number] [phone]  I, DMilinda Antis am acting as a scribe for Dr. SSanda Linger  I, SDerek JackMD, have reviewed the above documentation for accuracy and completeness, and I agree with the above.

## 2020-08-17 NOTE — Patient Instructions (Addendum)
[place] at [facility] Discharge Instructions  You were seen today by Dr. Delton Coombes. He went over your recent results and scans. Your condition is called MGUS (monoclonal gammopathy of undetermined significance) which is a precursor to the blood cancer called myeloma. You will be scheduled for an x-ray of your skull before your next visit. Dr. Delton Coombes will see you back in 4 months for labs and follow up.   Thank you for choosing [place] at [facility] to provide your oncology and hematology care.  To afford each patient quality time with our provider, please arrive at least 15 minutes before your scheduled appointment time.   If you have a lab appointment with the [facility] please come in thru the Main Entrance and check in at the main information desk  You need to re-schedule your appointment should you arrive 10 or more minutes late.  We strive to give you quality time with our providers, and arriving late affects you and other patients whose appointments are after yours.  Also, if you no show three or more times for appointments you may be dismissed from the clinic at the providers discretion.     Again, thank you for choosing [facility].  Our hope is that these requests will decrease the amount of time that you wait before being seen by our physicians.       _____________________________________________________________  Should you have questions after your visit to [facility], please contact our office at [phone] between the hours of 8:00 a.m. and 4:30 p.m.  Voicemails left after 4:00 p.m. will not be returned until the following business day.  For prescription refill requests, have your pharmacy contact our office and allow 72 hours.    Cancer Center Support Programs:   > Cancer Support Group  2nd Tuesday of the month 1pm-2pm, Journey Room

## 2020-08-18 DIAGNOSIS — I129 Hypertensive chronic kidney disease with stage 1 through stage 4 chronic kidney disease, or unspecified chronic kidney disease: Secondary | ICD-10-CM | POA: Diagnosis not present

## 2020-08-18 DIAGNOSIS — N189 Chronic kidney disease, unspecified: Secondary | ICD-10-CM | POA: Diagnosis not present

## 2020-08-18 DIAGNOSIS — R809 Proteinuria, unspecified: Secondary | ICD-10-CM | POA: Diagnosis not present

## 2020-08-18 DIAGNOSIS — N281 Cyst of kidney, acquired: Secondary | ICD-10-CM | POA: Diagnosis not present

## 2020-08-18 DIAGNOSIS — E1122 Type 2 diabetes mellitus with diabetic chronic kidney disease: Secondary | ICD-10-CM | POA: Diagnosis not present

## 2020-08-18 DIAGNOSIS — E1129 Type 2 diabetes mellitus with other diabetic kidney complication: Secondary | ICD-10-CM | POA: Diagnosis not present

## 2020-08-18 DIAGNOSIS — N170 Acute kidney failure with tubular necrosis: Secondary | ICD-10-CM | POA: Diagnosis not present

## 2020-08-18 DIAGNOSIS — D472 Monoclonal gammopathy: Secondary | ICD-10-CM | POA: Diagnosis not present

## 2020-08-18 DIAGNOSIS — D631 Anemia in chronic kidney disease: Secondary | ICD-10-CM | POA: Diagnosis not present

## 2020-08-25 DIAGNOSIS — N184 Chronic kidney disease, stage 4 (severe): Secondary | ICD-10-CM | POA: Diagnosis not present

## 2020-08-25 DIAGNOSIS — E785 Hyperlipidemia, unspecified: Secondary | ICD-10-CM | POA: Diagnosis not present

## 2020-09-06 DIAGNOSIS — H4720 Unspecified optic atrophy: Secondary | ICD-10-CM | POA: Diagnosis not present

## 2020-09-06 DIAGNOSIS — H1711 Central corneal opacity, right eye: Secondary | ICD-10-CM | POA: Diagnosis not present

## 2020-09-06 DIAGNOSIS — H401134 Primary open-angle glaucoma, bilateral, indeterminate stage: Secondary | ICD-10-CM | POA: Diagnosis not present

## 2020-09-06 DIAGNOSIS — Z961 Presence of intraocular lens: Secondary | ICD-10-CM | POA: Diagnosis not present

## 2020-09-06 DIAGNOSIS — E119 Type 2 diabetes mellitus without complications: Secondary | ICD-10-CM | POA: Diagnosis not present

## 2020-09-25 DIAGNOSIS — E118 Type 2 diabetes mellitus with unspecified complications: Secondary | ICD-10-CM | POA: Diagnosis not present

## 2020-09-25 DIAGNOSIS — I10 Essential (primary) hypertension: Secondary | ICD-10-CM | POA: Diagnosis not present

## 2020-10-19 DIAGNOSIS — N189 Chronic kidney disease, unspecified: Secondary | ICD-10-CM | POA: Diagnosis not present

## 2020-10-19 DIAGNOSIS — E1122 Type 2 diabetes mellitus with diabetic chronic kidney disease: Secondary | ICD-10-CM | POA: Diagnosis not present

## 2020-10-19 DIAGNOSIS — R809 Proteinuria, unspecified: Secondary | ICD-10-CM | POA: Diagnosis not present

## 2020-10-19 DIAGNOSIS — E1129 Type 2 diabetes mellitus with other diabetic kidney complication: Secondary | ICD-10-CM | POA: Diagnosis not present

## 2020-10-19 DIAGNOSIS — N170 Acute kidney failure with tubular necrosis: Secondary | ICD-10-CM | POA: Diagnosis not present

## 2020-10-23 DIAGNOSIS — E118 Type 2 diabetes mellitus with unspecified complications: Secondary | ICD-10-CM | POA: Diagnosis not present

## 2020-10-23 DIAGNOSIS — N184 Chronic kidney disease, stage 4 (severe): Secondary | ICD-10-CM | POA: Diagnosis not present

## 2020-11-03 ENCOUNTER — Other Ambulatory Visit: Payer: Self-pay | Admitting: Nephrology

## 2020-11-03 ENCOUNTER — Other Ambulatory Visit (HOSPITAL_COMMUNITY): Payer: Self-pay | Admitting: Nephrology

## 2020-11-03 DIAGNOSIS — D631 Anemia in chronic kidney disease: Secondary | ICD-10-CM | POA: Diagnosis not present

## 2020-11-03 DIAGNOSIS — N170 Acute kidney failure with tubular necrosis: Secondary | ICD-10-CM

## 2020-11-03 DIAGNOSIS — N189 Chronic kidney disease, unspecified: Secondary | ICD-10-CM | POA: Diagnosis not present

## 2020-11-03 DIAGNOSIS — E1122 Type 2 diabetes mellitus with diabetic chronic kidney disease: Secondary | ICD-10-CM

## 2020-11-03 DIAGNOSIS — I129 Hypertensive chronic kidney disease with stage 1 through stage 4 chronic kidney disease, or unspecified chronic kidney disease: Secondary | ICD-10-CM | POA: Diagnosis not present

## 2020-11-11 ENCOUNTER — Ambulatory Visit (HOSPITAL_COMMUNITY)
Admission: RE | Admit: 2020-11-11 | Discharge: 2020-11-11 | Disposition: A | Discharge status: Home / Self Care | Payer: Medicare Other | Source: Ambulatory Visit | Attending: Nephrology | Admitting: Nephrology

## 2020-11-11 DIAGNOSIS — N189 Chronic kidney disease, unspecified: Secondary | ICD-10-CM | POA: Diagnosis not present

## 2020-11-11 DIAGNOSIS — N170 Acute kidney failure with tubular necrosis: Secondary | ICD-10-CM | POA: Diagnosis not present

## 2020-11-11 DIAGNOSIS — E1122 Type 2 diabetes mellitus with diabetic chronic kidney disease: Secondary | ICD-10-CM | POA: Insufficient documentation

## 2020-11-14 DIAGNOSIS — I10 Essential (primary) hypertension: Secondary | ICD-10-CM | POA: Diagnosis not present

## 2020-11-14 DIAGNOSIS — Z1389 Encounter for screening for other disorder: Secondary | ICD-10-CM | POA: Diagnosis not present

## 2020-11-14 DIAGNOSIS — N184 Chronic kidney disease, stage 4 (severe): Secondary | ICD-10-CM | POA: Diagnosis not present

## 2020-11-14 DIAGNOSIS — E118 Type 2 diabetes mellitus with unspecified complications: Secondary | ICD-10-CM | POA: Diagnosis not present

## 2020-11-14 DIAGNOSIS — Z8673 Personal history of transient ischemic attack (TIA), and cerebral infarction without residual deficits: Secondary | ICD-10-CM | POA: Diagnosis not present

## 2020-11-14 DIAGNOSIS — E119 Type 2 diabetes mellitus without complications: Secondary | ICD-10-CM | POA: Diagnosis not present

## 2020-11-14 DIAGNOSIS — Z79899 Other long term (current) drug therapy: Secondary | ICD-10-CM | POA: Diagnosis not present

## 2020-11-14 DIAGNOSIS — Z0000 Encounter for general adult medical examination without abnormal findings: Secondary | ICD-10-CM | POA: Diagnosis not present

## 2020-11-14 DIAGNOSIS — F17210 Nicotine dependence, cigarettes, uncomplicated: Secondary | ICD-10-CM | POA: Diagnosis not present

## 2020-11-15 DIAGNOSIS — E119 Type 2 diabetes mellitus without complications: Secondary | ICD-10-CM | POA: Diagnosis not present

## 2020-11-15 DIAGNOSIS — I10 Essential (primary) hypertension: Secondary | ICD-10-CM | POA: Diagnosis not present

## 2020-11-15 DIAGNOSIS — N184 Chronic kidney disease, stage 4 (severe): Secondary | ICD-10-CM | POA: Diagnosis not present

## 2020-11-15 DIAGNOSIS — Z79899 Other long term (current) drug therapy: Secondary | ICD-10-CM | POA: Diagnosis not present

## 2020-11-15 DIAGNOSIS — E118 Type 2 diabetes mellitus with unspecified complications: Secondary | ICD-10-CM | POA: Diagnosis not present

## 2020-11-16 DIAGNOSIS — D631 Anemia in chronic kidney disease: Secondary | ICD-10-CM | POA: Diagnosis not present

## 2020-11-16 DIAGNOSIS — R809 Proteinuria, unspecified: Secondary | ICD-10-CM | POA: Diagnosis not present

## 2020-11-16 DIAGNOSIS — N189 Chronic kidney disease, unspecified: Secondary | ICD-10-CM | POA: Diagnosis not present

## 2020-11-16 DIAGNOSIS — N281 Cyst of kidney, acquired: Secondary | ICD-10-CM | POA: Diagnosis not present

## 2020-11-16 DIAGNOSIS — E1122 Type 2 diabetes mellitus with diabetic chronic kidney disease: Secondary | ICD-10-CM | POA: Diagnosis not present

## 2020-11-16 DIAGNOSIS — E1129 Type 2 diabetes mellitus with other diabetic kidney complication: Secondary | ICD-10-CM | POA: Diagnosis not present

## 2020-11-16 DIAGNOSIS — N170 Acute kidney failure with tubular necrosis: Secondary | ICD-10-CM | POA: Diagnosis not present

## 2020-11-17 ENCOUNTER — Other Ambulatory Visit (HOSPITAL_COMMUNITY): Payer: Self-pay | Admitting: Nephrology

## 2020-11-17 ENCOUNTER — Other Ambulatory Visit: Payer: Self-pay | Admitting: Nephrology

## 2020-11-17 DIAGNOSIS — D638 Anemia in other chronic diseases classified elsewhere: Secondary | ICD-10-CM

## 2020-11-17 DIAGNOSIS — E1129 Type 2 diabetes mellitus with other diabetic kidney complication: Secondary | ICD-10-CM

## 2020-11-17 DIAGNOSIS — R809 Proteinuria, unspecified: Secondary | ICD-10-CM | POA: Diagnosis not present

## 2020-11-17 DIAGNOSIS — N170 Acute kidney failure with tubular necrosis: Secondary | ICD-10-CM

## 2020-11-17 DIAGNOSIS — N189 Chronic kidney disease, unspecified: Secondary | ICD-10-CM | POA: Diagnosis not present

## 2020-11-17 DIAGNOSIS — E1122 Type 2 diabetes mellitus with diabetic chronic kidney disease: Secondary | ICD-10-CM

## 2020-11-17 DIAGNOSIS — N281 Cyst of kidney, acquired: Secondary | ICD-10-CM | POA: Diagnosis not present

## 2020-11-22 DIAGNOSIS — E118 Type 2 diabetes mellitus with unspecified complications: Secondary | ICD-10-CM | POA: Diagnosis not present

## 2020-11-22 DIAGNOSIS — F17200 Nicotine dependence, unspecified, uncomplicated: Secondary | ICD-10-CM | POA: Diagnosis not present

## 2020-11-22 DIAGNOSIS — H54415A Blindness right eye category 5, normal vision left eye: Secondary | ICD-10-CM | POA: Diagnosis not present

## 2020-11-22 DIAGNOSIS — N184 Chronic kidney disease, stage 4 (severe): Secondary | ICD-10-CM | POA: Diagnosis not present

## 2020-11-22 DIAGNOSIS — I69354 Hemiplegia and hemiparesis following cerebral infarction affecting left non-dominant side: Secondary | ICD-10-CM | POA: Diagnosis not present

## 2020-11-22 DIAGNOSIS — Z794 Long term (current) use of insulin: Secondary | ICD-10-CM | POA: Diagnosis not present

## 2020-11-22 DIAGNOSIS — R296 Repeated falls: Secondary | ICD-10-CM | POA: Diagnosis not present

## 2020-11-24 DIAGNOSIS — N184 Chronic kidney disease, stage 4 (severe): Secondary | ICD-10-CM | POA: Diagnosis not present

## 2020-11-24 DIAGNOSIS — F17200 Nicotine dependence, unspecified, uncomplicated: Secondary | ICD-10-CM | POA: Diagnosis not present

## 2020-11-24 DIAGNOSIS — E118 Type 2 diabetes mellitus with unspecified complications: Secondary | ICD-10-CM | POA: Diagnosis not present

## 2020-11-24 DIAGNOSIS — H54415A Blindness right eye category 5, normal vision left eye: Secondary | ICD-10-CM | POA: Diagnosis not present

## 2020-11-24 DIAGNOSIS — R296 Repeated falls: Secondary | ICD-10-CM | POA: Diagnosis not present

## 2020-11-24 DIAGNOSIS — Z794 Long term (current) use of insulin: Secondary | ICD-10-CM | POA: Diagnosis not present

## 2020-11-24 DIAGNOSIS — I69354 Hemiplegia and hemiparesis following cerebral infarction affecting left non-dominant side: Secondary | ICD-10-CM | POA: Diagnosis not present

## 2020-11-28 DIAGNOSIS — E118 Type 2 diabetes mellitus with unspecified complications: Secondary | ICD-10-CM | POA: Diagnosis not present

## 2020-11-28 DIAGNOSIS — R296 Repeated falls: Secondary | ICD-10-CM | POA: Diagnosis not present

## 2020-11-28 DIAGNOSIS — H54415A Blindness right eye category 5, normal vision left eye: Secondary | ICD-10-CM | POA: Diagnosis not present

## 2020-11-28 DIAGNOSIS — Z794 Long term (current) use of insulin: Secondary | ICD-10-CM | POA: Diagnosis not present

## 2020-11-28 DIAGNOSIS — N184 Chronic kidney disease, stage 4 (severe): Secondary | ICD-10-CM | POA: Diagnosis not present

## 2020-11-28 DIAGNOSIS — I69354 Hemiplegia and hemiparesis following cerebral infarction affecting left non-dominant side: Secondary | ICD-10-CM | POA: Diagnosis not present

## 2020-11-28 DIAGNOSIS — F17200 Nicotine dependence, unspecified, uncomplicated: Secondary | ICD-10-CM | POA: Diagnosis not present

## 2020-11-29 ENCOUNTER — Ambulatory Visit (HOSPITAL_COMMUNITY)
Admission: RE | Admit: 2020-11-29 | Discharge: 2020-11-29 | Disposition: A | Discharge status: Home / Self Care | Payer: Medicare Other | Source: Ambulatory Visit | Attending: Nephrology | Admitting: Nephrology

## 2020-11-29 DIAGNOSIS — D638 Anemia in other chronic diseases classified elsewhere: Secondary | ICD-10-CM | POA: Insufficient documentation

## 2020-11-29 DIAGNOSIS — R809 Proteinuria, unspecified: Secondary | ICD-10-CM | POA: Diagnosis not present

## 2020-11-29 DIAGNOSIS — E1122 Type 2 diabetes mellitus with diabetic chronic kidney disease: Secondary | ICD-10-CM | POA: Diagnosis not present

## 2020-11-29 DIAGNOSIS — E1129 Type 2 diabetes mellitus with other diabetic kidney complication: Secondary | ICD-10-CM | POA: Insufficient documentation

## 2020-11-29 DIAGNOSIS — N184 Chronic kidney disease, stage 4 (severe): Secondary | ICD-10-CM | POA: Diagnosis not present

## 2020-11-29 DIAGNOSIS — N170 Acute kidney failure with tubular necrosis: Secondary | ICD-10-CM | POA: Insufficient documentation

## 2020-11-29 DIAGNOSIS — G8192 Hemiplegia, unspecified affecting left dominant side: Secondary | ICD-10-CM | POA: Diagnosis not present

## 2020-11-29 DIAGNOSIS — N3289 Other specified disorders of bladder: Secondary | ICD-10-CM | POA: Diagnosis not present

## 2020-11-30 DIAGNOSIS — Z794 Long term (current) use of insulin: Secondary | ICD-10-CM | POA: Diagnosis not present

## 2020-11-30 DIAGNOSIS — H54415A Blindness right eye category 5, normal vision left eye: Secondary | ICD-10-CM | POA: Diagnosis not present

## 2020-11-30 DIAGNOSIS — I69354 Hemiplegia and hemiparesis following cerebral infarction affecting left non-dominant side: Secondary | ICD-10-CM | POA: Diagnosis not present

## 2020-11-30 DIAGNOSIS — F17200 Nicotine dependence, unspecified, uncomplicated: Secondary | ICD-10-CM | POA: Diagnosis not present

## 2020-11-30 DIAGNOSIS — E118 Type 2 diabetes mellitus with unspecified complications: Secondary | ICD-10-CM | POA: Diagnosis not present

## 2020-11-30 DIAGNOSIS — N184 Chronic kidney disease, stage 4 (severe): Secondary | ICD-10-CM | POA: Diagnosis not present

## 2020-11-30 DIAGNOSIS — R296 Repeated falls: Secondary | ICD-10-CM | POA: Diagnosis not present

## 2020-12-05 DIAGNOSIS — I69354 Hemiplegia and hemiparesis following cerebral infarction affecting left non-dominant side: Secondary | ICD-10-CM | POA: Diagnosis not present

## 2020-12-05 DIAGNOSIS — N184 Chronic kidney disease, stage 4 (severe): Secondary | ICD-10-CM | POA: Diagnosis not present

## 2020-12-05 DIAGNOSIS — E118 Type 2 diabetes mellitus with unspecified complications: Secondary | ICD-10-CM | POA: Diagnosis not present

## 2020-12-05 DIAGNOSIS — H54415A Blindness right eye category 5, normal vision left eye: Secondary | ICD-10-CM | POA: Diagnosis not present

## 2020-12-05 DIAGNOSIS — R296 Repeated falls: Secondary | ICD-10-CM | POA: Diagnosis not present

## 2020-12-05 DIAGNOSIS — F17200 Nicotine dependence, unspecified, uncomplicated: Secondary | ICD-10-CM | POA: Diagnosis not present

## 2020-12-05 DIAGNOSIS — Z794 Long term (current) use of insulin: Secondary | ICD-10-CM | POA: Diagnosis not present

## 2020-12-07 DIAGNOSIS — Z794 Long term (current) use of insulin: Secondary | ICD-10-CM | POA: Diagnosis not present

## 2020-12-07 DIAGNOSIS — R296 Repeated falls: Secondary | ICD-10-CM | POA: Diagnosis not present

## 2020-12-07 DIAGNOSIS — E118 Type 2 diabetes mellitus with unspecified complications: Secondary | ICD-10-CM | POA: Diagnosis not present

## 2020-12-07 DIAGNOSIS — H54415A Blindness right eye category 5, normal vision left eye: Secondary | ICD-10-CM | POA: Diagnosis not present

## 2020-12-07 DIAGNOSIS — F17200 Nicotine dependence, unspecified, uncomplicated: Secondary | ICD-10-CM | POA: Diagnosis not present

## 2020-12-07 DIAGNOSIS — I69354 Hemiplegia and hemiparesis following cerebral infarction affecting left non-dominant side: Secondary | ICD-10-CM | POA: Diagnosis not present

## 2020-12-07 DIAGNOSIS — N184 Chronic kidney disease, stage 4 (severe): Secondary | ICD-10-CM | POA: Diagnosis not present

## 2020-12-08 DIAGNOSIS — F17200 Nicotine dependence, unspecified, uncomplicated: Secondary | ICD-10-CM | POA: Diagnosis not present

## 2020-12-08 DIAGNOSIS — E118 Type 2 diabetes mellitus with unspecified complications: Secondary | ICD-10-CM | POA: Diagnosis not present

## 2020-12-08 DIAGNOSIS — R296 Repeated falls: Secondary | ICD-10-CM | POA: Diagnosis not present

## 2020-12-08 DIAGNOSIS — H54415A Blindness right eye category 5, normal vision left eye: Secondary | ICD-10-CM | POA: Diagnosis not present

## 2020-12-08 DIAGNOSIS — Z794 Long term (current) use of insulin: Secondary | ICD-10-CM | POA: Diagnosis not present

## 2020-12-08 DIAGNOSIS — N184 Chronic kidney disease, stage 4 (severe): Secondary | ICD-10-CM | POA: Diagnosis not present

## 2020-12-08 DIAGNOSIS — I69354 Hemiplegia and hemiparesis following cerebral infarction affecting left non-dominant side: Secondary | ICD-10-CM | POA: Diagnosis not present

## 2020-12-13 DIAGNOSIS — E118 Type 2 diabetes mellitus with unspecified complications: Secondary | ICD-10-CM | POA: Diagnosis not present

## 2020-12-13 DIAGNOSIS — F17200 Nicotine dependence, unspecified, uncomplicated: Secondary | ICD-10-CM | POA: Diagnosis not present

## 2020-12-13 DIAGNOSIS — N184 Chronic kidney disease, stage 4 (severe): Secondary | ICD-10-CM | POA: Diagnosis not present

## 2020-12-13 DIAGNOSIS — R296 Repeated falls: Secondary | ICD-10-CM | POA: Diagnosis not present

## 2020-12-13 DIAGNOSIS — I69354 Hemiplegia and hemiparesis following cerebral infarction affecting left non-dominant side: Secondary | ICD-10-CM | POA: Diagnosis not present

## 2020-12-13 DIAGNOSIS — Z794 Long term (current) use of insulin: Secondary | ICD-10-CM | POA: Diagnosis not present

## 2020-12-13 DIAGNOSIS — H54415A Blindness right eye category 5, normal vision left eye: Secondary | ICD-10-CM | POA: Diagnosis not present

## 2020-12-15 ENCOUNTER — Inpatient Hospital Stay (HOSPITAL_COMMUNITY): Payer: No Typology Code available for payment source

## 2020-12-15 DIAGNOSIS — F17200 Nicotine dependence, unspecified, uncomplicated: Secondary | ICD-10-CM | POA: Diagnosis not present

## 2020-12-15 DIAGNOSIS — I69354 Hemiplegia and hemiparesis following cerebral infarction affecting left non-dominant side: Secondary | ICD-10-CM | POA: Diagnosis not present

## 2020-12-15 DIAGNOSIS — H54415A Blindness right eye category 5, normal vision left eye: Secondary | ICD-10-CM | POA: Diagnosis not present

## 2020-12-15 DIAGNOSIS — Z794 Long term (current) use of insulin: Secondary | ICD-10-CM | POA: Diagnosis not present

## 2020-12-15 DIAGNOSIS — N184 Chronic kidney disease, stage 4 (severe): Secondary | ICD-10-CM | POA: Diagnosis not present

## 2020-12-15 DIAGNOSIS — R296 Repeated falls: Secondary | ICD-10-CM | POA: Diagnosis not present

## 2020-12-15 DIAGNOSIS — E118 Type 2 diabetes mellitus with unspecified complications: Secondary | ICD-10-CM | POA: Diagnosis not present

## 2020-12-16 DIAGNOSIS — Z794 Long term (current) use of insulin: Secondary | ICD-10-CM | POA: Diagnosis not present

## 2020-12-16 DIAGNOSIS — N184 Chronic kidney disease, stage 4 (severe): Secondary | ICD-10-CM | POA: Diagnosis not present

## 2020-12-16 DIAGNOSIS — H54415A Blindness right eye category 5, normal vision left eye: Secondary | ICD-10-CM | POA: Diagnosis not present

## 2020-12-16 DIAGNOSIS — F17200 Nicotine dependence, unspecified, uncomplicated: Secondary | ICD-10-CM | POA: Diagnosis not present

## 2020-12-16 DIAGNOSIS — I69354 Hemiplegia and hemiparesis following cerebral infarction affecting left non-dominant side: Secondary | ICD-10-CM | POA: Diagnosis not present

## 2020-12-16 DIAGNOSIS — E118 Type 2 diabetes mellitus with unspecified complications: Secondary | ICD-10-CM | POA: Diagnosis not present

## 2020-12-16 DIAGNOSIS — R296 Repeated falls: Secondary | ICD-10-CM | POA: Diagnosis not present

## 2020-12-22 ENCOUNTER — Ambulatory Visit (HOSPITAL_COMMUNITY): Payer: No Typology Code available for payment source | Admitting: Hematology

## 2020-12-28 DIAGNOSIS — E1122 Type 2 diabetes mellitus with diabetic chronic kidney disease: Secondary | ICD-10-CM | POA: Diagnosis not present

## 2020-12-28 DIAGNOSIS — N170 Acute kidney failure with tubular necrosis: Secondary | ICD-10-CM | POA: Diagnosis not present

## 2020-12-28 DIAGNOSIS — R809 Proteinuria, unspecified: Secondary | ICD-10-CM | POA: Diagnosis not present

## 2020-12-28 DIAGNOSIS — N281 Cyst of kidney, acquired: Secondary | ICD-10-CM | POA: Diagnosis not present

## 2020-12-28 DIAGNOSIS — N189 Chronic kidney disease, unspecified: Secondary | ICD-10-CM | POA: Diagnosis not present

## 2020-12-29 DIAGNOSIS — E118 Type 2 diabetes mellitus with unspecified complications: Secondary | ICD-10-CM | POA: Diagnosis not present

## 2020-12-29 DIAGNOSIS — I129 Hypertensive chronic kidney disease with stage 1 through stage 4 chronic kidney disease, or unspecified chronic kidney disease: Secondary | ICD-10-CM | POA: Diagnosis not present

## 2020-12-29 DIAGNOSIS — I10 Essential (primary) hypertension: Secondary | ICD-10-CM | POA: Diagnosis not present

## 2020-12-29 DIAGNOSIS — E1129 Type 2 diabetes mellitus with other diabetic kidney complication: Secondary | ICD-10-CM | POA: Diagnosis not present

## 2020-12-29 DIAGNOSIS — N170 Acute kidney failure with tubular necrosis: Secondary | ICD-10-CM | POA: Diagnosis not present

## 2020-12-29 DIAGNOSIS — N189 Chronic kidney disease, unspecified: Secondary | ICD-10-CM | POA: Diagnosis not present

## 2020-12-29 DIAGNOSIS — D631 Anemia in chronic kidney disease: Secondary | ICD-10-CM | POA: Diagnosis not present

## 2020-12-29 DIAGNOSIS — E1122 Type 2 diabetes mellitus with diabetic chronic kidney disease: Secondary | ICD-10-CM | POA: Diagnosis not present

## 2020-12-29 DIAGNOSIS — R809 Proteinuria, unspecified: Secondary | ICD-10-CM | POA: Diagnosis not present

## 2021-01-03 ENCOUNTER — Other Ambulatory Visit: Payer: Self-pay

## 2021-01-03 ENCOUNTER — Ambulatory Visit (HOSPITAL_COMMUNITY)
Admission: RE | Admit: 2021-01-03 | Discharge: 2021-01-03 | Disposition: A | Discharge status: Home / Self Care | Payer: Medicare Other | Source: Ambulatory Visit | Attending: Hematology | Admitting: Hematology

## 2021-01-03 ENCOUNTER — Inpatient Hospital Stay (HOSPITAL_COMMUNITY): Payer: Medicare Other | Attending: Hematology

## 2021-01-03 DIAGNOSIS — F17210 Nicotine dependence, cigarettes, uncomplicated: Secondary | ICD-10-CM | POA: Diagnosis not present

## 2021-01-03 DIAGNOSIS — N189 Chronic kidney disease, unspecified: Secondary | ICD-10-CM | POA: Diagnosis not present

## 2021-01-03 DIAGNOSIS — D472 Monoclonal gammopathy: Secondary | ICD-10-CM | POA: Insufficient documentation

## 2021-01-03 DIAGNOSIS — R768 Other specified abnormal immunological findings in serum: Secondary | ICD-10-CM | POA: Insufficient documentation

## 2021-01-03 LAB — COMPREHENSIVE METABOLIC PANEL
ALT: 25 U/L (ref 0–44)
AST: 15 U/L (ref 15–41)
Albumin: 3.7 g/dL (ref 3.5–5.0)
Alkaline Phosphatase: 79 U/L (ref 38–126)
Anion gap: 7 (ref 5–15)
BUN: 47 mg/dL — ABNORMAL HIGH (ref 8–23)
CO2: 22 mmol/L (ref 22–32)
Calcium: 9.9 mg/dL (ref 8.9–10.3)
Chloride: 109 mmol/L (ref 98–111)
Creatinine, Ser: 3.69 mg/dL — ABNORMAL HIGH (ref 0.61–1.24)
GFR, Estimated: 17 mL/min — ABNORMAL LOW (ref >60)
Glucose, Bld: 128 mg/dL — ABNORMAL HIGH (ref 70–99)
Potassium: 4.4 mmol/L (ref 3.5–5.1)
Sodium: 138 mmol/L (ref 135–145)
Total Bilirubin: 0.5 mg/dL (ref 0.3–1.2)
Total Protein: 7.4 g/dL (ref 6.5–8.1)

## 2021-01-03 LAB — CBC WITH DIFFERENTIAL/PLATELET
Abs Immature Granulocytes: 0.02 10*3/uL (ref 0.00–0.07)
Basophils Absolute: 0 10*3/uL (ref 0.0–0.1)
Basophils Relative: 0 %
Eosinophils Absolute: 0.2 10*3/uL (ref 0.0–0.5)
Eosinophils Relative: 3 %
HCT: 34.8 % — ABNORMAL LOW (ref 39.0–52.0)
Hemoglobin: 10.9 g/dL — ABNORMAL LOW (ref 13.0–17.0)
Immature Granulocytes: 0 %
Lymphocytes Relative: 16 %
Lymphs Abs: 1.2 10*3/uL (ref 0.7–4.0)
MCH: 28.8 pg (ref 26.0–34.0)
MCHC: 31.3 g/dL (ref 30.0–36.0)
MCV: 92.1 fL (ref 80.0–100.0)
Monocytes Absolute: 0.6 10*3/uL (ref 0.1–1.0)
Monocytes Relative: 9 %
Neutro Abs: 5.1 10*3/uL (ref 1.7–7.7)
Neutrophils Relative %: 72 %
Platelets: 237 10*3/uL (ref 150–400)
RBC: 3.78 MIL/uL — ABNORMAL LOW (ref 4.22–5.81)
RDW: 13.5 % (ref 11.5–15.5)
WBC: 7.2 10*3/uL (ref 4.0–10.5)
nRBC: 0 % (ref 0.0–0.2)

## 2021-01-03 LAB — LACTATE DEHYDROGENASE: LDH: 126 U/L (ref 98–192)

## 2021-01-04 LAB — KAPPA/LAMBDA LIGHT CHAINS
Kappa free light chain: 206.5 mg/L — ABNORMAL HIGH (ref 3.3–19.4)
Kappa, lambda light chain ratio: 3.64 — ABNORMAL HIGH (ref 0.26–1.65)
Lambda free light chains: 56.7 mg/L — ABNORMAL HIGH (ref 5.7–26.3)

## 2021-01-05 LAB — PROTEIN ELECTROPHORESIS, SERUM
A/G Ratio: 1.1 (ref 0.7–1.7)
Albumin ELP: 3.6 g/dL (ref 2.9–4.4)
Alpha-1-Globulin: 0.2 g/dL (ref 0.0–0.4)
Alpha-2-Globulin: 0.8 g/dL (ref 0.4–1.0)
Beta Globulin: 1.2 g/dL (ref 0.7–1.3)
Gamma Globulin: 1 g/dL (ref 0.4–1.8)
Globulin, Total: 3.2 g/dL (ref 2.2–3.9)
Total Protein ELP: 6.8 g/dL (ref 6.0–8.5)

## 2021-01-05 LAB — IMMUNOFIXATION ELECTROPHORESIS
IgA: 418 mg/dL (ref 61–437)
IgG (Immunoglobin G), Serum: 1109 mg/dL (ref 603–1613)
IgM (Immunoglobulin M), Srm: 64 mg/dL (ref 20–172)
Total Protein ELP: 6.9 g/dL (ref 6.0–8.5)

## 2021-01-08 NOTE — Progress Notes (Deleted)
Trevor Crawford, Trevor Crawford [id number]   CLINIC:  Medical Oncology/Hematology  PCP:  Rosita Fire, MD [place] [address] [phone]   REASON FOR VISIT:  Follow-up for MGUS  PRIOR THERAPY: None  CURRENT THERAPY: Observation  INTERVAL HISTORY:  Trevor Crawford 70 y.o. male returns for routine follow-up of his MGUS.  He was last seen by Dr. Delton Coombes on 08/17/2020.  Patient reports he has been doing well since his last visit.  Denies any recent fevers, infections, or hospitalizations. ***He denies any new bone pain or recent fractures. He denies any B symptoms.   *** Denies any nausea, vomiting, or diarrhea.  ***No new neurologic symptoms such as tinnitus, new-onset hearing loss, blurred vision, headache, or dizziness.  Denies any numbness or tingling in hands or feet. ***No thromboembolic events since his last visit. Has not noticed any recent bleeding such as epistaxis, hematuria, hematemesis, melena, or hematochezia.  ***Denies recent chest pain on exertion, shortness of breath on minimal exertion, pre-syncopal episodes, or palpitations.  ***Denies any recent fevers, infections, or recent hospitalizations.  ***No new masses or lymphadenopathy per his report.  ***Patient reports appetite at *** and energy level at ***. He is maintaining stable weight at this time.    REVIEW OF SYSTEMS:  Review of Systems - Oncology    PAST MEDICAL/SURGICAL HISTORY:  Past Medical History:  Diagnosis Date  . Arthritis   . Cataract    bilateral  . Chronic kidney disease   . Depression   . Diabetes mellitus without complication ([place])   . GERD (gastroesophageal reflux disease)   . Glaucoma   . Hyperlipidemia   . Hypertension   . Legally blind in right eye, as defined in [place]   . Neuromuscular disorder ([place])   . Neuropathy   . Stroke [facility])    Past Surgical History:  Procedure Laterality Date  . CATARACT EXTRACTION Left   .  CYSTOSCOPY WITH RETROGRADE PYELOGRAM, URETEROSCOPY AND STENT PLACEMENT Left 10/06/2015   Procedure: CYSTOSCOPY WITH RETROGRADE PYELOGRAM, AND LEFT STENT PLACEMENT ;  Surgeon: Cleon Gustin, MD;  Location: [facility];  Service: Urology;  Laterality: Left;     SOCIAL HISTORY:  Social History   Socioeconomic History  . Marital status: Married    Spouse name: Not on file  . Number of children: Not on file  . Years of education: Not on file  . Highest education level: Not on file  Occupational History  . Not on file  Tobacco Use  . Smoking status: Current Every Day Smoker    Packs/day: 1.00    Years: 55.00    Pack years: 55.00    Types: Cigarettes  . Smokeless tobacco: Never Used  Substance and Sexual Activity  . Alcohol use: No  . Drug use: No  . Sexual activity: Never    Birth control/protection: None  Other Topics Concern  . Not on file  Social History Narrative  . Not on file   Social Determinants of Health   Financial Resource Strain: Medium Risk  . Difficulty of Paying Living Expenses: Somewhat hard  Food Insecurity: No Food Insecurity  . Worried About Charity fundraiser in the Last Year: Never true  . Ran Out of Food in the Last Year: Never true  Transportation Needs: No Transportation Needs  . Lack of Transportation (Medical): No  . Lack of Transportation (Non-Medical): No  Physical Activity: Inactive  . Days of Exercise per Week:  0 days  . Minutes of Exercise per Session: 0 min  Stress: No Stress Concern Present  . Feeling of Stress : Not at all  Social Connections: Socially Isolated  . Frequency of Communication with Friends and Family: Never  . Frequency of Social Gatherings with Friends and Family: Never  . Attends Religious Services: Never  . Active Member of Clubs or Organizations: No  . Attends Archivist Meetings: Never  . Marital Status: Married  Human resources officer Violence: Not At Risk  . Fear of Current or Ex-Partner: No  . Emotionally  Abused: No  . Physically Abused: No  . Sexually Abused: No    FAMILY HISTORY:  Family History  Problem Relation Age of Onset  . Cancer Mother   . Alcoholism Father   . Cancer Father   . Cancer Other   . Cancer Sister   . Diabetes Brother   . Stroke Brother     CURRENT MEDICATIONS:  Outpatient Encounter Medications as of 01/09/2021  Medication Sig  . amLODipine (NORVASC) 2.5 MG tablet Take 1 tablet (2.5 mg total) by mouth daily.  Marland Kitchen amLODipine (NORVASC) 5 MG tablet Take 5 mg by mouth daily.  Marland Kitchen aspirin EC 325 MG EC tablet Take 1 tablet (325 mg total) daily by mouth.  Marland Kitchen atorvastatin (LIPITOR) 80 MG tablet Take 1 tablet (80 mg total) at bedtime by mouth.  . brimonidine (ALPHAGAN) 0.2 % ophthalmic solution Place 1 drop 2 (two) times daily into the right eye.   . carboxymethylcellulose (REFRESH PLUS) 0.5 % SOLN Place 1 drop 4 (four) times daily into the right eye.   . carvedilol (COREG) 3.125 MG tablet Take by mouth.  . cholecalciferol (VITAMIN D) 1000 units tablet Take 1,000 Units 2 (two) times daily by mouth.  . clopidogrel (PLAVIX) 75 MG tablet Take 1 tablet (75 mg total) daily by mouth.  . dorzolamide-timolol (COSOPT) 22.3-6.8 MG/ML ophthalmic solution Place 1 drop 2 (two) times daily into the right eye.   . furosemide (LASIX) 40 MG tablet Take 40 mg 2 (two) times daily by mouth.   . Insulin Glargine (LANTUS SOLOSTAR) 100 UNIT/ML Solostar Pen Inject 29 Units into the skin daily.  Marland Kitchen latanoprost (XALATAN) 0.005 % ophthalmic solution Place 1 drop at bedtime into the right eye.  . ranitidine (ZANTAC) 150 MG tablet Take 150 mg every evening by mouth.  . simethicone (MYLICON) 80 MG chewable tablet Chew 160 mg by mouth 2 (two) times daily as needed for flatulence.  . tamsulosin (FLOMAX) 0.4 MG CAPS capsule Take 1 capsule (0.4 mg total) by mouth daily.   No facility-administered encounter medications on file as of 01/09/2021.    ALLERGIES:  No Known Allergies   PHYSICAL EXAM:  ECOG  PERFORMANCE STATUS: {CHL ONC ECOG PS:[id number]}  There were no vitals filed for this visit. There were no vitals filed for this visit. Physical Exam   LABORATORY DATA:  I have reviewed the labs as listed.  CBC    Component Value Date/Time   WBC 7.2 01/03/2021 0935   RBC 3.78 (L) 01/03/2021 0935   HGB 10.9 (L) 01/03/2021 0935   HGB 12.8 (L) 07/02/2017 1141   HCT 34.8 (L) 01/03/2021 0935   HCT 38.6 07/02/2017 1141   PLT 237 01/03/2021 0935   PLT 237 07/02/2017 1141   MCV 92.1 01/03/2021 0935   MCV 89 07/02/2017 1141   MCH 28.8 01/03/2021 0935   MCHC 31.3 01/03/2021 0935   RDW 13.5 01/03/2021 0935   RDW  14.5 07/02/2017 1141   LYMPHSABS 1.2 01/03/2021 0935   LYMPHSABS 1.6 07/02/2017 1141   MONOABS 0.6 01/03/2021 0935   EOSABS 0.2 01/03/2021 0935   EOSABS 0.3 07/02/2017 1141   BASOSABS 0.0 01/03/2021 0935   BASOSABS 0.0 07/02/2017 1141   CMP Latest Ref Rng & Units 01/03/2021 08/06/2020 07/02/2017  Glucose 70 - 99 mg/dL 128(H) 167(H) 110(H)  BUN 8 - 23 mg/dL 47(H) 46(H) 41(H)  Creatinine 0.61 - 1.24 mg/dL 3.69(H) 3.37(H) 2.37(H)  Sodium 135 - 145 mmol/L 138 139 142  Potassium 3.5 - 5.1 mmol/L 4.4 4.5 4.7  Chloride 98 - 111 mmol/L 109 108 104  CO2 22 - 32 mmol/L 22 24 21   Calcium 8.9 - 10.3 mg/dL 9.9 10.0 9.9  Total Protein 6.5 - 8.1 g/dL 7.4 7.3 -  Total Bilirubin 0.3 - 1.2 mg/dL 0.5 0.4 -  Alkaline Phos 38 - 126 U/L 79 94 -  AST 15 - 41 U/L 15 12(L) -  ALT 0 - 44 U/L 25 16 -    DIAGNOSTIC IMAGING:  I have independently reviewed the relevant imaging and discussed with the patient.   ASSESSMENT: *** 1. MGUS: -Patient evaluated at the request of Dr. Theador Hawthorne for monoclonal gammopathy. Urine immunofixation showed faint monoclonal free kappa light chains. - Reviewed most recent labs (01/03/2021) which showed Hgb 10.9/MCV 92.1, creatinine 3.69, calcium 9.9.   - MGUS/myeloma panel (01/03/2021) showed no evidence M spike on SPEP, but abnormal free light chains with elevated  kappa 206.5, elevated lambda 56.7, and elevated ratio 3.64; immunofixation electrophoresis shows polyclonal increase in immunoglobulins.  LDH normal at 126.  - Skeletal survey on 08/10/2020 shows faint lucent lesions in the frontal calvarium.  No other lytic lesions. - X-ray of skull (01/03/2021) shows stable small areas of lucency involving the left frontal bone, most likely chronic as they were also noted on CT head from 06/22/2017. - Patient does not meet CRAB criteria, although he does have creatinine elevated at > 2.0, this appears to be secondary to progression of CKD rather than related to plasma cell dyscrasia  2. CKD stage III/IV: -Follows up with Dr. Theador Hawthorne. -CKD thought to be predominantly secondary to poorly controlled diabetes. -Renal ultrasound on 06/16/2020 shows increased renal echogenicity bilaterally indicative of underlying medical renal disease. Renal cortical thickness normal bilaterally. Minimally complex cyst mid left kidney 1.0 x 1.1 x 1.2 cm. - Worsening kidney function addressed by Dr. Toya Smothers note dated 12/29/2020, suspect AKI on CKD secondary to obstructive issues versus possible worsening of CKD  3.  Normocytic anemia - Most recent labs (01/03/2021) show Hgb 10.9/MCV 92.1 - Suspect anemia of CKD, being followed by Dr. Theador Hawthorne, no need for EPO at this time  4. Social/family history: -He is a current active smoker, 1 pack/day for 27 years. -2 sisters died of cancer. Maternal aunt also had cancer. Patient does not know the types.   PLAN:  *** 1. Elevated free light chains and ratio: -Labs and imaging studies are stable, as above - We will repeat labs in 6 months along with lateral view of the skull to follow-up on the lucencies noted above  2. CKD stage III/IV: - Continue follow-up with Dr. Theador Hawthorne  3.  Normocytic anemia - Continue monitoring of CBC with repeat labs in 6 months - Continue follow-up with Dr. Theador Hawthorne, who will initiate ESA if  indicated  4.  Tobacco abuse *** -Patient is a current active smoker, has smoked 1 pack/day for the past 52 years - This  patient meets criteria for low-dose CT lung cancer screening (age 44-80 with a 20+ pack year history, *** current everyday smoker /OR/ quit < 15 years ago ***, no current signs or symptoms of lung cancer) - The shared decision making visit discussion performed during today's visit included risks and benefits of screening, potential for follow-up, diagnostic testing for abnormal scans, potential for false positive tests, overdiagnosis, discussion about total radiation exposure - Patient stated willingness to undergo diagnostics and treatment as needed - Patient was counseled on smoking cessation to decrease the  risk of lung cancer, pulmonary disease, heart disease, and stroke - Patient has been referred to Lung Cancer Screening Nurse Coordinator for further scheduling of LDCT and for further resources regarding free nicotine replacement therapy and information about smoking cessation classes   PLAN SUMMARY & DISPOSITION: ***  All questions were answered. The patient knows to call the clinic with any problems, questions or concerns.  Medical decision making: ***  Time spent on visit: I spent {CHL ONC TIME VISIT - QASUO:[id number]} counseling the patient face to face. The total time spent in the appointment was {CHL ONC TIME VISIT - EXMDY:[id number]} and more than 50% was on counseling.   Harriett Rush, PA-C  01/08/21 6:49 PM

## 2021-01-09 ENCOUNTER — Ambulatory Visit (HOSPITAL_COMMUNITY): Payer: No Typology Code available for payment source | Admitting: Physician Assistant

## 2021-01-10 NOTE — Progress Notes (Signed)
Virtual Visit via Telephone Note [facility]  I connected with Trevor Crawford  on 01/11/21 at 10:35 AM by telephone and verified that I am speaking with the correct person using two identifiers.  Location: Patient: Home Provider: [facility]   I discussed the limitations, risks, security and privacy concerns of performing an evaluation and management service by telephone and the availability of in person appointments. I also discussed with the patient that there may be a patient responsible charge related to this service. The patient expressed understanding and agreed to proceed.   History of Present Illness: Mr. Trevor Crawford (70 y.o. male) is contacted for routine follow-up of his MGUS.  He was last seen by Dr. Delton Coombes on 08/17/2020.  Patient reports he has been doing well since his last visit.  Denies any recent fevers, infections, or hospitalizations. He denies any new bone pain or recent fractures. He denies any B symptoms.    Denies any nausea, vomiting, or diarrhea.  No new neurologic symptoms such as tinnitus, new-onset hearing loss, blurred vision, headache, or dizziness.  Denies any numbness or tingling in hands or feet. No thromboembolic events since his last visit. Has not noticed any recent bleeding such as epistaxis, hematuria, hematemesis, melena, or hematochezia.  Denies recent chest pain on exertion, shortness of breath on minimal exertion, pre-syncopal episodes, or palpitations.  No new masses or lymphadenopathy per his report.  Patient reports appetite at 100% and energy level at 100%. He is maintaining stable weight at this time.  He reports a dry cough that started today, but he denies any other symptoms.    Observations/Objective: Review of Systems  Constitutional: Negative for chills, diaphoresis, fever, malaise/fatigue and weight loss.  HENT: Negative for hearing loss, nosebleeds and tinnitus.   Respiratory: Positive for cough (dry). Negative  for hemoptysis and sputum production.   Cardiovascular: Positive for leg swelling. Negative for chest pain.  Gastrointestinal: Negative for abdominal pain, nausea and vomiting.  Neurological: Negative for dizziness, tingling and headaches.     PHYSICAL EXAM (per limitations of virtual telephone visit): The patient is alert and oriented x 3, exhibiting adequate mentation, good mood, and ability to speak in full sentences and execute sound judgement.   ASSESSMENT: 1.  MGUS: -Patient evaluated at the request of Dr. Theador Hawthorne for monoclonal gammopathy.  Urine immunofixation showed faint monoclonal free kappa light chains. - Reviewed most recent labs (01/03/2021) which showed Hgb 10.9/MCV 92.1, creatinine 3.69, calcium 9.9.   - MGUS/myeloma panel (01/03/2021) showed no evidence M spike on SPEP, but abnormal free light chains with elevated kappa 206.5, elevated lambda 56.7, and elevated ratio 3.64; immunofixation electrophoresis shows polyclonal increase in immunoglobulins.  LDH normal at 126.  - Skeletal survey on 08/10/2020 shows faint lucent lesions in the frontal calvarium.  No other lytic lesions. - X-ray of skull (01/03/2021) shows stable small areas of lucency involving the left frontal bone, most likely chronic as they were also noted on CT head from 06/22/2017. - Patient does not meet CRAB criteria, although he does have creatinine elevated at > 2.0, this appears to be secondary to progression of CKD rather than related to plasma cell dyscrasia  2.  CKD stage III/IV: -Follows up with Dr. Theador Hawthorne. -CKD thought to be predominantly secondary to poorly controlled diabetes. -Renal ultrasound on 06/16/2020 shows increased renal echogenicity bilaterally indicative of underlying medical renal disease.  Renal cortical thickness normal bilaterally.  Minimally complex cyst mid left kidney 1.0 x 1.1 x 1.2  cm. - Worsening kidney function addressed by Dr. Toya Smothers note dated 12/29/2020, suspect AKI on CKD  secondary to obstructive issues versus possible worsening of CKD  3.  Normocytic anemia - Most recent labs (01/03/2021) show Hgb 10.9/MCV 92.1 - Suspect anemia of CKD, being followed by Dr. Theador Hawthorne, no need for EPO at this time  4.  Tobacco abuse  - Patient is a current active smoker, has smoked 1 pack/day for the past 52 years - Patient underwent screening for lung cancer with low-dose CT chest, most recently 06/14/2020 which was BI-RADS 1 (negative) but did show emphysema, aortic atherosclerosis, and coronary artery calcification  5.  Social/family history: -He is a current active smoker, 1 pack/day for 34 years. -2 sisters died of cancer.  Maternal aunt also had cancer.  Patient does not know the types.    PLAN:  1. Elevated free light chains and ratio: -Labs and imaging studies are stable, as above - We will repeat labs in 6 months along with lateral view of the skull to follow-up on the lucencies noted above  2.  CKD stage III/IV: - Continue follow-up with Dr. Theador Hawthorne  3.  Normocytic anemia - Continue monitoring of CBC with repeat labs in 6 months - Continue follow-up with Dr. Theador Hawthorne, who will initiate ESA if indicated   PLAN SUMMARY & DISPOSITION: - Labs and skull Xray in 6 months - Follow-up visit in 6 months   I discussed the assessment and treatment plan with the patient. The patient was provided an opportunity to ask questions and all were answered. The patient agreed with the plan and demonstrated an understanding of the instructions.   The patient was advised to call back or seek an in-person evaluation if the symptoms worsen or if the condition fails to improve as anticipated.  I provided 13 minutes of non-face-to-face time during this encounter.   Harriett Rush, PA-C 01/11/21 10:57 AM

## 2021-01-10 NOTE — Progress Notes (Deleted)
Note created in error. Please disregard.

## 2021-01-11 ENCOUNTER — Other Ambulatory Visit: Payer: Self-pay

## 2021-01-11 ENCOUNTER — Inpatient Hospital Stay (HOSPITAL_COMMUNITY): Payer: No Typology Code available for payment source | Attending: Physician Assistant | Admitting: Physician Assistant

## 2021-01-11 DIAGNOSIS — D472 Monoclonal gammopathy: Secondary | ICD-10-CM

## 2021-01-13 ENCOUNTER — Other Ambulatory Visit: Payer: Self-pay | Admitting: *Deleted

## 2021-01-13 DIAGNOSIS — N186 End stage renal disease: Secondary | ICD-10-CM

## 2021-01-29 DIAGNOSIS — N184 Chronic kidney disease, stage 4 (severe): Secondary | ICD-10-CM | POA: Diagnosis not present

## 2021-01-29 DIAGNOSIS — E118 Type 2 diabetes mellitus with unspecified complications: Secondary | ICD-10-CM | POA: Diagnosis not present

## 2021-02-07 DIAGNOSIS — I129 Hypertensive chronic kidney disease with stage 1 through stage 4 chronic kidney disease, or unspecified chronic kidney disease: Secondary | ICD-10-CM | POA: Diagnosis not present

## 2021-02-07 DIAGNOSIS — E1122 Type 2 diabetes mellitus with diabetic chronic kidney disease: Secondary | ICD-10-CM | POA: Diagnosis not present

## 2021-02-07 DIAGNOSIS — N189 Chronic kidney disease, unspecified: Secondary | ICD-10-CM | POA: Diagnosis not present

## 2021-02-07 DIAGNOSIS — E1129 Type 2 diabetes mellitus with other diabetic kidney complication: Secondary | ICD-10-CM | POA: Diagnosis not present

## 2021-02-07 DIAGNOSIS — R809 Proteinuria, unspecified: Secondary | ICD-10-CM | POA: Diagnosis not present

## 2021-02-10 DIAGNOSIS — E1129 Type 2 diabetes mellitus with other diabetic kidney complication: Secondary | ICD-10-CM | POA: Diagnosis not present

## 2021-02-10 DIAGNOSIS — I129 Hypertensive chronic kidney disease with stage 1 through stage 4 chronic kidney disease, or unspecified chronic kidney disease: Secondary | ICD-10-CM | POA: Diagnosis not present

## 2021-02-10 DIAGNOSIS — E1122 Type 2 diabetes mellitus with diabetic chronic kidney disease: Secondary | ICD-10-CM | POA: Diagnosis not present

## 2021-02-10 DIAGNOSIS — N189 Chronic kidney disease, unspecified: Secondary | ICD-10-CM | POA: Diagnosis not present

## 2021-02-10 DIAGNOSIS — R809 Proteinuria, unspecified: Secondary | ICD-10-CM | POA: Diagnosis not present

## 2021-02-10 DIAGNOSIS — D631 Anemia in chronic kidney disease: Secondary | ICD-10-CM | POA: Diagnosis not present

## 2021-02-10 DIAGNOSIS — D472 Monoclonal gammopathy: Secondary | ICD-10-CM | POA: Diagnosis not present

## 2021-02-15 ENCOUNTER — Ambulatory Visit (INDEPENDENT_AMBULATORY_CARE_PROVIDER_SITE_OTHER): Payer: Medicare Other | Admitting: Vascular Surgery

## 2021-02-15 ENCOUNTER — Other Ambulatory Visit: Payer: Self-pay

## 2021-02-15 ENCOUNTER — Ambulatory Visit (INDEPENDENT_AMBULATORY_CARE_PROVIDER_SITE_OTHER): Payer: Medicare Other

## 2021-02-15 ENCOUNTER — Encounter: Payer: Self-pay | Admitting: Vascular Surgery

## 2021-02-15 VITALS — BP 180/95 | HR 68 | Temp 98.2°F | Ht 72.0 in | Wt 181.0 lb

## 2021-02-15 DIAGNOSIS — N184 Chronic kidney disease, stage 4 (severe): Secondary | ICD-10-CM

## 2021-02-15 DIAGNOSIS — N186 End stage renal disease: Secondary | ICD-10-CM

## 2021-02-15 NOTE — Progress Notes (Signed)
Vascular and Vein Specialist of [place]  Patient name: Trevor Crawford MRN: [id number] DOB: 1950/12/07 Sex: male  REASON FOR CONSULT: Discuss access for hemodialysis  Nephrologist: Dr. Theador Hawthorne  HPI: Trevor Crawford is a 70 y.o. male, who is here today for discussion of access for hemodialysis.  He is here today with his wife.  He has had progressive chronic renal insufficiency now stage IV.  This is felt to be due to hypertension and diabetes.  He is approaching need for hemodialysis and we are seeing him today for discussion of access.  He is not on any anticoagulants.  He is on Plavix.  He does not have a pacemaker.  Past Medical History:  Diagnosis Date   Arthritis    Cataract    bilateral   Chronic kidney disease    Depression    Diabetes mellitus without complication (HCC)    GERD (gastroesophageal reflux disease)    Glaucoma    Hyperlipidemia    Hypertension    Legally blind in right eye, as defined in [place]    Neuromuscular disorder ([place])    Neuropathy    Stroke ([place])     Family History  Problem Relation Age of Onset   Cancer Mother    Alcoholism Father    Cancer Father    Cancer Other    Cancer Sister    Diabetes Brother    Stroke Brother     SOCIAL HISTORY: Social History   Socioeconomic History   Marital status: Married    Spouse name: Not on file   Number of children: Not on file   Years of education: Not on file   Highest education level: Not on file  Occupational History   Not on file  Tobacco Use   Smoking status: Every Day    Packs/day: 1.00    Years: 55.00    Pack years: 55.00    Types: Cigarettes   Smokeless tobacco: Never  Substance and Sexual Activity   Alcohol use: No   Drug use: No   Sexual activity: Never    Birth control/protection: None  Other Topics Concern   Not on file  Social History Narrative   Not on file   Social Determinants of Health   Financial Resource Strain: Medium Risk    Difficulty of Paying Living Expenses: Somewhat hard  Food Insecurity: No Food Insecurity   Worried About Charity fundraiser in the Last Year: Never true   Ran Out of Food in the Last Year: Never true  Transportation Needs: No Transportation Needs   Lack of Transportation (Medical): No   Lack of Transportation (Non-Medical): No  Physical Activity: Inactive   Days of Exercise per Week: 0 days   Minutes of Exercise per Session: 0 min  Stress: No Stress Concern Present   Feeling of Stress : Not at all  Social Connections: Socially Isolated   Frequency of Communication with Friends and Family: Never   Frequency of Social Gatherings with Friends and Family: Never   Attends Religious Services: Never   Marine scientist or Organizations: No   Attends Music therapist: Never   Marital Status: Married  Human resources officer Violence: Not At Risk   Fear of Current or Ex-Partner: No   Emotionally Abused: No   Physically Abused: No   Sexually Abused: No    No Known Allergies  Current Outpatient Medications  Medication Sig Dispense Refill   amLODipine (NORVASC) 5 MG tablet Take  5 mg by mouth daily.     atorvastatin (LIPITOR) 80 MG tablet Take 1 tablet (80 mg total) at bedtime by mouth. 30 tablet 0   brimonidine (ALPHAGAN) 0.2 % ophthalmic solution Place 1 drop 2 (two) times daily into the right eye.      carboxymethylcellulose (REFRESH PLUS) 0.5 % SOLN Place 1 drop 4 (four) times daily into the right eye.      carvedilol (COREG) 3.125 MG tablet Take by mouth.     cholecalciferol (VITAMIN D) 1000 units tablet Take 1,000 Units 2 (two) times daily by mouth.     clopidogrel (PLAVIX) 75 MG tablet Take 1 tablet (75 mg total) daily by mouth. 30 tablet 0   Insulin Glargine (LANTUS SOLOSTAR) 100 UNIT/ML Solostar Pen Inject 29 Units into the skin daily. 15 mL 11   latanoprost (XALATAN) 0.005 % ophthalmic solution Place 1 drop at bedtime into the right eye.     aspirin EC 325 MG EC tablet  Take 1 tablet (325 mg total) daily by mouth. (Patient not taking: Reported on 02/15/2021) 30 tablet 0   dorzolamide-timolol (COSOPT) 22.3-6.8 MG/ML ophthalmic solution Place 1 drop 2 (two) times daily into the right eye.  (Patient not taking: Reported on 02/15/2021)     furosemide (LASIX) 40 MG tablet Take 40 mg 2 (two) times daily by mouth.  (Patient not taking: No sig reported)     ranitidine (ZANTAC) 150 MG tablet Take 150 mg every evening by mouth. (Patient not taking: No sig reported)     simethicone (MYLICON) 80 MG chewable tablet Chew 160 mg by mouth 2 (two) times daily as needed for flatulence. (Patient not taking: Reported on 02/15/2021)     tamsulosin (FLOMAX) 0.4 MG CAPS capsule Take 1 capsule (0.4 mg total) by mouth daily. (Patient not taking: No sig reported) 10 capsule 0   No current facility-administered medications for this visit.    REVIEW OF SYSTEMS:  [X]  denotes positive finding, [ ]  denotes negative finding Cardiac  Comments:  Chest pain or chest pressure:    Shortness of breath upon exertion:    Short of breath when lying flat:    Irregular heart rhythm:        Vascular    Pain in calf, thigh, or hip brought on by ambulation:    Pain in feet at night that wakes you up from your sleep:     Blood clot in your veins:    Leg swelling:  x       Pulmonary    Oxygen at home:    Productive cough:     Wheezing:         Neurologic    Sudden weakness in arms or legs:     Sudden numbness in arms or legs:  x   Sudden onset of difficulty speaking or slurred speech:    Temporary loss of vision in one eye:     Problems with dizziness:         Gastrointestinal    Blood in stool:     Vomited blood:         Genitourinary    Burning when urinating:     Blood in urine:        Psychiatric    Major depression:         Hematologic    Bleeding problems:    Problems with blood clotting too easily:        Skin    Rashes or ulcers:  Constitutional    Fever or  chills:      PHYSICAL EXAM: Vitals:   02/15/21 0934  BP: (!) 180/95  Pulse: 68  Temp: 98.2 F (36.8 C)  TempSrc: Oral  SpO2: 98%  Weight: 181 lb (82.1 kg)  Height: 6' (1.829 m)    GENERAL: The patient is a well-nourished male, in no acute distress. The vital signs are documented above. CARDIOVASCULAR: 2+ radial pulses bilaterally.  Small surface veins bilaterally PULMONARY: There is good air exchange  MUSCULOSKELETAL: There are no major deformities or cyanosis. NEUROLOGIC: No focal weakness or paresthesias are detected. SKIN: There are no ulcers or rashes noted. PSYCHIATRIC: The patient has a normal affect.  DATA:  Arterial and venous duplex reviewed.  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  Has triphasic flow at the radial artery level bilaterally.  Cephalic veins do not appear to be adequate for fistula creation bilaterally.  He does have a large caliber basilic vein at the antecubital space and moderate in the upper arm on the left.  MEDICAL ISSUES: Had a long discussion with the patient and his wife present.  Discussed options for hemodialysis to include tunneled hemodialysis catheter for acute dialysis, AV graft and AV fistula.  Discussed the benefits of all of these.  Also explained the near certain need for ongoing maintenance of any access.  I have recommended left for stage basilic vein transposition.  Explained the potential for not maturation.  Explained that we would see him at 4 to 6 weeks postoperative to repeat the duplex and determine his maturation of the for stage fistula.  I did explain the need for second stage transposition assuming he had adequate basilic vein fistula maturation.  We will schedule this at his convenience at [facility] on 02/21/2021   Rosetta Posner, MD [facility] Vascular and Vein Specialists of [facility] [phone] Pager [phone]  Note: Portions of this report may have been transcribed using voice  recognition software.  Every effort has been made to ensure accuracy; however, inadvertent computerized transcription errors may still be present.

## 2021-02-15 NOTE — H&P (View-Only) (Signed)
Vascular and Vein Specialist of [place]  Patient name: Trevor Crawford MRN: [id number] DOB: [birth year] Sex: male  REASON FOR CONSULT: Discuss access for hemodialysis  Nephrologist: Dr. Theador Hawthorne  HPI: Trevor Crawford is a 70 y.o. male, who is here today for discussion of access for hemodialysis.  He is here today with his wife.  He has had progressive chronic renal insufficiency now stage IV.  This is felt to be due to hypertension and diabetes.  He is approaching need for hemodialysis and we are seeing him today for discussion of access.  He is not on any anticoagulants.  He is on Plavix.  He does not have a pacemaker.  Past Medical History:  Diagnosis Date   Arthritis    Cataract    bilateral   Chronic kidney disease    Depression    Diabetes mellitus without complication (HCC)    GERD (gastroesophageal reflux disease)    Glaucoma    Hyperlipidemia    Hypertension    Legally blind in right eye, as defined in [place]    Neuromuscular disorder ([place])    Neuropathy    Stroke ([place])     Family History  Problem Relation Age of Onset   Cancer Mother    Alcoholism Father    Cancer Father    Cancer Other    Cancer Sister    Diabetes Brother    Stroke Brother     SOCIAL HISTORY: Social History   Socioeconomic History   Marital status: Married    Spouse name: Not on file   Number of children: Not on file   Years of education: Not on file   Highest education level: Not on file  Occupational History   Not on file  Tobacco Use   Smoking status: Every Day    Packs/day: 1.00    Years: 55.00    Pack years: 55.00    Types: Cigarettes   Smokeless tobacco: Never  Substance and Sexual Activity   Alcohol use: No   Drug use: No   Sexual activity: Never    Birth control/protection: None  Other Topics Concern   Not on file  Social History Narrative   Not on file   Social Determinants of Health   Financial Resource Strain: Medium Risk    Difficulty of Paying Living Expenses: Somewhat hard  Food Insecurity: No Food Insecurity   Worried About Charity fundraiser in the Last Year: Never true   Ran Out of Food in the Last Year: Never true  Transportation Needs: No Transportation Needs   Lack of Transportation (Medical): No   Lack of Transportation (Non-Medical): No  Physical Activity: Inactive   Days of Exercise per Week: 0 days   Minutes of Exercise per Session: 0 min  Stress: No Stress Concern Present   Feeling of Stress : Not at all  Social Connections: Socially Isolated   Frequency of Communication with Friends and Family: Never   Frequency of Social Gatherings with Friends and Family: Never   Attends Religious Services: Never   Marine scientist or Organizations: No   Attends Music therapist: Never   Marital Status: Married  Human resources officer Violence: Not At Risk   Fear of Current or Ex-Partner: No   Emotionally Abused: No   Physically Abused: No   Sexually Abused: No    No Known Allergies  Current Outpatient Medications  Medication Sig Dispense Refill   amLODipine (NORVASC) 5 MG tablet Take  5 mg by mouth daily.     atorvastatin (LIPITOR) 80 MG tablet Take 1 tablet (80 mg total) at bedtime by mouth. 30 tablet 0   brimonidine (ALPHAGAN) 0.2 % ophthalmic solution Place 1 drop 2 (two) times daily into the right eye.      carboxymethylcellulose (REFRESH PLUS) 0.5 % SOLN Place 1 drop 4 (four) times daily into the right eye.      carvedilol (COREG) 3.125 MG tablet Take by mouth.     cholecalciferol (VITAMIN D) 1000 units tablet Take 1,000 Units 2 (two) times daily by mouth.     clopidogrel (PLAVIX) 75 MG tablet Take 1 tablet (75 mg total) daily by mouth. 30 tablet 0   Insulin Glargine (LANTUS SOLOSTAR) 100 UNIT/ML Solostar Pen Inject 29 Units into the skin daily. 15 mL 11   latanoprost (XALATAN) 0.005 % ophthalmic solution Place 1 drop at bedtime into the right eye.     aspirin EC 325 MG EC tablet  Take 1 tablet (325 mg total) daily by mouth. (Patient not taking: Reported on 02/15/2021) 30 tablet 0   dorzolamide-timolol (COSOPT) 22.3-6.8 MG/ML ophthalmic solution Place 1 drop 2 (two) times daily into the right eye.  (Patient not taking: Reported on 02/15/2021)     furosemide (LASIX) 40 MG tablet Take 40 mg 2 (two) times daily by mouth.  (Patient not taking: No sig reported)     ranitidine (ZANTAC) 150 MG tablet Take 150 mg every evening by mouth. (Patient not taking: No sig reported)     simethicone (MYLICON) 80 MG chewable tablet Chew 160 mg by mouth 2 (two) times daily as needed for flatulence. (Patient not taking: Reported on 02/15/2021)     tamsulosin (FLOMAX) 0.4 MG CAPS capsule Take 1 capsule (0.4 mg total) by mouth daily. (Patient not taking: No sig reported) 10 capsule 0   No current facility-administered medications for this visit.    REVIEW OF SYSTEMS:  [X]  denotes positive finding, [ ]  denotes negative finding Cardiac  Comments:  Chest pain or chest pressure:    Shortness of breath upon exertion:    Short of breath when lying flat:    Irregular heart rhythm:        Vascular    Pain in calf, thigh, or hip brought on by ambulation:    Pain in feet at night that wakes you up from your sleep:     Blood clot in your veins:    Leg swelling:  x       Pulmonary    Oxygen at home:    Productive cough:     Wheezing:         Neurologic    Sudden weakness in arms or legs:     Sudden numbness in arms or legs:  x   Sudden onset of difficulty speaking or slurred speech:    Temporary loss of vision in one eye:     Problems with dizziness:         Gastrointestinal    Blood in stool:     Vomited blood:         Genitourinary    Burning when urinating:     Blood in urine:        Psychiatric    Major depression:         Hematologic    Bleeding problems:    Problems with blood clotting too easily:        Skin    Rashes or ulcers:  Constitutional    Fever or  chills:      PHYSICAL EXAM: Vitals:   02/15/21 0934  BP: (!) 180/95  Pulse: 68  Temp: 98.2 F (36.8 C)  TempSrc: Oral  SpO2: 98%  Weight: 181 lb (82.1 kg)  Height: 6' (1.829 m)    GENERAL: The patient is a well-nourished male, in no acute distress. The vital signs are documented above. CARDIOVASCULAR: 2+ radial pulses bilaterally.  Small surface veins bilaterally PULMONARY: There is good air exchange  MUSCULOSKELETAL: There are no major deformities or cyanosis. NEUROLOGIC: No focal weakness or paresthesias are detected. SKIN: There are no ulcers or rashes noted. PSYCHIATRIC: The patient has a normal affect.  DATA:  Arterial and venous duplex reviewed.  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  Has triphasic flow at the radial artery level bilaterally.  Cephalic veins do not appear to be adequate for fistula creation bilaterally.  He does have a large caliber basilic vein at the antecubital space and moderate in the upper arm on the left.  MEDICAL ISSUES: Had a long discussion with the patient and his wife present.  Discussed options for hemodialysis to include tunneled hemodialysis catheter for acute dialysis, AV graft and AV fistula.  Discussed the benefits of all of these.  Also explained the near certain need for ongoing maintenance of any access.  I have recommended left for stage basilic vein transposition.  Explained the potential for not maturation.  Explained that we would see him at 4 to 6 weeks postoperative to repeat the duplex and determine his maturation of the for stage fistula.  I did explain the need for second stage transposition assuming he had adequate basilic vein fistula maturation.  We will schedule this at his convenience at [facility] on 02/21/2021   Rosetta Posner, MD [facility] Vascular and Vein Specialists of [facility] [phone] Pager [phone]  Note: Portions of this report may have been transcribed using voice  recognition software.  Every effort has been made to ensure accuracy; however, inadvertent computerized transcription errors may still be present.

## 2021-02-17 ENCOUNTER — Encounter (HOSPITAL_COMMUNITY)
Admission: RE | Admit: 2021-02-17 | Discharge: 2021-02-17 | Disposition: A | Discharge status: Home / Self Care | Payer: Medicare Other | Source: Ambulatory Visit | Attending: Vascular Surgery | Admitting: Vascular Surgery

## 2021-02-17 ENCOUNTER — Encounter (HOSPITAL_COMMUNITY): Payer: Self-pay

## 2021-02-17 ENCOUNTER — Other Ambulatory Visit: Payer: Self-pay

## 2021-02-21 ENCOUNTER — Other Ambulatory Visit: Payer: Self-pay

## 2021-02-21 ENCOUNTER — Ambulatory Visit (HOSPITAL_COMMUNITY): Payer: Medicare Other | Admitting: Anesthesiology

## 2021-02-21 ENCOUNTER — Ambulatory Visit (HOSPITAL_COMMUNITY)
Admission: RE | Admit: 2021-02-21 | Discharge: 2021-02-21 | Disposition: A | Discharge status: Home / Self Care | Payer: Medicare Other | Attending: Vascular Surgery | Admitting: Vascular Surgery

## 2021-02-21 ENCOUNTER — Encounter (HOSPITAL_COMMUNITY): Payer: Self-pay | Admitting: Vascular Surgery

## 2021-02-21 ENCOUNTER — Encounter (HOSPITAL_COMMUNITY)
Admission: RE | Disposition: A | Discharge status: Home / Self Care | Payer: Medicare Other | Source: Home / Self Care | Attending: Vascular Surgery

## 2021-02-21 DIAGNOSIS — N185 Chronic kidney disease, stage 5: Secondary | ICD-10-CM | POA: Diagnosis not present

## 2021-02-21 DIAGNOSIS — N184 Chronic kidney disease, stage 4 (severe): Secondary | ICD-10-CM | POA: Insufficient documentation

## 2021-02-21 DIAGNOSIS — E1122 Type 2 diabetes mellitus with diabetic chronic kidney disease: Secondary | ICD-10-CM | POA: Diagnosis not present

## 2021-02-21 DIAGNOSIS — I120 Hypertensive chronic kidney disease with stage 5 chronic kidney disease or end stage renal disease: Secondary | ICD-10-CM | POA: Diagnosis not present

## 2021-02-21 DIAGNOSIS — Z794 Long term (current) use of insulin: Secondary | ICD-10-CM | POA: Diagnosis not present

## 2021-02-21 DIAGNOSIS — Z7902 Long term (current) use of antithrombotics/antiplatelets: Secondary | ICD-10-CM | POA: Diagnosis not present

## 2021-02-21 DIAGNOSIS — N186 End stage renal disease: Secondary | ICD-10-CM | POA: Diagnosis not present

## 2021-02-21 DIAGNOSIS — I129 Hypertensive chronic kidney disease with stage 1 through stage 4 chronic kidney disease, or unspecified chronic kidney disease: Secondary | ICD-10-CM | POA: Insufficient documentation

## 2021-02-21 DIAGNOSIS — Z7982 Long term (current) use of aspirin: Secondary | ICD-10-CM | POA: Diagnosis not present

## 2021-02-21 DIAGNOSIS — F17210 Nicotine dependence, cigarettes, uncomplicated: Secondary | ICD-10-CM | POA: Diagnosis not present

## 2021-02-21 DIAGNOSIS — Z79899 Other long term (current) drug therapy: Secondary | ICD-10-CM | POA: Diagnosis not present

## 2021-02-21 DIAGNOSIS — N1832 Chronic kidney disease, stage 3b: Secondary | ICD-10-CM

## 2021-02-21 HISTORY — PX: AV FISTULA PLACEMENT: SHX1204

## 2021-02-21 LAB — POCT I-STAT, CHEM 8
BUN: 45 mg/dL — ABNORMAL HIGH (ref 8–23)
Calcium, Ion: 1.32 mmol/L (ref 1.15–1.40)
Chloride: 110 mmol/L (ref 98–111)
Creatinine, Ser: 4.8 mg/dL — ABNORMAL HIGH (ref 0.61–1.24)
Glucose, Bld: 73 mg/dL (ref 70–99)
HCT: 32 % — ABNORMAL LOW (ref 39.0–52.0)
Hemoglobin: 10.9 g/dL — ABNORMAL LOW (ref 13.0–17.0)
Potassium: 4.2 mmol/L (ref 3.5–5.1)
Sodium: 143 mmol/L (ref 135–145)
TCO2: 24 mmol/L (ref 22–32)

## 2021-02-21 LAB — GLUCOSE, CAPILLARY: Glucose-Capillary: 86 mg/dL (ref 70–99)

## 2021-02-21 SURGERY — ARTERIOVENOUS (AV) FISTULA CREATION
Anesthesia: General | Site: Arm Lower | Laterality: Left

## 2021-02-21 MED ORDER — OXYCODONE-ACETAMINOPHEN 5-325 MG PO TABS: 1.0000 | ORAL_TABLET | Freq: Four times a day (QID) | ORAL | 0 refills | Status: DC | PRN

## 2021-02-21 MED ORDER — PROPOFOL 10 MG/ML IV BOLUS
INTRAVENOUS | Status: AC
  Filled 2021-02-21: qty 20

## 2021-02-21 MED ORDER — LIDOCAINE-EPINEPHRINE 0.5 %-1:200000 IJ SOLN
INTRAMUSCULAR | Status: DC | PRN
  Administered 2021-02-21: 1 mL
  Administered 2021-02-21: 3 mL

## 2021-02-21 MED ORDER — FENTANYL CITRATE (PF) 100 MCG/2ML IJ SOLN: 25.0000 ug | INTRAMUSCULAR | Status: DC | PRN

## 2021-02-21 MED ORDER — PHENYLEPHRINE HCL-NACL 10-0.9 MG/250ML-% IV SOLN
INTRAVENOUS | Status: DC | PRN
  Administered 2021-02-21: 25 ug/min via INTRAVENOUS

## 2021-02-21 MED ORDER — SODIUM CHLORIDE 0.9 % IV SOLN: INTRAVENOUS | Status: DC

## 2021-02-21 MED ORDER — CHLORHEXIDINE GLUCONATE 4 % EX LIQD: 60.0000 mL | Freq: Once | CUTANEOUS | Status: DC

## 2021-02-21 MED ORDER — DEXTROSE 50 % IV SOLN
25.0000 mL | Freq: Once | INTRAVENOUS | Status: AC
  Administered 2021-02-21: 25 mL via INTRAVENOUS

## 2021-02-21 MED ORDER — ONDANSETRON HCL 4 MG/2ML IJ SOLN
INTRAMUSCULAR | Status: DC | PRN
Start: 2021-02-21 — End: 2021-02-21
  Administered 2021-02-21: 4 mg via INTRAVENOUS

## 2021-02-21 MED ORDER — DEXTROSE 50 % IV SOLN
INTRAVENOUS | Status: AC
  Filled 2021-02-21: qty 50

## 2021-02-21 MED ORDER — CEFAZOLIN SODIUM-DEXTROSE 2-4 GM/100ML-% IV SOLN
2.0000 g | INTRAVENOUS | Status: AC
  Administered 2021-02-21: 2 g via INTRAVENOUS

## 2021-02-21 MED ORDER — HEPARIN SODIUM (PORCINE) 1000 UNIT/ML IJ SOLN
INTRAMUSCULAR | Status: AC
  Filled 2021-02-21: qty 6

## 2021-02-21 MED ORDER — HEPARIN 6000 UNIT IRRIGATION SOLUTION
Status: DC | PRN
  Administered 2021-02-21: 1

## 2021-02-21 MED ORDER — ORAL CARE MOUTH RINSE: 15.0000 mL | Freq: Once | OROMUCOSAL | Status: AC

## 2021-02-21 MED ORDER — FENTANYL CITRATE (PF) 100 MCG/2ML IJ SOLN
INTRAMUSCULAR | Status: DC | PRN
  Administered 2021-02-21: 50 ug via INTRAVENOUS

## 2021-02-21 MED ORDER — EPHEDRINE 5 MG/ML INJ
INTRAVENOUS | Status: AC
  Filled 2021-02-21: qty 5

## 2021-02-21 MED ORDER — PROPOFOL 10 MG/ML IV BOLUS
INTRAVENOUS | Status: AC
  Filled 2021-02-21: qty 40

## 2021-02-21 MED ORDER — ONDANSETRON HCL 4 MG/2ML IJ SOLN
INTRAMUSCULAR | Status: AC
  Filled 2021-02-21: qty 2

## 2021-02-21 MED ORDER — PROPOFOL 10 MG/ML IV BOLUS
INTRAVENOUS | Status: DC | PRN
  Administered 2021-02-21: 20 mg via INTRAVENOUS

## 2021-02-21 MED ORDER — FENTANYL CITRATE (PF) 100 MCG/2ML IJ SOLN
INTRAMUSCULAR | Status: AC
  Filled 2021-02-21: qty 2

## 2021-02-21 MED ORDER — LIDOCAINE-EPINEPHRINE 0.5 %-1:200000 IJ SOLN
INTRAMUSCULAR | Status: AC
  Filled 2021-02-21: qty 1

## 2021-02-21 MED ORDER — CHLORHEXIDINE GLUCONATE 0.12 % MT SOLN
15.0000 mL | Freq: Once | OROMUCOSAL | Status: AC
  Administered 2021-02-21: 15 mL via OROMUCOSAL

## 2021-02-21 MED ORDER — CHLORHEXIDINE GLUCONATE 0.12 % MT SOLN
OROMUCOSAL | Status: AC
  Filled 2021-02-21: qty 15

## 2021-02-21 MED ORDER — CEFAZOLIN SODIUM-DEXTROSE 2-4 GM/100ML-% IV SOLN
INTRAVENOUS | Status: AC
  Filled 2021-02-21: qty 100

## 2021-02-21 MED ORDER — EPHEDRINE SULFATE 50 MG/ML IJ SOLN
INTRAMUSCULAR | Status: DC | PRN
  Administered 2021-02-21: 5 mg via INTRAVENOUS

## 2021-02-21 MED ORDER — ONDANSETRON HCL 4 MG/2ML IJ SOLN: 4.0000 mg | Freq: Once | INTRAMUSCULAR | Status: DC | PRN

## 2021-02-21 MED ORDER — PROPOFOL 500 MG/50ML IV EMUL
INTRAVENOUS | Status: DC | PRN
  Administered 2021-02-21: 100 ug/kg/min via INTRAVENOUS

## 2021-02-21 SURGICAL SUPPLY — 38 items
ADH SKN CLS APL DERMABOND .7 (GAUZE/BANDAGES/DRESSINGS) ×1
ARMBAND PINK RESTRICT EXTREMIT (MISCELLANEOUS) ×2 IMPLANT
BAG HAMPER (MISCELLANEOUS) ×2 IMPLANT
CANNULA VESSEL 3MM 2 BLNT TIP (CANNULA) ×2 IMPLANT
CLIP LIGATING EXTRA MED SLVR (CLIP) ×2 IMPLANT
CLIP LIGATING EXTRA SM BLUE (MISCELLANEOUS) ×2 IMPLANT
COVER LIGHT HANDLE STERIS (MISCELLANEOUS) ×4 IMPLANT
COVER MAYO STAND XLG (MISCELLANEOUS) ×2 IMPLANT
DECANTER SPIKE VIAL GLASS SM (MISCELLANEOUS) ×2 IMPLANT
DERMABOND ADVANCED (GAUZE/BANDAGES/DRESSINGS) ×1
DERMABOND ADVANCED .7 DNX12 (GAUZE/BANDAGES/DRESSINGS) ×1 IMPLANT
ELECT REM PT RETURN 9FT ADLT (ELECTROSURGICAL) ×2
ELECTRODE REM PT RTRN 9FT ADLT (ELECTROSURGICAL) ×1 IMPLANT
GAUZE SPONGE 4X4 12PLY STRL (GAUZE/BANDAGES/DRESSINGS) ×2 IMPLANT
GLOVE SS BIOGEL STRL SZ 7.5 (GLOVE) ×1 IMPLANT
GLOVE SUPERSENSE BIOGEL SZ 7.5 (GLOVE) ×1
GLOVE SURG UNDER POLY LF SZ7 (GLOVE) ×6 IMPLANT
GOWN STRL REUS W/TWL LRG LVL3 (GOWN DISPOSABLE) ×6 IMPLANT
IV NS 500ML (IV SOLUTION) ×2
IV NS 500ML BAXH (IV SOLUTION) ×1 IMPLANT
KIT BLADEGUARD II DBL (SET/KITS/TRAYS/PACK) ×2 IMPLANT
KIT TURNOVER KIT A (KITS) ×2 IMPLANT
MANIFOLD NEPTUNE II (INSTRUMENTS) ×2 IMPLANT
MARKER SKIN DUAL TIP RULER LAB (MISCELLANEOUS) ×4 IMPLANT
NEEDLE HYPO 18GX1.5 BLUNT FILL (NEEDLE) ×2 IMPLANT
PACK CV ACCESS (CUSTOM PROCEDURE TRAY) ×2 IMPLANT
PAD ARMBOARD 7.5X6 YLW CONV (MISCELLANEOUS) ×4 IMPLANT
SET BASIN LINEN APH (SET/KITS/TRAYS/PACK) ×2 IMPLANT
SOL PREP POV-IOD 4OZ 10% (MISCELLANEOUS) ×2 IMPLANT
SOL PREP PROV IODINE SCRUB 4OZ (MISCELLANEOUS) ×2 IMPLANT
SPONGE T-LAP 18X18 ~~LOC~~+RFID (SPONGE) ×2 IMPLANT
SUT PROLENE 6 0 CC (SUTURE) ×2 IMPLANT
SUT VIC AB 3-0 SH 27 (SUTURE) ×2
SUT VIC AB 3-0 SH 27X BRD (SUTURE) ×1 IMPLANT
SYR 10ML LL (SYRINGE) ×2 IMPLANT
SYR CONTROL 10ML LL (SYRINGE) ×2 IMPLANT
TOWEL OR 17X26 4PK STRL BLUE (TOWEL DISPOSABLE) ×2 IMPLANT
UNDERPAD 30X36 HEAVY ABSORB (UNDERPADS AND DIAPERS) ×2 IMPLANT

## 2021-02-21 NOTE — Anesthesia Preprocedure Evaluation (Addendum)
Anesthesia Evaluation  Patient identified by MRN, date of birth, ID band Patient awake    Reviewed: Allergy & Precautions, NPO status , Patient's Chart, lab work & pertinent test results, reviewed documented beta blocker date and time   History of Anesthesia Complications Negative for: history of anesthetic complications  Airway Mallampati: I  TM Distance: >3 FB Neck ROM: Full    Dental  (+) Edentulous Upper, Edentulous Lower   Pulmonary Current Smoker and Patient abstained from smoking.,    Pulmonary exam normal breath sounds clear to auscultation       Cardiovascular Exercise Tolerance: Poor hypertension, Pt. on medications and Pt. on home beta blockers Normal cardiovascular exam Rhythm:Regular Rate:Normal     Neuro/Psych PSYCHIATRIC DISORDERS Depression  Neuromuscular disease CVA, Residual Symptoms    GI/Hepatic GERD  ,  Endo/Other  diabetes, Well Controlled, Type 2, Insulin Dependent  Renal/GU ESRF and DialysisRenal disease     Musculoskeletal  (+) Arthritis ,   Abdominal   Peds  Hematology   Anesthesia Other Findings Legally blind  Reproductive/Obstetrics                           Anesthesia Physical Anesthesia Plan  ASA: 4  Anesthesia Plan: General   Post-op Pain Management:    Induction: Intravenous  PONV Risk Score and Plan: Propofol infusion  Airway Management Planned: Nasal Cannula and Natural Airway  Additional Equipment:   Intra-op Plan:   Post-operative Plan:   Informed Consent: I have reviewed the patients History and Physical, chart, labs and discussed the procedure including the risks, benefits and alternatives for the proposed anesthesia with the patient or authorized representative who has indicated his/her understanding and acceptance.       Plan Discussed with: CRNA and Surgeon  Anesthesia Plan Comments:         Anesthesia Quick Evaluation

## 2021-02-21 NOTE — Transfer of Care (Signed)
Immediate Anesthesia Transfer of Care Note  Patient: Trevor Crawford  Procedure(s) Performed: LEFT RADIOCEPHALIC ARTERIOVENOUS (AV) FISTULA CREATION (Left: Arm Lower)  Patient Location: PACU  Anesthesia Type:General  Level of Consciousness: awake, alert  and oriented  Airway & Oxygen Therapy: Patient Spontanous Breathing  Post-op Assessment: Report given to RN and Post -op Vital signs reviewed and stable  Post vital signs: Reviewed and stable  Last Vitals:  Vitals Value Taken Time  BP 146/83 02/21/21 1116  Temp    Pulse 65 02/21/21 1116  Resp 13 02/21/21  1116  SpO2 98 % 02/21/21 1116  Vitals shown include unvalidated device data.  Last Pain:  Vitals:   02/21/21 0839  TempSrc: Oral  PainSc: 0-No pain      Patients Stated Pain Goal: 4 (17/98/10 2548)  Complications: No notable events documented.

## 2021-02-21 NOTE — Interval H&P Note (Signed)
History and Physical Interval Note:  02/21/2021 8:52 AM  Trevor Crawford  has presented today for surgery, with the diagnosis of CHRONIC KIDNEY DISEASE STAGE IV.  The various methods of treatment have been discussed with the patient and family. After consideration of risks, benefits and other options for treatment, the patient has consented to  Procedure(s): LEFT ARM ARTERIOVENOUS (AV) FISTULA CREATION (Left) as a surgical intervention.  The patient's history has been reviewed, patient examined, no change in status, stable for surgery.  I have reviewed the patient's chart and labs.  Questions were answered to the patient's satisfaction.     Curt Jews

## 2021-02-21 NOTE — Discharge Instructions (Signed)
Vascular and Vein Specialists of [facility]  Discharge Instructions  AV Fistula or Graft Surgery for Dialysis Access  Please refer to the following instructions for your post-procedure care. Your surgeon or physician assistant will discuss any changes with you.  Activity  You may drive the day following your surgery, if you are comfortable and no longer taking prescription pain medication. Resume full activity as the soreness in your incision resolves.  Bathing/Showering  You may shower after you go home. Keep your incision dry for 48 hours. Do not soak in a bathtub, hot tub, or swim until the incision heals completely. You may not shower if you have a hemodialysis catheter.  Incision Care  Clean your incision with mild soap and water after 48 hours. Pat the area dry with a clean towel. You do not need a bandage unless otherwise instructed. Do not apply any ointments or creams to your incision. You may have skin glue on your incision. Do not peel it off. It will come off on its own in about one week. Your arm may swell a bit after surgery. To reduce swelling use pillows to elevate your arm so it is above your heart. Your doctor will tell you if you need to lightly wrap your arm with an ACE bandage.  Diet  Resume your normal diet. There are not special food restrictions following this procedure. In order to heal from your surgery, it is CRITICAL to get adequate nutrition. Your body requires vitamins, minerals, and protein. Vegetables are the best source of vitamins and minerals. Vegetables also provide the perfect balance of protein. Processed food has little nutritional value, so try to avoid this.  Medications  Resume taking all of your medications. If your incision is causing pain, you may take over-the counter pain relievers such as acetaminophen (Tylenol). If you were prescribed a stronger pain medication, please be aware these medications can cause nausea and constipation. Prevent  nausea by taking the medication with a snack or meal. Avoid constipation by drinking plenty of fluids and eating foods with high amount of fiber, such as fruits, vegetables, and grains.  Do not take Tylenol if you are taking prescription pain medications.  Follow up Your surgeon may want to see you in the office following your access surgery. If so, this will be arranged at the time of your surgery.  Please call us immediately for any of the following conditions:  Increased pain, redness, drainage (pus) from your incision site Fever of 101 degrees or higher Severe or worsening pain at your incision site Hand pain or numbness.  Reduce your risk of vascular disease:  Stop smoking. If you would like help, call [facility] at 1-800-QUIT-NOW 2043765007) or Elaine at [place] your cholesterol Maintain a desired weight Control your diabetes Keep your blood pressure down  Dialysis  It will take several weeks to several months for your new dialysis access to be ready for use. Your surgeon will determine when it is okay to use it. Your nephrologist will continue to direct your dialysis. You can continue to use your Permcath until your new access is ready for use.   02/21/2021 [place] [id number] 1950-12-18  Surgeon(s): Issiac Jamar, Arvilla Meres, MD  Procedure(s): LEFT RADIOCEPHALIC ARTERIOVENOUS (AV) FISTULA CREATION   May stick graft immediately   May stick graft on designated area only:    Do not stick fistula for 12 weeks    If you have any questions, please call the office at [phone].

## 2021-02-21 NOTE — Anesthesia Postprocedure Evaluation (Signed)
Anesthesia Post Note  Patient: Jveon H Riggin  Procedure(s) Performed: LEFT RADIOCEPHALIC ARTERIOVENOUS (AV) FISTULA CREATION (Left: Arm Lower)  Patient location during evaluation: PACU Anesthesia Type: General Level of consciousness: awake and alert and oriented Pain management: pain level controlled Vital Signs Assessment: post-procedure vital signs reviewed and stable Respiratory status: spontaneous breathing and respiratory function stable Cardiovascular status: blood pressure returned to baseline and stable Postop Assessment: no apparent nausea or vomiting Anesthetic complications: no   No notable events documented.   Last Vitals:  Vitals:   02/21/21 1145 02/21/21 1200  BP: (!) 155/57 (!) 148/72  Pulse: 60 64  Resp: 10 12  Temp:    SpO2: 98% 98%    Last Pain:  Vitals:   02/21/21 1200  TempSrc:   PainSc: 0-No pain                 Bibiana Gillean C Strider Vallance

## 2021-02-21 NOTE — Op Note (Signed)
    OPERATIVE REPORT  DATE OF SURGERY: 02/21/2021  PATIENT: Trevor Crawford, 70 y.o. male MRN: [id number]  DOB: 12-11-1950  PRE-OPERATIVE DIAGNOSIS: Chronic renal insufficiency  POST-OPERATIVE DIAGNOSIS:  Same  PROCEDURE: Left radiocephalic AV fistula creation  SURGEON:  Curt Jews, M.D.  PHYSICIAN ASSISTANT: Ovid Curd FA  The assistant was needed for exposure and to expedite the case  ANESTHESIA: Local with sedation  EBL: per anesthesia record  Total I/O In: 600 [I.V.:600] Out: 10 [Blood:10]  BLOOD ADMINISTERED: none  DRAINS: none  SPECIMEN: none  COUNTS CORRECT:  YES  PATIENT DISPOSITION:  PACU - hemodynamically stable  PROCEDURE DETAILS: The patient was taken to the operating placed supine position with area of the left arm prepped draped you sterile fashion.  The patient did have easily visible cephalic vein and with SonoSite this appeared to be adequate for fistula attempt.  Using local anesthesia incision was made between the level of the cephalic vein and the radial artery at the wrist.  The tributary branches were ligated with 4-0 silk ties and divided.  The vein was ligated distally and divided and was gently dilated with heparinized saline and was good caliber.  Radial artery was exposed through the same incision.  The radial artery is of normal size but did have extensive circumferential calcification.  The artery was controlled proximally and distally with red Vesseloops.  The artery was opened with an 11 blade and extended longitudinally with Potts scissors.  A 2-1/2 mm dilator passed through the artery.  The vein was cut to the appropriate length and was spatulated and sewn end-to-side to the artery with a running 6-0 Prolene suture.  Clamps were removed and excellent thrill was noted.  The wound irrigated with saline hemostasis electrocautery wound was closed with 3-0 Vicryl in the subcutaneous and subcuticular tissue.  Patient did have 1 prominent tributary  branch in the mid forearm.  Using local anesthesia a small incision was made over this and this tributary branch was ligated with 3-0 silk ties.  This wound was closed with a subcuticular Vicryl stitch.  Sterile dressing was applied and the patient was transferred to the recovery room in stable condition   Rosetta Posner, M.D., [facility] 02/21/2021 11:20 AM  Note: Portions of this report may have been transcribed using voice recognition software.  Every effort has been made to ensure accuracy; however, inadvertent computerized transcription errors may still be present.

## 2021-02-22 ENCOUNTER — Encounter (HOSPITAL_COMMUNITY): Payer: Self-pay | Admitting: Vascular Surgery

## 2021-02-22 LAB — GLUCOSE, CAPILLARY: Glucose-Capillary: 131 mg/dL — ABNORMAL HIGH (ref 70–99)

## 2021-02-26 NOTE — Progress Notes (Signed)
History of Present Illness: 70 yo male on chronic HD presents referred by Dr Theador Hawthorne for E/M of several abnormalities of GU tract seen on recent renal/pelvic ultrasounds.  Additionally, he has significant lower urinary tract symptoms.  IPSS 29, quality-of-life score 4.  He was on tamsulosin but this was stopped a while back.  He thinks he may have had some improvement of his urinary symptoms while on that.  He does have erectile dysfunction.  Last adequate intercourse for the patient and his wife was about a year ago.  He has not tried sildenafil.  He does have end-stage renal disease.  He will be starting dialysis in the near future.  He has a left upper extremity fistula.  4.8.2022: Renal U/S-- 1. Increased echogenicity within the kidneys, compatible with medical renal disease. No hydronephrosis. 2. 1.2 cm mildly complex septated left renal cyst, stable from previous. 3. Mild diffuse wall thickening about the partially distended bladder, nonspecific. Correlation with urinalysis recommended.   4.26.2022: Bladder U/S--Similar mild circumferential bladder wall thickening. Pre-void bladder volume of 296 cm^3. Post-void bladder volume of 282 ^3.   IMPRESSION: Similar bladder wall thickening with pre and post bladder volumes detailed above. Past Medical History:  Diagnosis Date   Arthritis    Cataract    bilateral   Chronic kidney disease    Depression    Diabetes mellitus without complication (HCC)    GERD (gastroesophageal reflux disease)    Glaucoma    Hyperlipidemia    Hypertension    Legally blind in right eye, as defined in [place]    Neuromuscular disorder ([place])    Neuropathy    Stroke [facility])     Past Surgical History:  Procedure Laterality Date   AV FISTULA PLACEMENT Left 02/21/2021   Procedure: LEFT RADIOCEPHALIC ARTERIOVENOUS (AV) FISTULA CREATION;  Surgeon: Rosetta Posner, MD;  Location: [facility];  Service: Vascular;  Laterality: Left;   CATARACT EXTRACTION Left     CYSTOSCOPY WITH RETROGRADE PYELOGRAM, URETEROSCOPY AND STENT PLACEMENT Left 10/06/2015   Procedure: CYSTOSCOPY WITH RETROGRADE PYELOGRAM, AND LEFT STENT PLACEMENT ;  Surgeon: Cleon Gustin, MD;  Location: [facility];  Service: Urology;  Laterality: Left;    Home Medications:  Allergies as of 02/28/2021   No Known Allergies      Medication List        Accurate as of February 26, 2021  1:04 PM. If you have any questions, ask your nurse or doctor.          amLODipine 5 MG tablet Commonly known as: NORVASC Take 5 mg by mouth daily.   aspirin 325 MG EC tablet Take 1 tablet (325 mg total) daily by mouth.   atorvastatin 80 MG tablet Commonly known as: LIPITOR Take 1 tablet (80 mg total) at bedtime by mouth.   carboxymethylcellulose 0.5 % Soln Commonly known as: REFRESH PLUS Place 1 drop into the right eye in the morning and at bedtime.   carvedilol 3.125 MG tablet Commonly known as: COREG Take 3.125 mg by mouth 2 (two) times daily with a meal.   cholecalciferol 1000 units tablet Commonly known as: VITAMIN D Take 3,000 Units by mouth daily.   clopidogrel 75 MG tablet Commonly known as: PLAVIX Take 1 tablet (75 mg total) daily by mouth.   insulin glargine 100 UNIT/ML Solostar Pen Commonly known as: Lantus SoloStar Inject 29 Units into the skin daily. What changed:  how much to take when to take this   oxyCODONE-acetaminophen 5-325 MG tablet  Commonly known as: Percocet Take 1 tablet by mouth every 6 (six) hours as needed for severe pain.   Refresh Lacri-Lube Oint Place 1 drop into both eyes at bedtime.   tamsulosin 0.4 MG Caps capsule Commonly known as: Flomax Take 1 capsule (0.4 mg total) by mouth daily.        Allergies: No Known Allergies  Family History  Problem Relation Age of Onset   Cancer Mother    Alcoholism Father    Cancer Father    Cancer Other    Cancer Sister    Diabetes Brother    Stroke Brother     Social History:  reports that he has  been smoking cigarettes. He has a 55.00 pack-year smoking history. He has never used smokeless tobacco. He reports that he does not drink alcohol and does not use drugs.  ROS: A complete review of systems was performed.  All systems are negative except for pertinent findings as noted.  Physical Exam:  Vital signs in last 24 hours: There were no vitals taken for this visit. Constitutional:  Alert and oriented, No acute distress Cardiovascular: Regular rate  Respiratory: Normal respiratory effort GI: Abdomen is soft, nontender, nondistended, no abdominal masses. No CVAT.  There are no inguinal hernias. Genitourinary: Normal male phallus, testes are descended bilaterally and non-tender and without masses, scrotum is normal in appearance without lesions or masses, perineum is normal on inspection.  Prostate 60 to 70 g, symmetric Lymphatic: No lymphadenopathy Neurologic: Grossly intact, no focal deficits Psychiatric: Normal mood and affect  I have reviewed prior pt notes  I have reviewed notes from referring/previous physicians  I have reviewed urinalysis results  I have independently reviewed prior imaging--CT images were reviewed  Bladder scan reviewed today 325 mL residual although the patient did not void in the office beforehand     Impression/Assessment:  1.  BPH with significant lower urinary tract symptoms.  Currently not on any medical therapy  2.  Erectile dysfunction, organic.  He has not tried medical therapy before.  I do not see him being on medications which would indicate use of sildenafil  3.  Other CT findings not terribly worrisome  Plan:  1.  I will restart him on tamsulosin as well is start him on finasteride  2.  Prescription for sildenafil given  3.  I will have him come back in about 2 months to recheck symptoms

## 2021-02-28 ENCOUNTER — Ambulatory Visit (INDEPENDENT_AMBULATORY_CARE_PROVIDER_SITE_OTHER): Payer: Medicare Other | Admitting: Urology

## 2021-02-28 ENCOUNTER — Other Ambulatory Visit: Payer: Self-pay

## 2021-02-28 ENCOUNTER — Encounter: Payer: Self-pay | Admitting: Urology

## 2021-02-28 VITALS — BP 156/67 | HR 71

## 2021-02-28 DIAGNOSIS — E118 Type 2 diabetes mellitus with unspecified complications: Secondary | ICD-10-CM | POA: Diagnosis not present

## 2021-02-28 DIAGNOSIS — R3915 Urgency of urination: Secondary | ICD-10-CM | POA: Diagnosis not present

## 2021-02-28 DIAGNOSIS — N138 Other obstructive and reflux uropathy: Secondary | ICD-10-CM | POA: Diagnosis not present

## 2021-02-28 DIAGNOSIS — R350 Frequency of micturition: Secondary | ICD-10-CM

## 2021-02-28 DIAGNOSIS — N401 Enlarged prostate with lower urinary tract symptoms: Secondary | ICD-10-CM

## 2021-02-28 DIAGNOSIS — E785 Hyperlipidemia, unspecified: Secondary | ICD-10-CM | POA: Diagnosis not present

## 2021-02-28 DIAGNOSIS — N281 Cyst of kidney, acquired: Secondary | ICD-10-CM | POA: Diagnosis not present

## 2021-02-28 DIAGNOSIS — R339 Retention of urine, unspecified: Secondary | ICD-10-CM | POA: Diagnosis not present

## 2021-02-28 DIAGNOSIS — N5201 Erectile dysfunction due to arterial insufficiency: Secondary | ICD-10-CM

## 2021-02-28 LAB — BLADDER SCAN AMB NON-IMAGING: Scan Result: 325

## 2021-02-28 MED ORDER — FINASTERIDE 5 MG PO TABS: 5.0000 mg | ORAL_TABLET | Freq: Every day | ORAL | 3 refills | Status: AC

## 2021-02-28 MED ORDER — TAMSULOSIN HCL 0.4 MG PO CAPS: 0.4000 mg | ORAL_CAPSULE | Freq: Every day | ORAL | 3 refills | Status: AC

## 2021-02-28 MED ORDER — SILDENAFIL CITRATE 100 MG PO TABS: ORAL_TABLET | ORAL | 99 refills | Status: AC

## 2021-02-28 NOTE — Progress Notes (Signed)
Urological Symptom Review PVR 325  Patient is experiencing the following symptoms: Frequent urination Hard to postpone urination Leakage of urine Stream starts and stops Blood in urine Urinary tract infection Erection problems (male only)   Review of Systems  Gastrointestinal (upper)  : Indigestion/heartburn  Gastrointestinal (lower) : Negative for lower GI symptoms  Constitutional : Negative for symptoms  Skin: Negative for skin symptoms  Eyes: Blurred vision  Ear/Nose/Throat : Negative for Ear/Nose/Throat symptoms  Hematologic/Lymphatic: Negative for Hematologic/Lymphatic symptoms  Cardiovascular : Leg swelling  Respiratory : Negative for respiratory symptoms  Endocrine: Excessive thirst  Musculoskeletal: Negative for musculoskeletal symptoms  Neurological: Dizziness  Psychologic: Negative for psychiatric symptoms

## 2021-03-15 DIAGNOSIS — R809 Proteinuria, unspecified: Secondary | ICD-10-CM | POA: Diagnosis not present

## 2021-03-15 DIAGNOSIS — E1129 Type 2 diabetes mellitus with other diabetic kidney complication: Secondary | ICD-10-CM | POA: Diagnosis not present

## 2021-03-15 DIAGNOSIS — E1122 Type 2 diabetes mellitus with diabetic chronic kidney disease: Secondary | ICD-10-CM | POA: Diagnosis not present

## 2021-03-15 DIAGNOSIS — N189 Chronic kidney disease, unspecified: Secondary | ICD-10-CM | POA: Diagnosis not present

## 2021-03-15 DIAGNOSIS — I129 Hypertensive chronic kidney disease with stage 1 through stage 4 chronic kidney disease, or unspecified chronic kidney disease: Secondary | ICD-10-CM | POA: Diagnosis not present

## 2021-03-16 DIAGNOSIS — N170 Acute kidney failure with tubular necrosis: Secondary | ICD-10-CM | POA: Diagnosis not present

## 2021-03-16 DIAGNOSIS — I129 Hypertensive chronic kidney disease with stage 1 through stage 4 chronic kidney disease, or unspecified chronic kidney disease: Secondary | ICD-10-CM | POA: Diagnosis not present

## 2021-03-16 DIAGNOSIS — D631 Anemia in chronic kidney disease: Secondary | ICD-10-CM | POA: Diagnosis not present

## 2021-03-16 DIAGNOSIS — R29898 Other symptoms and signs involving the musculoskeletal system: Secondary | ICD-10-CM | POA: Diagnosis not present

## 2021-03-16 DIAGNOSIS — E1122 Type 2 diabetes mellitus with diabetic chronic kidney disease: Secondary | ICD-10-CM | POA: Diagnosis not present

## 2021-03-16 DIAGNOSIS — N189 Chronic kidney disease, unspecified: Secondary | ICD-10-CM | POA: Diagnosis not present

## 2021-03-16 DIAGNOSIS — R809 Proteinuria, unspecified: Secondary | ICD-10-CM | POA: Diagnosis not present

## 2021-03-16 DIAGNOSIS — E1129 Type 2 diabetes mellitus with other diabetic kidney complication: Secondary | ICD-10-CM | POA: Diagnosis not present

## 2021-03-31 DIAGNOSIS — N184 Chronic kidney disease, stage 4 (severe): Secondary | ICD-10-CM | POA: Diagnosis not present

## 2021-03-31 DIAGNOSIS — E118 Type 2 diabetes mellitus with unspecified complications: Secondary | ICD-10-CM | POA: Diagnosis not present

## 2021-04-05 ENCOUNTER — Encounter: Payer: Medicare Other | Admitting: Vascular Surgery

## 2021-04-25 ENCOUNTER — Telehealth: Payer: Self-pay | Admitting: Psychiatry

## 2021-04-25 ENCOUNTER — Ambulatory Visit: Payer: No Typology Code available for payment source | Admitting: Psychiatry

## 2021-04-25 NOTE — Telephone Encounter (Signed)
Pt family member called, pt not able to come today due to a flat tire. Rescheduled appt 05/04/21.

## 2021-04-25 NOTE — Telephone Encounter (Signed)
Noted thank you

## 2021-04-25 NOTE — Progress Notes (Deleted)
[facility]  PATIENT: Trevor Crawford DOB: 09/12/1950  REFERRING CLINICIAN: Liana Gerold, MD HISTORY FROM: *** REASON FOR VISIT: ***   HISTORICAL  CHIEF COMPLAINT:  No chief complaint on file.   HISTORY OF PRESENT ILLNESS:  70 year old male with a history of ESRD, DM, HTN, multiple strokes, glaucoma who presents for evaluation of right hand weakness. ***  Last brain imaging was in 2018. MRA at that time showed mild right P1 narrowing, but no flow limiting stenosis. MRI showed global atrophy and multiple remote lacunar infarcts (bilateral thalami and pons) as well as an acute right pontine infarct. TTE and 30 day holter monitor were unremarkable.  Currently he takes ASA***, plavix***, statin***   OTHER MEDICAL CONDITIONS: ***   REVIEW OF SYSTEMS: Full 14 system review of systems performed and negative with exception of: ***  ALLERGIES: No Known Allergies  HOME MEDICATIONS: Outpatient Medications Prior to Visit  Medication Sig Dispense Refill   amLODipine (NORVASC) 5 MG tablet Take 5 mg by mouth daily.     aspirin EC 325 MG EC tablet Take 1 tablet (325 mg total) daily by mouth. 30 tablet 0   carboxymethylcellulose (REFRESH PLUS) 0.5 % SOLN Place 1 drop into the right eye in the morning and at bedtime.     carvedilol (COREG) 3.125 MG tablet Take 3.125 mg by mouth 2 (two) times daily with a meal.     finasteride (PROSCAR) 5 MG tablet Take 1 tablet (5 mg total) by mouth daily. 90 tablet 3   Insulin Glargine (LANTUS SOLOSTAR) 100 UNIT/ML Solostar Pen Inject 29 Units into the skin daily. (Patient taking differently: Inject 30 Units into the skin at bedtime.) 15 mL 11   sildenafil (VIAGRA) 100 MG tablet 1/2 to 1 tablet p.o. as needed 15 tablet 99   tamsulosin (FLOMAX) 0.4 MG CAPS capsule Take 1 capsule (0.4 mg total) by mouth daily after supper. 90 capsule 3   White Petrolatum-Mineral Oil (REFRESH LACRI-LUBE) OINT Place 1 drop into both eyes at bedtime.      No facility-administered medications prior to visit.    PAST MEDICAL HISTORY: Past Medical History:  Diagnosis Date   Arthritis    Cataract    bilateral   Chronic kidney disease    Depression    Diabetes mellitus without complication (HCC)    GERD (gastroesophageal reflux disease)    Glaucoma    Hyperlipidemia    Hypertension    Legally blind in right eye, as defined in [place]    Neuromuscular disorder ([place])    Neuropathy    Stroke ([place])     PAST SURGICAL HISTORY: Past Surgical History:  Procedure Laterality Date   AV FISTULA PLACEMENT Left 02/21/2021   Procedure: LEFT RADIOCEPHALIC ARTERIOVENOUS (AV) FISTULA CREATION;  Surgeon: Rosetta Posner, MD;  Location: [facility];  Service: Vascular;  Laterality: Left;   CATARACT EXTRACTION Left    CYSTOSCOPY WITH RETROGRADE PYELOGRAM, URETEROSCOPY AND STENT PLACEMENT Left 10/06/2015   Procedure: CYSTOSCOPY WITH RETROGRADE PYELOGRAM, AND LEFT STENT PLACEMENT ;  Surgeon: Cleon Gustin, MD;  Location: [facility];  Service: Urology;  Laterality: Left;    FAMILY HISTORY: Family History  Problem Relation Age of Onset   Cancer Mother    Alcoholism Father    Cancer Father    Cancer Other    Cancer Sister    Diabetes Brother    Stroke Brother     SOCIAL HISTORY: Social History   Socioeconomic History  Marital status: Married    Spouse name: Not on file   Number of children: Not on file   Years of education: Not on file   Highest education level: Not on file  Occupational History   Not on file  Tobacco Use   Smoking status: Every Day    Packs/day: 1.00    Years: 55.00    Pack years: 55.00    Types: Cigarettes   Smokeless tobacco: Never  Substance and Sexual Activity   Alcohol use: No   Drug use: No   Sexual activity: Never    Birth control/protection: None  Other Topics Concern   Not on file  Social History Narrative   Not on file   Social Determinants of Health   Financial Resource Strain: Medium Risk   Difficulty  of Paying Living Expenses: Somewhat hard  Food Insecurity: No Food Insecurity   Worried About Charity fundraiser in the Last Year: Never true   Ran Out of Food in the Last Year: Never true  Transportation Needs: No Transportation Needs   Lack of Transportation (Medical): No   Lack of Transportation (Non-Medical): No  Physical Activity: Inactive   Days of Exercise per Week: 0 days   Minutes of Exercise per Session: 0 min  Stress: No Stress Concern Present   Feeling of Stress : Not at all  Social Connections: Socially Isolated   Frequency of Communication with Friends and Family: Never   Frequency of Social Gatherings with Friends and Family: Never   Attends Religious Services: Never   Marine scientist or Organizations: No   Attends Music therapist: Never   Marital Status: Married  Human resources officer Violence: Not At Risk   Fear of Current or Ex-Partner: No   Emotionally Abused: No   Physically Abused: No   Sexually Abused: No     PHYSICAL EXAM  GENERAL EXAM/CONSTITUTIONAL: Vitals: There were no vitals filed for this visit. There is no height or weight on file to calculate BMI. Wt Readings from Last 3 Encounters:  02/21/21 182 lb (82.6 kg)  02/17/21 181 lb (82.1 kg)  02/15/21 181 lb (82.1 kg)   Patient is in no distress; well developed, nourished and groomed; neck is supple  CARDIOVASCULAR: Examination of carotid arteries is normal; no carotid bruits Regular rate and rhythm, no murmurs Examination of peripheral vascular system by observation and palpation is normal  EYES: Pupils round and reactive to light, Visual fields full to confrontation, Extraocular movements intacts,   MUSCULOSKELETAL: Gait, strength, tone, movements noted in Neurologic exam below  NEUROLOGIC: MENTAL STATUS:  No flowsheet data found. awake, alert, oriented to person, place and time recent and remote memory intact normal attention and concentration language fluent,  comprehension intact, naming intact fund of knowledge appropriate  CRANIAL NERVE:  2nd - no papilledema or hemorrhages on fundoscopic exam 2nd, 3rd, 4th, 6th - pupils equal and reactive to light, visual fields full to confrontation, extraocular muscles intact, no nystagmus 5th - facial sensation symmetric 7th - facial strength symmetric 8th - hearing intact 9th - palate elevates symmetrically, uvula midline 11th - shoulder shrug symmetric 12th - tongue protrusion midline  MOTOR:  normal bulk and tone, full strength in the BUE, BLE  SENSORY:  normal and symmetric to light touch, pinprick, temperature, vibration  COORDINATION:  finger-nose-finger, fine finger movements normal  REFLEXES:  deep tendon reflexes present and symmetric  GAIT/STATION:  normal     DIAGNOSTIC DATA (LABS, IMAGING, TESTING) -  I reviewed patient records, labs, notes, testing and imaging myself where available.  Lab Results  Component Value Date   WBC 7.2 01/03/2021   HGB 10.9 (L) 02/21/2021   HCT 32.0 (L) 02/21/2021   MCV 92.1 01/03/2021   PLT 237 01/03/2021      Component Value Date/Time   NA 143 02/21/2021 0838   NA 142 07/02/2017 1141   K 4.2 02/21/2021 0838   CL 110 02/21/2021 0838   CO2 22 01/03/2021 0935   GLUCOSE 73 02/21/2021 0838   BUN 45 (H) 02/21/2021 0838   BUN 41 (H) 07/02/2017 1141   CREATININE 4.80 (H) 02/21/2021 0838   CALCIUM 9.9 01/03/2021 0935   PROT 7.4 01/03/2021 0935   PROT 6.9 04/11/2017 1527   ALBUMIN 3.7 01/03/2021 0935   ALBUMIN 3.9 04/11/2017 1527   AST 15 01/03/2021 0935   ALT 25 01/03/2021 0935   ALKPHOS 79 01/03/2021 0935   BILITOT 0.5 01/03/2021 0935   BILITOT <0.2 04/11/2017 1527   GFRNONAA 17 (L) 01/03/2021 0935   GFRAA 32 (L) 07/02/2017 1141   Lab Results  Component Value Date   CHOL 121 06/24/2017   HDL 32 (L) 06/24/2017   LDLCALC 73 06/24/2017   TRIG 78 06/24/2017   CHOLHDL 3.8 06/24/2017   Lab Results  Component Value Date   HGBA1C 9.4  (H) 06/23/2017   Lab Results  Component Value Date   EYCXKGYJ85 631 04/05/2017   Lab Results  Component Value Date   TSH 1.473 04/05/2017    ***    ASSESSMENT AND PLAN  70 y.o. year old male with ***   No diagnosis found.    PLAN:   No orders of the defined types were placed in this encounter.   No orders of the defined types were placed in this encounter.   No follow-ups on file.    Genia Harold, MD  [facility] [address] [place], [place] [address] [phone]

## 2021-04-27 NOTE — Progress Notes (Signed)
[facility]  PATIENT: Trevor Crawford DOB: [birth year]  REFERRING CLINICIAN: Liana Gerold, MD HISTORY FROM: wife REASON FOR VISIT: right hand weakness, memory issues   HISTORICAL  CHIEF COMPLAINT:  Chief Complaint  Patient presents with   Extremity Weakness    RM 8 with spouse April  PT Is well, has been having weakness in R hand for about 5-6 months. Has had stroke in the past but not recently.     HISTORY OF PRESENT ILLNESS:  The patient presents for evaluation of right hand weakness and memory issues. Hand weakness has been present for the past 6 months. States it gradually got weaker over time. It is not painful, but he does report chronic numbness in his fingertips. Denies neck pain.  Memory issues have been going on for the past year. Wife notes he has been more irritable and has been having angry outbursts, which he will then forget about. He is easily frustrated. Has difficulty remembering peoples names, dates, and conversations he's had. Wife has had to start taking over shopping, cooking, and finances over the past 5 years. He is legally blind as does not drive. Memory has been worsening over time.  Denies trouble sleeping at night.   Last brain imaging was in 2018. MRA at that time showed mild right P1 narrowing, but no flow limiting stenosis. MRI showed global atrophy and multiple remote lacunar infarcts (bilateral thalami and pons) as well as an acute right pontine infarct. TTE and 30 day holter monitor were unremarkable.   Currently he takes plavix 75 mg daily and atorvastatin 80 mg daily for stroke prevention.  Wife notes he had head trauma when he was in the [employer] in [place] about 20 years ago.  OTHER MEDICAL CONDITIONS: ESRD, MGUS, BPH, HTN, DM, multiple lacunar strokes   REVIEW OF SYSTEMS: Full 14 system review of systems performed and negative with exception of: right hand weakness, memory loss  ALLERGIES: No Known Allergies  HOME  MEDICATIONS: Outpatient Medications Prior to Visit  Medication Sig Dispense Refill   amLODipine (NORVASC) 5 MG tablet Take 5 mg by mouth daily.     aspirin EC 325 MG EC tablet Take 1 tablet (325 mg total) daily by mouth. 30 tablet 0   carboxymethylcellulose (REFRESH PLUS) 0.5 % SOLN Place 1 drop into the right eye in the morning and at bedtime.     carvedilol (COREG) 3.125 MG tablet Take 3.125 mg by mouth 2 (two) times daily with a meal.     finasteride (PROSCAR) 5 MG tablet Take 1 tablet (5 mg total) by mouth daily. 90 tablet 3   Insulin Glargine (LANTUS SOLOSTAR) 100 UNIT/ML Solostar Pen Inject 29 Units into the skin daily. (Patient taking differently: Inject 30 Units into the skin at bedtime.) 15 mL 11   sildenafil (VIAGRA) 100 MG tablet 1/2 to 1 tablet p.o. as needed 15 tablet 99   tamsulosin (FLOMAX) 0.4 MG CAPS capsule Take 1 capsule (0.4 mg total) by mouth daily after supper. 90 capsule 3   White Petrolatum-Mineral Oil (REFRESH LACRI-LUBE) OINT Place 1 drop into both eyes at bedtime.     No facility-administered medications prior to visit.    PAST MEDICAL HISTORY: Past Medical History:  Diagnosis Date   Arthritis    Cataract    bilateral   Chronic kidney disease    Depression    Diabetes mellitus without complication (HCC)    GERD (gastroesophageal reflux disease)    Glaucoma  Hyperlipidemia    Hypertension    Legally blind in right eye, as defined in [place]    Neuromuscular disorder ([place])    Neuropathy    Stroke ([place])     PAST SURGICAL HISTORY: Past Surgical History:  Procedure Laterality Date   AV FISTULA PLACEMENT Left 02/21/2021   Procedure: LEFT RADIOCEPHALIC ARTERIOVENOUS (AV) FISTULA CREATION;  Surgeon: Rosetta Posner, MD;  Location: [facility];  Service: Vascular;  Laterality: Left;   CATARACT EXTRACTION Left    CYSTOSCOPY WITH RETROGRADE PYELOGRAM, URETEROSCOPY AND STENT PLACEMENT Left 10/06/2015   Procedure: CYSTOSCOPY WITH RETROGRADE PYELOGRAM, AND LEFT STENT  PLACEMENT ;  Surgeon: Cleon Gustin, MD;  Location: [facility];  Service: Urology;  Laterality: Left;    FAMILY HISTORY: Family History  Problem Relation Age of Onset   Cancer Mother    Alcoholism Father    Cancer Father    Cancer Other    Cancer Sister    Diabetes Brother    Stroke Brother     SOCIAL HISTORY: Social History   Socioeconomic History   Marital status: Married    Spouse name: Not on file   Number of children: Not on file   Years of education: Not on file   Highest education level: Not on file  Occupational History   Not on file  Tobacco Use   Smoking status: Every Day    Packs/day: 1.00    Years: 55.00    Pack years: 55.00    Types: Cigarettes   Smokeless tobacco: Never  Substance and Sexual Activity   Alcohol use: No   Drug use: No   Sexual activity: Never    Birth control/protection: None  Other Topics Concern   Not on file  Social History Narrative   Not on file   Social Determinants of Health   Financial Resource Strain: Medium Risk   Difficulty of Paying Living Expenses: Somewhat hard  Food Insecurity: No Food Insecurity   Worried About Charity fundraiser in the Last Year: Never true   Ran Out of Food in the Last Year: Never true  Transportation Needs: No Transportation Needs   Lack of Transportation (Medical): No   Lack of Transportation (Non-Medical): No  Physical Activity: Inactive   Days of Exercise per Week: 0 days   Minutes of Exercise per Session: 0 min  Stress: No Stress Concern Present   Feeling of Stress : Not at all  Social Connections: Socially Isolated   Frequency of Communication with Friends and Family: Never   Frequency of Social Gatherings with Friends and Family: Never   Attends Religious Services: Never   Marine scientist or Organizations: No   Attends Music therapist: Never   Marital Status: Married  Human resources officer Violence: Not At Risk   Fear of Current or Ex-Partner: No   Emotionally  Abused: No   Physically Abused: No   Sexually Abused: No     PHYSICAL EXAM  GENERAL EXAM/CONSTITUTIONAL: Vitals:  Vitals:   05/01/21 0845  BP: (!) 182/90  Pulse: 74  Weight: 175 lb (79.4 kg)  Height: 6\' 1"  (1.854 m)   Body mass index is 23.09 kg/m. Wt Readings from Last 3 Encounters:  05/01/21 175 lb (79.4 kg)  02/21/21 182 lb (82.6 kg)  02/17/21 181 lb (82.1 kg)   Patient is alert, in no distress  CARDIOVASCULAR: Regular rate and rhythm, no murmurs Examination of peripheral vascular system by observation and palpation is normal. Thrill palpated  over left AV fistula.  EYES: Blind in both eyes at baseline  MUSCULOSKELETAL: Gait, strength, tone, movements noted in Neurologic exam below  NEUROLOGIC: MENTAL STATUS:  MOCA: limited by poor vision (9 out of max 22) awake, alert, oriented to person and place, not day Most questions answered by wife. Patient responds mostly in yes or no answers Repetition intact   CRANIAL NERVE:  2nd, 3rd, 4th, 6th - left pupil minimally reactive to light, finger counting intact all 4 quadrants OS, no finger counting OD (blind at baseline), extraocular muscles intact, no nystagmus 5th - facial sensation symmetric 7th - edentulous, some facial asymmetry possibly secondary to lack of teeth 8th - hearing intact 9th - palate elevates symmetrically, uvula midline 11th - shoulder shrug symmetric 12th - tongue protrusion midline  MOTOR:  Atrophy noted in bilateral upper extremities, most notable in right hand. Strength 5/5 other than right hand grip and finger abduction 4/5  SENSORY:  Diminished sensation to pin prick in fingertips bilaterally and right C6-7 distribution  COORDINATION:  finger-nose-finger intact  REFLEXES:  deep tendon reflexes brisk and symmetric  GAIT/STATION:  Walks with rollator     DIAGNOSTIC DATA (LABS, IMAGING, TESTING) - I reviewed patient records, labs, notes, testing and imaging myself where  available.  Lab Results  Component Value Date   WBC 7.2 01/03/2021   HGB 10.9 (L) 02/21/2021   HCT 32.0 (L) 02/21/2021   MCV 92.1 01/03/2021   PLT 237 01/03/2021      Component Value Date/Time   NA 143 02/21/2021 0838   NA 142 07/02/2017 1141   K 4.2 02/21/2021 0838   CL 110 02/21/2021 0838   CO2 22 01/03/2021 0935   GLUCOSE 73 02/21/2021 0838   BUN 45 (H) 02/21/2021 0838   BUN 41 (H) 07/02/2017 1141   CREATININE 4.80 (H) 02/21/2021 0838   CALCIUM 9.9 01/03/2021 0935   PROT 7.4 01/03/2021 0935   PROT 6.9 04/11/2017 1527   ALBUMIN 3.7 01/03/2021 0935   ALBUMIN 3.9 04/11/2017 1527   AST 15 01/03/2021 0935   ALT 25 01/03/2021 0935   ALKPHOS 79 01/03/2021 0935   BILITOT 0.5 01/03/2021 0935   BILITOT <0.2 04/11/2017 1527   GFRNONAA 17 (L) 01/03/2021 0935   GFRAA 32 (L) 07/02/2017 1141   Lab Results  Component Value Date   CHOL 121 06/24/2017   HDL 32 (L) 06/24/2017   LDLCALC 73 06/24/2017   TRIG 78 06/24/2017   CHOLHDL 3.8 06/24/2017   Lab Results  Component Value Date   HGBA1C 9.4 (H) 06/23/2017   Lab Results  Component Value Date   FMBWGYKZ99 357 04/05/2017   Lab Results  Component Value Date   TSH 1.473 04/05/2017      ASSESSMENT AND PLAN  70 y.o. year old male with a history of ESRD, MGUS, BPH, HTN, DM, multiple lacunar strokes who presents for evaluation of progressive right hand weakness and memory loss. He does have evidence of neuropathy on exam, likely either from his diabetes or MGUS. MOCA limited due to poor vision, however he did exhibit deficits particularly with memory and attention. Will refer for neuropsychologic testing for a more comprehensive memory evaluation. MRI brain ordered to assess both for new stroke as well as other underlying structural causes for memory loss/new weakness. MRI C-spine ordered to assess for spinal/foraminal stenosis as a possible cause of progressive weakness.   1. Memory loss   2. Hand weakness        PLAN: -MRI brain  and C-spine (without contrast due to hx of ESRD not on dialysis) -Vitamin B12 and TSH -Referral to neuropsychological testing -Next steps: consider EMG if imaging does not reveal cause for weakness  Orders Placed This Encounter  Procedures   MR BRAIN WO CONTRAST   MR CERVICAL SPINE WO CONTRAST   B12 and Folate Panel   Thyroid Panel With TSH   Neuropsychological assessment      Return in about 3 months (around 07/31/2021).    Genia Harold, MD 05/01/21 10:02 AM  [facility] [address], [place], [place] [address] [phone]

## 2021-05-01 ENCOUNTER — Ambulatory Visit (INDEPENDENT_AMBULATORY_CARE_PROVIDER_SITE_OTHER): Payer: Medicare Other | Admitting: Psychiatry

## 2021-05-01 ENCOUNTER — Encounter: Payer: Self-pay | Admitting: Psychiatry

## 2021-05-01 VITALS — BP 182/90 | HR 74 | Ht 73.0 in | Wt 175.0 lb

## 2021-05-01 DIAGNOSIS — R413 Other amnesia: Secondary | ICD-10-CM | POA: Diagnosis not present

## 2021-05-01 DIAGNOSIS — R29898 Other symptoms and signs involving the musculoskeletal system: Secondary | ICD-10-CM

## 2021-05-01 DIAGNOSIS — I10 Essential (primary) hypertension: Secondary | ICD-10-CM | POA: Diagnosis not present

## 2021-05-01 DIAGNOSIS — E118 Type 2 diabetes mellitus with unspecified complications: Secondary | ICD-10-CM | POA: Diagnosis not present

## 2021-05-01 NOTE — Patient Instructions (Signed)
Plan: 1.MRI brain and neck 2. Neuropsychological testing 3. Blood work

## 2021-05-02 ENCOUNTER — Ambulatory Visit: Payer: Medicare Other | Admitting: Urology

## 2021-05-02 LAB — THYROID PANEL WITH TSH
Free Thyroxine Index: 2.8 (ref 1.2–4.9)
T3 Uptake Ratio: 33 % (ref 24–39)
T4, Total: 8.4 ug/dL (ref 4.5–12.0)
TSH: 1.72 u[IU]/mL (ref 0.450–4.500)

## 2021-05-02 LAB — B12 AND FOLATE PANEL
Folate: 12.2 ng/mL (ref >3.0)
Vitamin B-12: 595 pg/mL (ref 232–1245)

## 2021-05-04 ENCOUNTER — Telehealth: Payer: Self-pay | Admitting: Psychiatry

## 2021-05-04 ENCOUNTER — Ambulatory Visit: Payer: No Typology Code available for payment source | Admitting: Psychiatry

## 2021-05-04 NOTE — Telephone Encounter (Signed)
[facility] medicare/medicaid order sent to [facility], [facility] they will reach out to the patient to schedule.

## 2021-05-13 ENCOUNTER — Encounter (HOSPITAL_COMMUNITY): Payer: Self-pay | Admitting: *Deleted

## 2021-05-13 ENCOUNTER — Emergency Department (HOSPITAL_COMMUNITY): Payer: Medicare Other

## 2021-05-13 ENCOUNTER — Other Ambulatory Visit: Payer: Self-pay

## 2021-05-13 ENCOUNTER — Emergency Department (HOSPITAL_COMMUNITY)
Admission: EM | Admit: 2021-05-13 | Discharge: 2021-05-13 | Disposition: A | Discharge status: Home / Self Care | Payer: Medicare Other | Attending: Emergency Medicine | Admitting: Emergency Medicine

## 2021-05-13 DIAGNOSIS — Z794 Long term (current) use of insulin: Secondary | ICD-10-CM | POA: Diagnosis not present

## 2021-05-13 DIAGNOSIS — I120 Hypertensive chronic kidney disease with stage 5 chronic kidney disease or end stage renal disease: Secondary | ICD-10-CM | POA: Diagnosis not present

## 2021-05-13 DIAGNOSIS — I499 Cardiac arrhythmia, unspecified: Secondary | ICD-10-CM | POA: Diagnosis not present

## 2021-05-13 DIAGNOSIS — R81 Glycosuria: Secondary | ICD-10-CM | POA: Insufficient documentation

## 2021-05-13 DIAGNOSIS — F17210 Nicotine dependence, cigarettes, uncomplicated: Secondary | ICD-10-CM | POA: Diagnosis not present

## 2021-05-13 DIAGNOSIS — Z20822 Contact with and (suspected) exposure to covid-19: Secondary | ICD-10-CM | POA: Diagnosis not present

## 2021-05-13 DIAGNOSIS — R404 Transient alteration of awareness: Secondary | ICD-10-CM

## 2021-05-13 DIAGNOSIS — Z743 Need for continuous supervision: Secondary | ICD-10-CM | POA: Diagnosis not present

## 2021-05-13 DIAGNOSIS — R4781 Slurred speech: Secondary | ICD-10-CM | POA: Insufficient documentation

## 2021-05-13 DIAGNOSIS — Z7982 Long term (current) use of aspirin: Secondary | ICD-10-CM | POA: Insufficient documentation

## 2021-05-13 DIAGNOSIS — M6281 Muscle weakness (generalized): Secondary | ICD-10-CM | POA: Insufficient documentation

## 2021-05-13 DIAGNOSIS — D649 Anemia, unspecified: Secondary | ICD-10-CM | POA: Diagnosis not present

## 2021-05-13 DIAGNOSIS — Z79899 Other long term (current) drug therapy: Secondary | ICD-10-CM | POA: Diagnosis not present

## 2021-05-13 DIAGNOSIS — N186 End stage renal disease: Secondary | ICD-10-CM | POA: Insufficient documentation

## 2021-05-13 DIAGNOSIS — R0902 Hypoxemia: Secondary | ICD-10-CM | POA: Diagnosis not present

## 2021-05-13 DIAGNOSIS — E119 Type 2 diabetes mellitus without complications: Secondary | ICD-10-CM | POA: Diagnosis not present

## 2021-05-13 DIAGNOSIS — R531 Weakness: Secondary | ICD-10-CM | POA: Diagnosis not present

## 2021-05-13 LAB — CBC
HCT: 34.7 % — ABNORMAL LOW (ref 39.0–52.0)
Hemoglobin: 11 g/dL — ABNORMAL LOW (ref 13.0–17.0)
MCH: 28.9 pg (ref 26.0–34.0)
MCHC: 31.7 g/dL (ref 30.0–36.0)
MCV: 91.3 fL (ref 80.0–100.0)
Platelets: 219 10*3/uL (ref 150–400)
RBC: 3.8 MIL/uL — ABNORMAL LOW (ref 4.22–5.81)
RDW: 13.2 % (ref 11.5–15.5)
WBC: 7.2 10*3/uL (ref 4.0–10.5)
nRBC: 0 % (ref 0.0–0.2)

## 2021-05-13 LAB — COMPREHENSIVE METABOLIC PANEL
ALT: 14 U/L (ref 0–44)
AST: 12 U/L — ABNORMAL LOW (ref 15–41)
Albumin: 3.7 g/dL (ref 3.5–5.0)
Alkaline Phosphatase: 97 U/L (ref 38–126)
Anion gap: 8 (ref 5–15)
BUN: 47 mg/dL — ABNORMAL HIGH (ref 8–23)
CO2: 23 mmol/L (ref 22–32)
Calcium: 9.8 mg/dL (ref 8.9–10.3)
Chloride: 108 mmol/L (ref 98–111)
Creatinine, Ser: 4.93 mg/dL — ABNORMAL HIGH (ref 0.61–1.24)
GFR, Estimated: 12 mL/min — ABNORMAL LOW (ref >60)
Glucose, Bld: 168 mg/dL — ABNORMAL HIGH (ref 70–99)
Potassium: 4.9 mmol/L (ref 3.5–5.1)
Sodium: 139 mmol/L (ref 135–145)
Total Bilirubin: 0.7 mg/dL (ref 0.3–1.2)
Total Protein: 7.1 g/dL (ref 6.5–8.1)

## 2021-05-13 LAB — URINALYSIS, ROUTINE W REFLEX MICROSCOPIC
Bacteria, UA: NONE SEEN
Bilirubin Urine: NEGATIVE
Glucose, UA: 150 mg/dL — AB
Hgb urine dipstick: NEGATIVE
Ketones, ur: NEGATIVE mg/dL
Leukocytes,Ua: NEGATIVE
Nitrite: NEGATIVE
Protein, ur: >=300 mg/dL — AB
Specific Gravity, Urine: 1.013 (ref 1.005–1.030)
pH: 5 (ref 5.0–8.0)

## 2021-05-13 LAB — DIFFERENTIAL
Abs Immature Granulocytes: 0.02 10*3/uL (ref 0.00–0.07)
Basophils Absolute: 0 10*3/uL (ref 0.0–0.1)
Basophils Relative: 0 %
Eosinophils Absolute: 0.2 10*3/uL (ref 0.0–0.5)
Eosinophils Relative: 3 %
Immature Granulocytes: 0 %
Lymphocytes Relative: 16 %
Lymphs Abs: 1.2 10*3/uL (ref 0.7–4.0)
Monocytes Absolute: 0.6 10*3/uL (ref 0.1–1.0)
Monocytes Relative: 8 %
Neutro Abs: 5.2 10*3/uL (ref 1.7–7.7)
Neutrophils Relative %: 73 %

## 2021-05-13 LAB — I-STAT CHEM 8, ED
BUN: 45 mg/dL — ABNORMAL HIGH (ref 8–23)
Calcium, Ion: 1.25 mmol/L (ref 1.15–1.40)
Chloride: 111 mmol/L (ref 98–111)
Creatinine, Ser: 5.2 mg/dL — ABNORMAL HIGH (ref 0.61–1.24)
Glucose, Bld: 164 mg/dL — ABNORMAL HIGH (ref 70–99)
HCT: 33 % — ABNORMAL LOW (ref 39.0–52.0)
Hemoglobin: 11.2 g/dL — ABNORMAL LOW (ref 13.0–17.0)
Potassium: 4.9 mmol/L (ref 3.5–5.1)
Sodium: 141 mmol/L (ref 135–145)
TCO2: 24 mmol/L (ref 22–32)

## 2021-05-13 LAB — RESP PANEL BY RT-PCR (FLU A&B, COVID) ARPGX2
Influenza A by PCR: NEGATIVE
Influenza B by PCR: NEGATIVE
SARS Coronavirus 2 by RT PCR: NEGATIVE

## 2021-05-13 LAB — PROTIME-INR
INR: 0.9 (ref 0.8–1.2)
Prothrombin Time: 12.6 seconds (ref 11.4–15.2)

## 2021-05-13 LAB — RAPID URINE DRUG SCREEN, HOSP PERFORMED
Amphetamines: NOT DETECTED
Barbiturates: NOT DETECTED
Benzodiazepines: NOT DETECTED
Cocaine: POSITIVE — AB
Opiates: NOT DETECTED
Tetrahydrocannabinol: NOT DETECTED

## 2021-05-13 LAB — APTT: aPTT: 26 seconds (ref 24–36)

## 2021-05-13 LAB — ETHANOL: Alcohol, Ethyl (B): <10 mg/dL (ref <10)

## 2021-05-13 LAB — AMMONIA: Ammonia: 19 umol/L (ref 9–35)

## 2021-05-13 NOTE — Discharge Instructions (Addendum)
Get help right away if you: Feel that you are not able to care for yourself. Develop severe headaches, repeated vomiting, seizures, blackouts, or slurred speech. Have increasing confusion, weakness, numbness, restlessness, or personality changes. Develop a loss of balance, have marked dizziness, feel uncoordinated, or fall. Develop severe anxiety, or you have delusions or hallucinations.

## 2021-05-13 NOTE — ED Provider Notes (Signed)
[facility] EMERGENCY DEPARTMENT Provider Note   CSN: [id number] Arrival date & time: 05/13/21  1313     History Chief Complaint  Patient presents with   Aphasia    Trevor Crawford is a 70 y.o. male  with a hx of multiple strokes with Left sided hemiparesis. He presents today with a cc of slurred speech. He has ESRD, Blindness, DM, HTN, HLD, and is a daily smoker. EMS reports that the patient's S/O returned today from a weak long vacation and noticed that his speech was slurred. Unknown time of onset. Patient is a very poor historian and tells me that he is unsure of why he is here.   HPI     Past Medical History:  Diagnosis Date   Arthritis    Cataract    bilateral   Chronic kidney disease    Depression    Diabetes mellitus without complication (HCC)    GERD (gastroesophageal reflux disease)    Glaucoma    Hyperlipidemia    Hypertension    Legally blind in right eye, as defined in [place]    Neuromuscular disorder ([place])    Neuropathy    Stroke [facility])     Patient Active Problem List   Diagnosis Date Noted   MGUS (monoclonal gammopathy of unknown significance) 07/12/2020   Smoker    History of stroke    Cerebral thrombosis with cerebral infarction 06/23/2017   Left-sided weakness 06/22/2017   Late effects of CVA (cerebrovascular accident) 04/11/2017   CVA (cerebral vascular accident) ([place]) 04/04/2017   CKD (chronic kidney disease), stage III ([place]) 04/04/2017   Acute CVA (cerebrovascular accident) ([place]) 11/21/2016   Glaucoma 11/21/2016   Hyperlipidemia 11/21/2016   GERD (gastroesophageal reflux disease) 11/21/2016   Depression 05/07/2016   HTN (hypertension) 05/07/2016   Blindness 05/07/2016   BPH (benign prostatic hyperplasia) 05/07/2016   T2DM (type 2 diabetes mellitus) ([place]) 05/07/2016   Renal calculi 10/06/2015   Ureteral calculus, left 10/05/2015    Past Surgical History:  Procedure Laterality Date   AV FISTULA PLACEMENT Left 02/21/2021   Procedure: LEFT  RADIOCEPHALIC ARTERIOVENOUS (AV) FISTULA CREATION;  Surgeon: Rosetta Posner, MD;  Location: [facility];  Service: Vascular;  Laterality: Left;   CATARACT EXTRACTION Left    CYSTOSCOPY WITH RETROGRADE PYELOGRAM, URETEROSCOPY AND STENT PLACEMENT Left 10/06/2015   Procedure: CYSTOSCOPY WITH RETROGRADE PYELOGRAM, AND LEFT STENT PLACEMENT ;  Surgeon: Cleon Gustin, MD;  Location: [facility];  Service: Urology;  Laterality: Left;       Family History  Problem Relation Age of Onset   Cancer Mother    Alcoholism Father    Cancer Father    Cancer Other    Cancer Sister    Diabetes Brother    Stroke Brother     Social History   Tobacco Use   Smoking status: Every Day    Packs/day: 1.00    Years: 55.00    Pack years: 55.00    Types: Cigarettes   Smokeless tobacco: Never  Substance Use Topics   Alcohol use: No   Drug use: No    Home Medications Prior to Admission medications   Medication Sig Start Date End Date Taking? Authorizing Provider  amLODipine (NORVASC) 5 MG tablet Take 5 mg by mouth daily. 08/12/20   [provider]  aspirin EC 325 MG EC tablet Take 1 tablet (325 mg total) daily by mouth. 06/25/17   Kayleen Memos, DO  carboxymethylcellulose (REFRESH PLUS) 0.5 % SOLN Place  1 drop into the right eye in the morning and at bedtime.    [provider]  carvedilol (COREG) 3.125 MG tablet Take 3.125 mg by mouth 2 (two) times daily with a meal. 06/17/20 06/17/21  [provider]  finasteride (PROSCAR) 5 MG tablet Take 1 tablet (5 mg total) by mouth daily. 02/28/21   Franchot Gallo, MD  Insulin Glargine (LANTUS SOLOSTAR) 100 UNIT/ML Solostar Pen Inject 29 Units into the skin daily. Patient taking differently: Inject 30 Units into the skin at bedtime. 07/02/17   Timmothy Euler, MD  sildenafil (VIAGRA) 100 MG tablet 1/2 to 1 tablet p.o. as needed 02/28/21   Franchot Gallo, MD  tamsulosin (FLOMAX) 0.4 MG CAPS capsule Take 1 capsule (0.4 mg total) by mouth  daily after supper. 02/28/21   Franchot Gallo, MD  White Petrolatum-Mineral Oil (REFRESH LACRI-LUBE) OINT Place 1 drop into both eyes at bedtime.    [provider]    Allergies    Patient has no known allergies.  Review of Systems   Review of Systems  Unable to perform ROS: Other  Unreliable historian, baseline confusion Physical Exam Updated Vital Signs BP (!) 183/83 (BP Location: Right Arm)   Pulse 80   Temp 98.1 F (36.7 C) (Oral)   Resp 16   Ht 6\' 1"  (1.854 m)   Wt 79.4 kg   SpO2 100%   BMI 23.09 kg/m   Physical Exam Vitals and nursing note reviewed.  Constitutional:      General: He is not in acute distress.    Appearance: He is well-developed. He is not diaphoretic.  HENT:     Head: Normocephalic and atraumatic.  Eyes:     General: No scleral icterus.    Extraocular Movements: Extraocular movements intact.     Conjunctiva/sclera: Conjunctivae normal.     Comments: Right eye with complete opacity.  Unable to see out of the left eye.  Cardiovascular:     Rate and Rhythm: Normal rate and regular rhythm.     Heart sounds: Normal heart sounds.  Pulmonary:     Effort: Pulmonary effort is normal. No respiratory distress.     Breath sounds: Normal breath sounds.  Abdominal:     Palpations: Abdomen is soft.     Tenderness: There is no abdominal tenderness.  Musculoskeletal:     Cervical back: Normal range of motion and neck supple.  Skin:    General: Skin is warm and dry.  Neurological:     Mental Status: He is alert.     Motor: Weakness present.     Deep Tendon Reflexes: Reflexes normal.     Comments: Patient with abnormal facial movements consistent with tardive dyskinesia.  Speech sounds goal oriented and does not seem abnormal outside of his edentulousness and abnormal mouth movements. Patient has left upper and lower extremity weakness.   He has no pronator drift.  Unable to perform finger-to-nose due to blindness.  No obvious nystagmus, EOMI.   Cranial nerves are intact.  Gait and balance deferred. Sensation intact  Psychiatric:        Behavior: Behavior normal.    ED Results / Procedures / Treatments   Labs (all labs ordered are listed, but only abnormal results are displayed) Labs Reviewed - No data to display  EKG None  Radiology No results found.  Procedures Procedures   Medications Ordered in ED Medications - No data to display  ED Course  I have reviewed the triage vital signs and  the nursing notes.  Pertinent labs & imaging results that were available during my care of the patient were reviewed by me and considered in my medical decision making (see chart for details).  Clinical Course as of 05/13/21 1849  Sat May 13, 2021  1540 Case discussed with Dr. Rory Percy-  [AH]    Clinical Course User Index [AH] Margarita Mail, PA-C   MDM Rules/Calculators/A&P                           CC:ams VS:  Vitals:   05/13/21 1605 05/13/21 1700 05/13/21 1730 05/13/21 1739  BP: (!) 169/86 (!) 168/86 (!) 178/87   Pulse: 73 65 69   Resp: (!) 27 14 13    Temp:    97.9 F (36.6 C)  TempSrc:    Oral  SpO2: 100% 100% 100%   Weight:      Height:        CW:CBJSEGB is gathered by ems, emr, discussion with S/O. Previous records obtained and reviewed. DDX:The patient's complaint of ams involves an extensive number of diagnostic and treatment options, and is a complaint that carries with it a high risk of complications, morbidity, and potential mortality. Given the large differential diagnosis, medical decision making is of high complexity. The differential diagnosis for AMS is extensive and includes, but is not limited to: drug overdose - opioids, alcohol, sedatives, antipsychotics, drug withdrawal, others; Metabolic: hypoxia, hypoglycemia, hyperglycemia, hypercalcemia, hypernatremia, hyponatremia, uremia, hepatic encephalopathy, hypothyroidism, hyperthyroidism, vitamin B12 or thiamine deficiency, carbon monoxide poisoning,  Wilson's disease, Lactic acidosis, infectious: meningitis, encephalitis, bacteremia/sepsis, urinary tract infection, pneumonia, neurosyphilis; Structural: Space-occupying lesion, (brain tumor, subdural hematoma, hydrocephalus,); Vascular: stroke, subarachnoid hemorrhage, coronary ischemia, hypertensive encephalopathy, CNS vasculitis, thrombotic thrombocytopenic purpura, disseminated intravascular coagulation, hyperviscosity; Psychiatric: Schizophrenia, depression; Other: Seizure, hypothermia, heat stroke, ICU psychosis, dementia -"sundowning."  Labs: I ordered reviewed and interpreted labs which include Cbc-with mild normocytic anemia CMP with elevated blood glucose, BUN and creatinine consistent with patient's history of end-stage renal disease, normal potassium level, ethanol, APTT, PT/INR, respiratory panel and ammonia within normal limits.  Urinalysis with protein and elevated glucosuria consistent with history of renal disease and diabetes.  UDS positive for cocaine Imaging: I ordered and reviewed images which included CT head. I independently visualized and interpreted all imaging. There are no acute, significant findings on today's images. EKG: Consults: Dr. Rory Percy of neurology MDM: Patient here with altered mental status.  I discussed complaints with the patient's significant other who called EMS.  She states that he seemed weaker on the left side and right side and that he was not using his arm and would not sit up well.  She also states that he had some abnormal facial movements.  On my examination the patient is moving his right arm normally.  He has 4-5 strength in upper and lower extremities on the left side which is consistent with his history of hemiparesis.  He appears to have tardive dyskinesia which could certainly be caused by his recent use of cocaine.  Patient is speaking normally and is at his baseline in terms of confusion.  Patient is currently being worked up in the outpatient setting  with neurology and has a scheduled MRI of the brain coming up.  He does not appear to have any focal neurologic deficits that are new on my assessment.  After discussion with Dr. Rory Percy we both feel that the patient does not need to be transferred for emergent MRI  of the brain as he has upcoming findings and no emergent findings here in the emergency department.  I discussed all of the work-up with the patient significant other who agrees to come and pick him up for discharge at this time. Patient disposition:The patient appears reasonably screened and/or stabilized for discharge and I doubt any other medical condition or other [facility] requiring further screening, evaluation, or treatment in the ED at this time prior to discharge. I have discussed lab and/or imaging findings with the patient and answered all questions/concerns to the best of my ability.I have discussed return precautions and OP follow up.    Final Clinical Impression(s) / ED Diagnoses Final diagnoses:  None    Rx / DC Orders ED Discharge Orders     None        Margarita Mail, PA-C 05/13/21 1856    Truddie Hidden, MD 05/14/21 [phone]

## 2021-05-13 NOTE — ED Triage Notes (Addendum)
Pt brought in by [facility] from home with c/o slurred speech, right arm numbness and left arm and leg weakness that started at unknown time. Pt's wife has been out of town for the past week and when she returned today, this is how she found him. Hx of 2 previous strokes. CBG 162, BP 199/90 for EMS. Pt answering some questions appropriately and some not.

## 2021-05-13 NOTE — ED Notes (Signed)
Discharge paperwork discussed with pt and his wife. Wife waited in front of ED entrance in her car for pt to be brought out for discharge instead of coming in. Therefore discharge paperwork not signed. Pt refused to sign, stating, "my wife does all that for me".

## 2021-05-15 DIAGNOSIS — G8192 Hemiplegia, unspecified affecting left dominant side: Secondary | ICD-10-CM | POA: Diagnosis not present

## 2021-05-15 DIAGNOSIS — E11319 Type 2 diabetes mellitus with unspecified diabetic retinopathy without macular edema: Secondary | ICD-10-CM | POA: Diagnosis not present

## 2021-05-15 DIAGNOSIS — N184 Chronic kidney disease, stage 4 (severe): Secondary | ICD-10-CM | POA: Diagnosis not present

## 2021-05-15 DIAGNOSIS — I129 Hypertensive chronic kidney disease with stage 1 through stage 4 chronic kidney disease, or unspecified chronic kidney disease: Secondary | ICD-10-CM | POA: Diagnosis not present

## 2021-05-15 DIAGNOSIS — F17210 Nicotine dependence, cigarettes, uncomplicated: Secondary | ICD-10-CM | POA: Diagnosis not present

## 2021-05-15 DIAGNOSIS — R2681 Unsteadiness on feet: Secondary | ICD-10-CM | POA: Diagnosis not present

## 2021-05-18 ENCOUNTER — Telehealth: Payer: Self-pay | Admitting: Urology

## 2021-05-18 NOTE — Telephone Encounter (Signed)
Pt's spouse, April, walked in advising she had the pt's medications in a bag that was dropped & all of his pills spilled out on the ground.  At this time, she is requesting a refill on the following to be sent to [facility], please:   Finasteride   Tamsulosin   Sildenafil  April is aware the pt missed his 05/02/21 appt & is rescheduling now.  She can be reached @ [phone] for any further questions or cocnerns.  Thank you

## 2021-05-19 NOTE — Telephone Encounter (Signed)
Spoke with wife and patient has already gotten medication.

## 2021-05-22 ENCOUNTER — Other Ambulatory Visit: Payer: Medicare Other

## 2021-05-31 ENCOUNTER — Other Ambulatory Visit: Payer: Self-pay | Admitting: Psychiatry

## 2021-05-31 DIAGNOSIS — R413 Other amnesia: Secondary | ICD-10-CM

## 2021-06-01 ENCOUNTER — Telehealth: Payer: Self-pay | Admitting: Psychiatry

## 2021-06-01 NOTE — Telephone Encounter (Signed)
Referral for neuropsych testing sent to [facility]. Phone: [phone].

## 2021-06-03 ENCOUNTER — Other Ambulatory Visit: Payer: Medicare Other

## 2021-06-06 DIAGNOSIS — R809 Proteinuria, unspecified: Secondary | ICD-10-CM | POA: Diagnosis not present

## 2021-06-06 DIAGNOSIS — N189 Chronic kidney disease, unspecified: Secondary | ICD-10-CM | POA: Diagnosis not present

## 2021-06-06 DIAGNOSIS — I129 Hypertensive chronic kidney disease with stage 1 through stage 4 chronic kidney disease, or unspecified chronic kidney disease: Secondary | ICD-10-CM | POA: Diagnosis not present

## 2021-06-06 DIAGNOSIS — E1129 Type 2 diabetes mellitus with other diabetic kidney complication: Secondary | ICD-10-CM | POA: Diagnosis not present

## 2021-06-06 DIAGNOSIS — E1122 Type 2 diabetes mellitus with diabetic chronic kidney disease: Secondary | ICD-10-CM | POA: Diagnosis not present

## 2021-06-07 DIAGNOSIS — N189 Chronic kidney disease, unspecified: Secondary | ICD-10-CM | POA: Diagnosis not present

## 2021-06-07 DIAGNOSIS — D631 Anemia in chronic kidney disease: Secondary | ICD-10-CM | POA: Diagnosis not present

## 2021-06-07 DIAGNOSIS — E1122 Type 2 diabetes mellitus with diabetic chronic kidney disease: Secondary | ICD-10-CM | POA: Diagnosis not present

## 2021-06-07 DIAGNOSIS — N185 Chronic kidney disease, stage 5: Secondary | ICD-10-CM | POA: Diagnosis not present

## 2021-06-07 DIAGNOSIS — I129 Hypertensive chronic kidney disease with stage 1 through stage 4 chronic kidney disease, or unspecified chronic kidney disease: Secondary | ICD-10-CM | POA: Diagnosis not present

## 2021-06-07 DIAGNOSIS — R809 Proteinuria, unspecified: Secondary | ICD-10-CM | POA: Diagnosis not present

## 2021-06-07 DIAGNOSIS — N170 Acute kidney failure with tubular necrosis: Secondary | ICD-10-CM | POA: Diagnosis not present

## 2021-06-07 DIAGNOSIS — E1129 Type 2 diabetes mellitus with other diabetic kidney complication: Secondary | ICD-10-CM | POA: Diagnosis not present

## 2021-06-15 DIAGNOSIS — N184 Chronic kidney disease, stage 4 (severe): Secondary | ICD-10-CM | POA: Diagnosis not present

## 2021-06-15 DIAGNOSIS — E118 Type 2 diabetes mellitus with unspecified complications: Secondary | ICD-10-CM | POA: Diagnosis not present

## 2021-07-13 ENCOUNTER — Inpatient Hospital Stay (HOSPITAL_COMMUNITY): Payer: Medicare Other | Attending: Hematology

## 2021-07-15 DIAGNOSIS — N184 Chronic kidney disease, stage 4 (severe): Secondary | ICD-10-CM | POA: Diagnosis not present

## 2021-07-15 DIAGNOSIS — E118 Type 2 diabetes mellitus with unspecified complications: Secondary | ICD-10-CM | POA: Diagnosis not present

## 2021-07-20 ENCOUNTER — Other Ambulatory Visit: Payer: Self-pay

## 2021-07-20 ENCOUNTER — Ambulatory Visit (HOSPITAL_COMMUNITY): Payer: Medicare Other | Admitting: Physician Assistant

## 2021-07-20 ENCOUNTER — Emergency Department (HOSPITAL_COMMUNITY): Payer: Medicare Other

## 2021-07-20 ENCOUNTER — Encounter (HOSPITAL_COMMUNITY): Payer: Self-pay | Admitting: *Deleted

## 2021-07-20 ENCOUNTER — Emergency Department (HOSPITAL_COMMUNITY)
Admission: EM | Admit: 2021-07-20 | Discharge: 2021-07-20 | Disposition: A | Discharge status: Home / Self Care | Payer: Medicare Other | Attending: Emergency Medicine | Admitting: Emergency Medicine

## 2021-07-20 DIAGNOSIS — J320 Chronic maxillary sinusitis: Secondary | ICD-10-CM | POA: Diagnosis not present

## 2021-07-20 DIAGNOSIS — R410 Disorientation, unspecified: Secondary | ICD-10-CM | POA: Insufficient documentation

## 2021-07-20 DIAGNOSIS — Z7902 Long term (current) use of antithrombotics/antiplatelets: Secondary | ICD-10-CM | POA: Insufficient documentation

## 2021-07-20 DIAGNOSIS — Z79899 Other long term (current) drug therapy: Secondary | ICD-10-CM | POA: Insufficient documentation

## 2021-07-20 DIAGNOSIS — J321 Chronic frontal sinusitis: Secondary | ICD-10-CM | POA: Diagnosis not present

## 2021-07-20 DIAGNOSIS — N186 End stage renal disease: Secondary | ICD-10-CM

## 2021-07-20 DIAGNOSIS — E1140 Type 2 diabetes mellitus with diabetic neuropathy, unspecified: Secondary | ICD-10-CM | POA: Insufficient documentation

## 2021-07-20 DIAGNOSIS — N19 Unspecified kidney failure: Secondary | ICD-10-CM | POA: Diagnosis not present

## 2021-07-20 DIAGNOSIS — N185 Chronic kidney disease, stage 5: Secondary | ICD-10-CM | POA: Diagnosis not present

## 2021-07-20 DIAGNOSIS — E1122 Type 2 diabetes mellitus with diabetic chronic kidney disease: Secondary | ICD-10-CM | POA: Insufficient documentation

## 2021-07-20 DIAGNOSIS — F17210 Nicotine dependence, cigarettes, uncomplicated: Secondary | ICD-10-CM | POA: Diagnosis not present

## 2021-07-20 DIAGNOSIS — I120 Hypertensive chronic kidney disease with stage 5 chronic kidney disease or end stage renal disease: Secondary | ICD-10-CM | POA: Diagnosis not present

## 2021-07-20 DIAGNOSIS — Z794 Long term (current) use of insulin: Secondary | ICD-10-CM | POA: Diagnosis not present

## 2021-07-20 DIAGNOSIS — R5383 Other fatigue: Secondary | ICD-10-CM | POA: Diagnosis not present

## 2021-07-20 LAB — CBC WITH DIFFERENTIAL/PLATELET
Abs Immature Granulocytes: 0.02 10*3/uL (ref 0.00–0.07)
Basophils Absolute: 0 10*3/uL (ref 0.0–0.1)
Basophils Relative: 1 %
Eosinophils Absolute: 0.1 10*3/uL (ref 0.0–0.5)
Eosinophils Relative: 2 %
HCT: 31.3 % — ABNORMAL LOW (ref 39.0–52.0)
Hemoglobin: 9.9 g/dL — ABNORMAL LOW (ref 13.0–17.0)
Immature Granulocytes: 0 %
Lymphocytes Relative: 14 %
Lymphs Abs: 0.8 10*3/uL (ref 0.7–4.0)
MCH: 28.9 pg (ref 26.0–34.0)
MCHC: 31.6 g/dL (ref 30.0–36.0)
MCV: 91.3 fL (ref 80.0–100.0)
Monocytes Absolute: 0.5 10*3/uL (ref 0.1–1.0)
Monocytes Relative: 8 %
Neutro Abs: 4.4 10*3/uL (ref 1.7–7.7)
Neutrophils Relative %: 75 %
Platelets: 209 10*3/uL (ref 150–400)
RBC: 3.43 MIL/uL — ABNORMAL LOW (ref 4.22–5.81)
RDW: 13.4 % (ref 11.5–15.5)
WBC: 5.9 10*3/uL (ref 4.0–10.5)
nRBC: 0 % (ref 0.0–0.2)

## 2021-07-20 LAB — COMPREHENSIVE METABOLIC PANEL
ALT: 15 U/L (ref 0–44)
AST: 12 U/L — ABNORMAL LOW (ref 15–41)
Albumin: 3.6 g/dL (ref 3.5–5.0)
Alkaline Phosphatase: 86 U/L (ref 38–126)
Anion gap: 11 (ref 5–15)
BUN: 45 mg/dL — ABNORMAL HIGH (ref 8–23)
CO2: 20 mmol/L — ABNORMAL LOW (ref 22–32)
Calcium: 9.4 mg/dL (ref 8.9–10.3)
Chloride: 108 mmol/L (ref 98–111)
Creatinine, Ser: 5.15 mg/dL — ABNORMAL HIGH (ref 0.61–1.24)
GFR, Estimated: 11 mL/min — ABNORMAL LOW (ref >60)
Glucose, Bld: 192 mg/dL — ABNORMAL HIGH (ref 70–99)
Potassium: 5.2 mmol/L — ABNORMAL HIGH (ref 3.5–5.1)
Sodium: 139 mmol/L (ref 135–145)
Total Bilirubin: 0.5 mg/dL (ref 0.3–1.2)
Total Protein: 6.6 g/dL (ref 6.5–8.1)

## 2021-07-20 LAB — URINALYSIS, MICROSCOPIC (REFLEX)

## 2021-07-20 LAB — URINALYSIS, ROUTINE W REFLEX MICROSCOPIC
Bilirubin Urine: NEGATIVE
Glucose, UA: NEGATIVE mg/dL
Ketones, ur: NEGATIVE mg/dL
Leukocytes,Ua: NEGATIVE
Nitrite: NEGATIVE
Protein, ur: 100 mg/dL — AB
Specific Gravity, Urine: 1.025 (ref 1.005–1.030)
pH: 5.5 (ref 5.0–8.0)

## 2021-07-20 LAB — CBG MONITORING, ED: Glucose-Capillary: 109 mg/dL — ABNORMAL HIGH (ref 70–99)

## 2021-07-20 MED ORDER — SODIUM ZIRCONIUM CYCLOSILICATE 5 G PO PACK
5.0000 g | PACK | Freq: Once | ORAL | Status: AC
Start: 2021-07-20 — End: 2021-07-20
  Administered 2021-07-20: 5 g via ORAL
  Filled 2021-07-20: qty 1

## 2021-07-20 MED ORDER — LOKELMA 5 G PO PACK: PACK | ORAL | 0 refills | Status: AC

## 2021-07-20 MED ORDER — SODIUM CHLORIDE 0.9 % IV BOLUS
500.0000 mL | Freq: Once | INTRAVENOUS | Status: AC
  Administered 2021-07-20: 500 mL via INTRAVENOUS

## 2021-07-20 NOTE — ED Notes (Signed)
Patient transported to CT 

## 2021-07-20 NOTE — ED Notes (Signed)
Upon assessment BP cuff in place on left arm. This nurse moved BP cuff to right arm.

## 2021-07-20 NOTE — ED Notes (Signed)
Pt attempting to get OOB to use restroom. This nurse educated pt on using the [place] and the importance of staying hooked up to the cardiac monitor.

## 2021-07-20 NOTE — ED Notes (Signed)
Gave pt oral sponge dramp ok per RN AM

## 2021-07-20 NOTE — ED Provider Notes (Signed)
[facility] EMERGENCY DEPARTMENT Provider Note   CSN: [id number] Arrival date & time: 07/20/21  1048     History Chief Complaint  Patient presents with   Fatigue    Trevor Crawford is a 70 y.o. male.  Patient comes in complaining of weakness for months.  And worsening confusion  The history is provided by the patient and medical records. No language interpreter was used.  Weakness Severity:  Moderate Onset quality:  Sudden Timing:  Intermittent Progression:  Waxing and waning Chronicity:  Recurrent Context: not alcohol use   Relieved by:  Nothing Worsened by:  Nothing Associated symptoms: no abdominal pain, no chest pain, no cough, no diarrhea, no frequency, no headaches and no seizures       Past Medical History:  Diagnosis Date   Arthritis    Cataract    bilateral   Chronic kidney disease    Depression    Diabetes mellitus without complication (HCC)    GERD (gastroesophageal reflux disease)    Glaucoma    Hyperlipidemia    Hypertension    Legally blind in right eye, as defined in [place]    Neuromuscular disorder ([place])    Neuropathy    Stroke [facility])     Patient Active Problem List   Diagnosis Date Noted   MGUS (monoclonal gammopathy of unknown significance) 07/12/2020   Smoker    History of stroke    Cerebral thrombosis with cerebral infarction 06/23/2017   Left-sided weakness 06/22/2017   Late effects of CVA (cerebrovascular accident) 04/11/2017   CVA (cerebral vascular accident) ([place]) 04/04/2017   CKD (chronic kidney disease), stage III ([place]) 04/04/2017   Acute CVA (cerebrovascular accident) ([place]) 11/21/2016   Glaucoma 11/21/2016   Hyperlipidemia 11/21/2016   GERD (gastroesophageal reflux disease) 11/21/2016   Depression 05/07/2016   HTN (hypertension) 05/07/2016   Blindness 05/07/2016   BPH (benign prostatic hyperplasia) 05/07/2016   T2DM (type 2 diabetes mellitus) ([place]) 05/07/2016   Renal calculi 10/06/2015   Ureteral calculus, left 10/05/2015     Past Surgical History:  Procedure Laterality Date   AV FISTULA PLACEMENT Left 02/21/2021   Procedure: LEFT RADIOCEPHALIC ARTERIOVENOUS (AV) FISTULA CREATION;  Surgeon: Rosetta Posner, MD;  Location: [facility];  Service: Vascular;  Laterality: Left;   CATARACT EXTRACTION Left    CYSTOSCOPY WITH RETROGRADE PYELOGRAM, URETEROSCOPY AND STENT PLACEMENT Left 10/06/2015   Procedure: CYSTOSCOPY WITH RETROGRADE PYELOGRAM, AND LEFT STENT PLACEMENT ;  Surgeon: Cleon Gustin, MD;  Location: [facility];  Service: Urology;  Laterality: Left;       Family History  Problem Relation Age of Onset   Cancer Mother    Alcoholism Father    Cancer Father    Cancer Other    Cancer Sister    Diabetes Brother    Stroke Brother     Social History   Tobacco Use   Smoking status: Every Day    Packs/day: 1.00    Years: 55.00    Pack years: 55.00    Types: Cigarettes   Smokeless tobacco: Never  Substance Use Topics   Alcohol use: No   Drug use: No    Home Medications Prior to Admission medications   Medication Sig Start Date End Date Taking? Authorizing Provider  amLODipine (NORVASC) 5 MG tablet Take 5 mg by mouth daily. 08/12/20  Yes [provider]  carboxymethylcellulose (REFRESH PLUS) 0.5 % SOLN Place 1 drop into the right eye in the morning and at bedtime.   Yes [provider]  clopidogrel (PLAVIX) 75 MG tablet Take 75 mg by mouth daily. 03/24/21  Yes [provider]  finasteride (PROSCAR) 5 MG tablet Take 1 tablet (5 mg total) by mouth daily. 02/28/21  Yes Dahlstedt, Annie Main, MD  Insulin Glargine (LANTUS SOLOSTAR) 100 UNIT/ML Solostar Pen Inject 29 Units into the skin daily. Patient taking differently: Inject 30 Units into the skin at bedtime. 07/02/17  Yes Timmothy Euler, MD  lisinopril (ZESTRIL) 40 MG tablet Take 40 mg by mouth daily. 05/17/21  Yes [provider]  sodium zirconium cyclosilicate (LOKELMA) 5 g packet Take 1 packet twice a week. 07/20/21  Yes  Milton Ferguson, MD  tamsulosin (FLOMAX) 0.4 MG CAPS capsule Take 1 capsule (0.4 mg total) by mouth daily after supper. 02/28/21  Yes Dahlstedt, Annie Main, MD  White Petrolatum-Mineral Oil (REFRESH LACRI-LUBE) OINT Place 1 drop into both eyes at bedtime.   Yes [provider]  aspirin EC 325 MG EC tablet Take 1 tablet (325 mg total) daily by mouth. Patient not taking: Reported on 07/20/2021 06/25/17   Kayleen Memos, DO  carvedilol (COREG) 3.125 MG tablet Take 3.125 mg by mouth 2 (two) times daily with a meal. Patient not taking: Reported on 07/20/2021 06/17/20 06/17/21  [provider]  sildenafil (VIAGRA) 100 MG tablet 1/2 to 1 tablet p.o. as needed 02/28/21   Franchot Gallo, MD    Allergies    Patient has no known allergies.  Review of Systems   Review of Systems  Constitutional:  Negative for appetite change and fatigue.  HENT:  Negative for congestion, ear discharge and sinus pressure.   Eyes:  Negative for discharge.  Respiratory:  Negative for cough.   Cardiovascular:  Negative for chest pain.  Gastrointestinal:  Negative for abdominal pain and diarrhea.  Genitourinary:  Negative for frequency and hematuria.  Musculoskeletal:  Negative for back pain.  Skin:  Negative for rash.  Neurological:  Positive for weakness. Negative for seizures and headaches.  Psychiatric/Behavioral:  Negative for hallucinations.    Physical Exam Updated Vital Signs BP (!) 141/74    Pulse 77    Temp 98.4 F (36.9 C)    Resp 15    SpO2 100%   Physical Exam Vitals and nursing note reviewed.  Constitutional:      Appearance: He is well-developed.  HENT:     Head: Normocephalic.     Nose: Nose normal.  Eyes:     General: No scleral icterus.    Conjunctiva/sclera: Conjunctivae normal.  Neck:     Thyroid: No thyromegaly.  Cardiovascular:     Rate and Rhythm: Normal rate and regular rhythm.     Heart sounds: No murmur heard.   No friction rub. No gallop.  Pulmonary:      Breath sounds: No stridor. No wheezing or rales.  Chest:     Chest wall: No tenderness.  Abdominal:     General: There is no distension.     Tenderness: There is no abdominal tenderness. There is no rebound.  Musculoskeletal:        General: Normal range of motion.     Cervical back: Neck supple.  Lymphadenopathy:     Cervical: No cervical adenopathy.  Skin:    Findings: No erythema or rash.  Neurological:     Mental Status: He is alert and oriented to person, place, and time.     Motor: No abnormal muscle tone.     Coordination: Coordination normal.  Psychiatric:  Behavior: Behavior normal.    ED Results / Procedures / Treatments   Labs (all labs ordered are listed, but only abnormal results are displayed) Labs Reviewed  CBC WITH DIFFERENTIAL/PLATELET - Abnormal; Notable for the following components:      Result Value   RBC 3.43 (*)    Hemoglobin 9.9 (*)    HCT 31.3 (*)    All other components within normal limits  COMPREHENSIVE METABOLIC PANEL - Abnormal; Notable for the following components:   Potassium 5.2 (*)    CO2 20 (*)    Glucose, Bld 192 (*)    BUN 45 (*)    Creatinine, Ser 5.15 (*)    AST 12 (*)    GFR, Estimated 11 (*)    All other components within normal limits  URINALYSIS, ROUTINE W REFLEX MICROSCOPIC - Abnormal; Notable for the following components:   Hgb urine dipstick TRACE (*)    Protein, ur 100 (*)    All other components within normal limits  URINALYSIS, MICROSCOPIC (REFLEX) - Abnormal; Notable for the following components:   Bacteria, UA FEW (*)    All other components within normal limits  CBG MONITORING, ED - Abnormal; Notable for the following components:   Glucose-Capillary 109 (*)    All other components within normal limits    EKG None  Radiology CT Head Wo Contrast  Result Date: 07/20/2021 CLINICAL DATA:  Fatigue and delirium. EXAM: CT HEAD WITHOUT CONTRAST TECHNIQUE: Contiguous axial images were obtained from the base of  the skull through the vertex without intravenous contrast. COMPARISON:  05/13/2021 FINDINGS: Brain: There is no evidence for acute hemorrhage, hydrocephalus, mass lesion, or abnormal extra-axial fluid collection. No definite CT evidence for acute infarction. Diffuse loss of parenchymal volume is consistent with atrophy. Patchy low attenuation in the deep hemispheric and periventricular white matter is nonspecific, but likely reflects chronic microvascular ischemic demyelination. Vascular: No hyperdense vessel or unexpected calcification. Skull: No evidence for fracture. No worrisome lytic or sclerotic lesion. Sinuses/Orbits: Mild chronic mucosal disease noted left frontal and right maxillary sinuses. Visualized portions of the globes and intraorbital fat are unremarkable. Other: None. IMPRESSION: 1. No acute intracranial abnormality. 2. Atrophy with chronic small vessel white matter ischemic disease. Electronically Signed   By: Misty Stanley M.D.   On: 07/20/2021 12:28   DG Chest Port 1 View  Result Date: 07/20/2021 CLINICAL DATA:  Renal failure. EXAM: PORTABLE CHEST 1 VIEW COMPARISON:  06/22/2017 FINDINGS: 1138 hours. The lungs are clear without focal pneumonia, edema, pneumothorax or pleural effusion. The cardiopericardial silhouette is within normal limits for size. The visualized bony structures of the thorax show no acute abnormality. Telemetry leads overlie the chest. IMPRESSION: No active disease. Electronically Signed   By: Misty Stanley M.D.   On: 07/20/2021 11:53    Procedures Procedures   Medications Ordered in ED Medications  sodium chloride 0.9 % bolus 500 mL (0 mLs Intravenous Stopped 07/20/21 1513)  sodium zirconium cyclosilicate (LOKELMA) packet 5 g (5 g Oral Given 07/20/21 1310)    ED Course  I have reviewed the triage vital signs and the nursing notes.  Pertinent labs & imaging results that were available during my care of the patient were reviewed by me and considered in my  medical decision making (see chart for details).    MDM Rules/Calculators/A&P                         Patient with renal failure and  dementia and mild hyperkalemia.  I spoke with his nephrologist who recommended lokelma twice a day.  Patient will follow up with his nephrologist and neurology    Final Clinical Impression(s) / ED Diagnoses Final diagnoses:  Kidney disease, chronic, stage V (end stage, EGFR < 15 ml/min) ([place])    Rx / DC Orders ED Discharge Orders          Ordered    sodium zirconium cyclosilicate (LOKELMA) 5 g packet        07/20/21 1525             Milton Ferguson, MD 07/25/21 1228

## 2021-07-20 NOTE — Discharge Instructions (Signed)
Follow-up with your kidney doctor in the next couple weeks.  He also have been referred to a neurologist to help with the dementia

## 2021-07-20 NOTE — ED Triage Notes (Signed)
Patient is in renal failure, family member advises kidney doctor sent him here for evaluation and lab.

## 2021-07-24 NOTE — Progress Notes (Incomplete)
History of Present Illness:   7.26.2022: 70 yo male on chronic HD presents referred by Dr Theador Hawthorne for E/M of several abnormalities of GU tract seen on recent renal/pelvic ultrasounds.  Additionally, he has significant lower urinary tract symptoms.  IPSS 29, quality-of-life score 4.  He was on tamsulosin but this was stopped a while back.  He thinks he may have had some improvement of his urinary symptoms while on that.  He does have erectile dysfunction.  Last adequate intercourse for the patient and his wife was about a year ago.  He has not tried sildenafil.  He does have end-stage renal disease.  He will be starting dialysis in the near future.  He has a left upper extremity fistula.   (4.8.2022): Renal U/S-- 1. Increased echogenicity within the kidneys, compatible with medical renal disease. No hydronephrosis. 2. 1.2 cm mildly complex septated left renal cyst, stable from previous. 3. Mild diffuse wall thickening about the partially distended bladder, nonspecific. Correlation with urinalysis recommended.     4.26.2022: Bladder U/S--Similar mild circumferential bladder wall thickening. Pre-void bladder volume of 296 cm^3. Post-void bladder volume of 282 ^3.  12.20.2022:   Past Medical History:  Diagnosis Date   Arthritis    Cataract    bilateral   Chronic kidney disease    Depression    Diabetes mellitus without complication (HCC)    GERD (gastroesophageal reflux disease)    Glaucoma    Hyperlipidemia    Hypertension    Legally blind in right eye, as defined in [place]    Neuromuscular disorder ([place])    Neuropathy    Stroke [facility])     Past Surgical History:  Procedure Laterality Date   AV FISTULA PLACEMENT Left 02/21/2021   Procedure: LEFT RADIOCEPHALIC ARTERIOVENOUS (AV) FISTULA CREATION;  Surgeon: Rosetta Posner, MD;  Location: [facility];  Service: Vascular;  Laterality: Left;   CATARACT EXTRACTION Left    CYSTOSCOPY WITH RETROGRADE PYELOGRAM, URETEROSCOPY AND STENT  PLACEMENT Left 10/06/2015   Procedure: CYSTOSCOPY WITH RETROGRADE PYELOGRAM, AND LEFT STENT PLACEMENT ;  Surgeon: Cleon Gustin, MD;  Location: [facility];  Service: Urology;  Laterality: Left;    Home Medications:  Allergies as of 07/25/2021   No Known Allergies      Medication List        Accurate as of July 24, 2021 10:01 AM. If you have any questions, ask your nurse or doctor.          amLODipine 5 MG tablet Commonly known as: NORVASC Take 5 mg by mouth daily.   aspirin 325 MG EC tablet Take 1 tablet (325 mg total) daily by mouth.   carboxymethylcellulose 0.5 % Soln Commonly known as: REFRESH PLUS Place 1 drop into the right eye in the morning and at bedtime.   carvedilol 3.125 MG tablet Commonly known as: COREG Take 3.125 mg by mouth 2 (two) times daily with a meal.   clopidogrel 75 MG tablet Commonly known as: PLAVIX Take 75 mg by mouth daily.   finasteride 5 MG tablet Commonly known as: PROSCAR Take 1 tablet (5 mg total) by mouth daily.   insulin glargine 100 UNIT/ML Solostar Pen Commonly known as: Lantus SoloStar Inject 29 Units into the skin daily. What changed:  how much to take when to take this   lisinopril 40 MG tablet Commonly known as: ZESTRIL Take 40 mg by mouth daily.   Lokelma 5 g packet Generic drug: sodium zirconium cyclosilicate Take 1 packet twice a week.  Refresh Lacri-Lube Oint Place 1 drop into both eyes at bedtime.   sildenafil 100 MG tablet Commonly known as: VIAGRA 1/2 to 1 tablet p.o. as needed   tamsulosin 0.4 MG Caps capsule Commonly known as: FLOMAX Take 1 capsule (0.4 mg total) by mouth daily after supper.        Allergies: No Known Allergies  Family History  Problem Relation Age of Onset   Cancer Mother    Alcoholism Father    Cancer Father    Cancer Other    Cancer Sister    Diabetes Brother    Stroke Brother     Social History:  reports that he has been smoking cigarettes. He has a 55.00  pack-year smoking history. He has never used smokeless tobacco. He reports that he does not drink alcohol and does not use drugs.  ROS: A complete review of systems was performed.  All systems are negative except for pertinent findings as noted.  Physical Exam:  Vital signs in last 24 hours: There were no vitals taken for this visit. Constitutional:  Alert and oriented, No acute distress Cardiovascular: Regular rate  Respiratory: Normal respiratory effort GI: Abdomen is soft, nontender, nondistended, no abdominal masses. No CVAT.  Genitourinary: Normal male phallus, testes are descended bilaterally and non-tender and without masses, scrotum is normal in appearance without lesions or masses, perineum is normal on inspection. Lymphatic: No lymphadenopathy Neurologic: Grossly intact, no focal deficits Psychiatric: Normal mood and affect  I have reviewed prior pt notes  I have reviewed notes from referring/previous physicians  I have reviewed urinalysis results  I have independently reviewed prior imaging  I have reviewed prior PSA results  I have reviewed prior urine culture   Impression/Assessment:  ***  Plan:  ***

## 2021-07-25 ENCOUNTER — Ambulatory Visit: Payer: Medicare Other | Admitting: Urology

## 2021-08-14 ENCOUNTER — Ambulatory Visit: Payer: No Typology Code available for payment source | Admitting: Psychiatry

## 2021-08-23 DIAGNOSIS — E1122 Type 2 diabetes mellitus with diabetic chronic kidney disease: Secondary | ICD-10-CM | POA: Diagnosis not present

## 2021-08-23 DIAGNOSIS — N185 Chronic kidney disease, stage 5: Secondary | ICD-10-CM | POA: Diagnosis not present

## 2021-08-23 DIAGNOSIS — N186 End stage renal disease: Secondary | ICD-10-CM | POA: Diagnosis not present

## 2021-08-23 DIAGNOSIS — F03C0 Unspecified dementia, severe, without behavioral disturbance, psychotic disturbance, mood disturbance, and anxiety: Secondary | ICD-10-CM | POA: Diagnosis not present

## 2021-08-23 DIAGNOSIS — E875 Hyperkalemia: Secondary | ICD-10-CM | POA: Diagnosis not present

## 2021-08-24 ENCOUNTER — Encounter (HOSPITAL_COMMUNITY): Payer: Self-pay

## 2021-08-24 DIAGNOSIS — E119 Type 2 diabetes mellitus without complications: Secondary | ICD-10-CM | POA: Diagnosis not present

## 2021-08-24 DIAGNOSIS — N186 End stage renal disease: Secondary | ICD-10-CM | POA: Diagnosis not present

## 2021-08-24 DIAGNOSIS — J438 Other emphysema: Secondary | ICD-10-CM | POA: Diagnosis not present

## 2021-08-24 DIAGNOSIS — I10 Essential (primary) hypertension: Secondary | ICD-10-CM | POA: Diagnosis not present

## 2021-08-24 DIAGNOSIS — I69398 Other sequelae of cerebral infarction: Secondary | ICD-10-CM | POA: Diagnosis not present

## 2021-08-24 DIAGNOSIS — F03C0 Unspecified dementia, severe, without behavioral disturbance, psychotic disturbance, mood disturbance, and anxiety: Secondary | ICD-10-CM | POA: Diagnosis not present

## 2021-08-24 DIAGNOSIS — D509 Iron deficiency anemia, unspecified: Secondary | ICD-10-CM | POA: Diagnosis not present

## 2021-08-24 DIAGNOSIS — E7849 Other hyperlipidemia: Secondary | ICD-10-CM | POA: Diagnosis not present

## 2021-08-24 DIAGNOSIS — L89152 Pressure ulcer of sacral region, stage 2: Secondary | ICD-10-CM | POA: Diagnosis not present

## 2021-08-24 DIAGNOSIS — M6281 Muscle weakness (generalized): Secondary | ICD-10-CM | POA: Diagnosis not present

## 2021-08-24 DIAGNOSIS — N184 Chronic kidney disease, stage 4 (severe): Secondary | ICD-10-CM | POA: Diagnosis not present

## 2021-08-24 DIAGNOSIS — L89150 Pressure ulcer of sacral region, unstageable: Secondary | ICD-10-CM | POA: Diagnosis not present

## 2021-08-24 DIAGNOSIS — E875 Hyperkalemia: Secondary | ICD-10-CM | POA: Diagnosis not present

## 2021-08-24 DIAGNOSIS — N185 Chronic kidney disease, stage 5: Secondary | ICD-10-CM | POA: Diagnosis not present

## 2021-08-24 DIAGNOSIS — E1122 Type 2 diabetes mellitus with diabetic chronic kidney disease: Secondary | ICD-10-CM | POA: Diagnosis not present

## 2021-08-25 DIAGNOSIS — E1122 Type 2 diabetes mellitus with diabetic chronic kidney disease: Secondary | ICD-10-CM | POA: Diagnosis not present

## 2021-08-25 DIAGNOSIS — F03C0 Unspecified dementia, severe, without behavioral disturbance, psychotic disturbance, mood disturbance, and anxiety: Secondary | ICD-10-CM | POA: Diagnosis not present

## 2021-08-25 DIAGNOSIS — E875 Hyperkalemia: Secondary | ICD-10-CM | POA: Diagnosis not present

## 2021-08-25 DIAGNOSIS — N186 End stage renal disease: Secondary | ICD-10-CM | POA: Diagnosis not present

## 2021-08-25 DIAGNOSIS — D638 Anemia in other chronic diseases classified elsewhere: Secondary | ICD-10-CM | POA: Diagnosis not present

## 2021-08-28 DIAGNOSIS — E1122 Type 2 diabetes mellitus with diabetic chronic kidney disease: Secondary | ICD-10-CM | POA: Diagnosis not present

## 2021-08-28 DIAGNOSIS — I69398 Other sequelae of cerebral infarction: Secondary | ICD-10-CM | POA: Diagnosis not present

## 2021-08-28 DIAGNOSIS — L89150 Pressure ulcer of sacral region, unstageable: Secondary | ICD-10-CM | POA: Diagnosis not present

## 2021-08-28 DIAGNOSIS — N186 End stage renal disease: Secondary | ICD-10-CM | POA: Diagnosis not present

## 2021-08-28 DIAGNOSIS — E7849 Other hyperlipidemia: Secondary | ICD-10-CM | POA: Diagnosis not present

## 2021-08-28 DIAGNOSIS — I10 Essential (primary) hypertension: Secondary | ICD-10-CM | POA: Diagnosis not present

## 2021-08-28 DIAGNOSIS — Z79899 Other long term (current) drug therapy: Secondary | ICD-10-CM | POA: Diagnosis not present

## 2021-08-28 DIAGNOSIS — E875 Hyperkalemia: Secondary | ICD-10-CM | POA: Diagnosis not present

## 2021-08-28 DIAGNOSIS — E119 Type 2 diabetes mellitus without complications: Secondary | ICD-10-CM | POA: Diagnosis not present

## 2021-08-28 DIAGNOSIS — M6281 Muscle weakness (generalized): Secondary | ICD-10-CM | POA: Diagnosis not present

## 2021-08-28 DIAGNOSIS — D509 Iron deficiency anemia, unspecified: Secondary | ICD-10-CM | POA: Diagnosis not present

## 2021-08-28 DIAGNOSIS — J438 Other emphysema: Secondary | ICD-10-CM | POA: Diagnosis not present

## 2021-08-28 DIAGNOSIS — N185 Chronic kidney disease, stage 5: Secondary | ICD-10-CM | POA: Diagnosis not present

## 2021-08-28 DIAGNOSIS — N184 Chronic kidney disease, stage 4 (severe): Secondary | ICD-10-CM | POA: Diagnosis not present

## 2021-08-29 DIAGNOSIS — M6281 Muscle weakness (generalized): Secondary | ICD-10-CM | POA: Diagnosis not present

## 2021-08-29 DIAGNOSIS — F03C0 Unspecified dementia, severe, without behavioral disturbance, psychotic disturbance, mood disturbance, and anxiety: Secondary | ICD-10-CM | POA: Diagnosis not present

## 2021-08-29 DIAGNOSIS — N186 End stage renal disease: Secondary | ICD-10-CM | POA: Diagnosis not present

## 2021-08-29 DIAGNOSIS — J438 Other emphysema: Secondary | ICD-10-CM | POA: Diagnosis not present

## 2021-08-29 DIAGNOSIS — L89150 Pressure ulcer of sacral region, unstageable: Secondary | ICD-10-CM | POA: Diagnosis not present

## 2021-08-29 DIAGNOSIS — D638 Anemia in other chronic diseases classified elsewhere: Secondary | ICD-10-CM | POA: Diagnosis not present

## 2021-08-29 DIAGNOSIS — I69398 Other sequelae of cerebral infarction: Secondary | ICD-10-CM | POA: Diagnosis not present

## 2021-08-29 DIAGNOSIS — I10 Essential (primary) hypertension: Secondary | ICD-10-CM | POA: Diagnosis not present

## 2021-08-29 DIAGNOSIS — N184 Chronic kidney disease, stage 4 (severe): Secondary | ICD-10-CM | POA: Diagnosis not present

## 2021-08-29 DIAGNOSIS — E7849 Other hyperlipidemia: Secondary | ICD-10-CM | POA: Diagnosis not present

## 2021-08-29 DIAGNOSIS — E875 Hyperkalemia: Secondary | ICD-10-CM | POA: Diagnosis not present

## 2021-08-29 DIAGNOSIS — E119 Type 2 diabetes mellitus without complications: Secondary | ICD-10-CM | POA: Diagnosis not present

## 2021-08-29 DIAGNOSIS — N185 Chronic kidney disease, stage 5: Secondary | ICD-10-CM | POA: Diagnosis not present

## 2021-08-29 DIAGNOSIS — E1122 Type 2 diabetes mellitus with diabetic chronic kidney disease: Secondary | ICD-10-CM | POA: Diagnosis not present

## 2021-08-29 DIAGNOSIS — D509 Iron deficiency anemia, unspecified: Secondary | ICD-10-CM | POA: Diagnosis not present

## 2021-08-30 DIAGNOSIS — N185 Chronic kidney disease, stage 5: Secondary | ICD-10-CM | POA: Diagnosis not present

## 2021-08-30 DIAGNOSIS — I10 Essential (primary) hypertension: Secondary | ICD-10-CM | POA: Diagnosis not present

## 2021-08-30 DIAGNOSIS — J438 Other emphysema: Secondary | ICD-10-CM | POA: Diagnosis not present

## 2021-08-30 DIAGNOSIS — M6281 Muscle weakness (generalized): Secondary | ICD-10-CM | POA: Diagnosis not present

## 2021-08-30 DIAGNOSIS — E119 Type 2 diabetes mellitus without complications: Secondary | ICD-10-CM | POA: Diagnosis not present

## 2021-08-30 DIAGNOSIS — N186 End stage renal disease: Secondary | ICD-10-CM | POA: Diagnosis not present

## 2021-08-30 DIAGNOSIS — N184 Chronic kidney disease, stage 4 (severe): Secondary | ICD-10-CM | POA: Diagnosis not present

## 2021-08-30 DIAGNOSIS — E7849 Other hyperlipidemia: Secondary | ICD-10-CM | POA: Diagnosis not present

## 2021-08-30 DIAGNOSIS — L89150 Pressure ulcer of sacral region, unstageable: Secondary | ICD-10-CM | POA: Diagnosis not present

## 2021-08-30 DIAGNOSIS — E1122 Type 2 diabetes mellitus with diabetic chronic kidney disease: Secondary | ICD-10-CM | POA: Diagnosis not present

## 2021-08-30 DIAGNOSIS — E875 Hyperkalemia: Secondary | ICD-10-CM | POA: Diagnosis not present

## 2021-08-30 DIAGNOSIS — D509 Iron deficiency anemia, unspecified: Secondary | ICD-10-CM | POA: Diagnosis not present

## 2021-08-30 DIAGNOSIS — I69398 Other sequelae of cerebral infarction: Secondary | ICD-10-CM | POA: Diagnosis not present

## 2021-08-31 DIAGNOSIS — F03C0 Unspecified dementia, severe, without behavioral disturbance, psychotic disturbance, mood disturbance, and anxiety: Secondary | ICD-10-CM | POA: Diagnosis not present

## 2021-08-31 DIAGNOSIS — L89152 Pressure ulcer of sacral region, stage 2: Secondary | ICD-10-CM | POA: Diagnosis not present

## 2021-08-31 DIAGNOSIS — E875 Hyperkalemia: Secondary | ICD-10-CM | POA: Diagnosis not present

## 2021-08-31 DIAGNOSIS — N186 End stage renal disease: Secondary | ICD-10-CM | POA: Diagnosis not present

## 2021-09-01 DIAGNOSIS — E875 Hyperkalemia: Secondary | ICD-10-CM | POA: Diagnosis not present

## 2021-09-01 DIAGNOSIS — E119 Type 2 diabetes mellitus without complications: Secondary | ICD-10-CM | POA: Diagnosis not present

## 2021-09-01 DIAGNOSIS — E7849 Other hyperlipidemia: Secondary | ICD-10-CM | POA: Diagnosis not present

## 2021-09-01 DIAGNOSIS — L89150 Pressure ulcer of sacral region, unstageable: Secondary | ICD-10-CM | POA: Diagnosis not present

## 2021-09-01 DIAGNOSIS — N185 Chronic kidney disease, stage 5: Secondary | ICD-10-CM | POA: Diagnosis not present

## 2021-09-01 DIAGNOSIS — J438 Other emphysema: Secondary | ICD-10-CM | POA: Diagnosis not present

## 2021-09-01 DIAGNOSIS — D509 Iron deficiency anemia, unspecified: Secondary | ICD-10-CM | POA: Diagnosis not present

## 2021-09-01 DIAGNOSIS — N184 Chronic kidney disease, stage 4 (severe): Secondary | ICD-10-CM | POA: Diagnosis not present

## 2021-09-01 DIAGNOSIS — N186 End stage renal disease: Secondary | ICD-10-CM | POA: Diagnosis not present

## 2021-09-01 DIAGNOSIS — E1122 Type 2 diabetes mellitus with diabetic chronic kidney disease: Secondary | ICD-10-CM | POA: Diagnosis not present

## 2021-09-01 DIAGNOSIS — I69398 Other sequelae of cerebral infarction: Secondary | ICD-10-CM | POA: Diagnosis not present

## 2021-09-01 DIAGNOSIS — I10 Essential (primary) hypertension: Secondary | ICD-10-CM | POA: Diagnosis not present

## 2021-09-01 DIAGNOSIS — M6281 Muscle weakness (generalized): Secondary | ICD-10-CM | POA: Diagnosis not present

## 2021-09-02 DIAGNOSIS — N186 End stage renal disease: Secondary | ICD-10-CM | POA: Diagnosis not present

## 2021-09-02 DIAGNOSIS — I69398 Other sequelae of cerebral infarction: Secondary | ICD-10-CM | POA: Diagnosis not present

## 2021-09-02 DIAGNOSIS — E1122 Type 2 diabetes mellitus with diabetic chronic kidney disease: Secondary | ICD-10-CM | POA: Diagnosis not present

## 2021-09-02 DIAGNOSIS — I10 Essential (primary) hypertension: Secondary | ICD-10-CM | POA: Diagnosis not present

## 2021-09-02 DIAGNOSIS — N184 Chronic kidney disease, stage 4 (severe): Secondary | ICD-10-CM | POA: Diagnosis not present

## 2021-09-02 DIAGNOSIS — E875 Hyperkalemia: Secondary | ICD-10-CM | POA: Diagnosis not present

## 2021-09-02 DIAGNOSIS — N185 Chronic kidney disease, stage 5: Secondary | ICD-10-CM | POA: Diagnosis not present

## 2021-09-02 DIAGNOSIS — E7849 Other hyperlipidemia: Secondary | ICD-10-CM | POA: Diagnosis not present

## 2021-09-02 DIAGNOSIS — J438 Other emphysema: Secondary | ICD-10-CM | POA: Diagnosis not present

## 2021-09-02 DIAGNOSIS — L89150 Pressure ulcer of sacral region, unstageable: Secondary | ICD-10-CM | POA: Diagnosis not present

## 2021-09-02 DIAGNOSIS — E119 Type 2 diabetes mellitus without complications: Secondary | ICD-10-CM | POA: Diagnosis not present

## 2021-09-02 DIAGNOSIS — M6281 Muscle weakness (generalized): Secondary | ICD-10-CM | POA: Diagnosis not present

## 2021-09-02 DIAGNOSIS — D509 Iron deficiency anemia, unspecified: Secondary | ICD-10-CM | POA: Diagnosis not present

## 2021-09-04 DIAGNOSIS — L89150 Pressure ulcer of sacral region, unstageable: Secondary | ICD-10-CM | POA: Diagnosis not present

## 2021-09-04 DIAGNOSIS — N184 Chronic kidney disease, stage 4 (severe): Secondary | ICD-10-CM | POA: Diagnosis not present

## 2021-09-04 DIAGNOSIS — M6281 Muscle weakness (generalized): Secondary | ICD-10-CM | POA: Diagnosis not present

## 2021-09-04 DIAGNOSIS — E1122 Type 2 diabetes mellitus with diabetic chronic kidney disease: Secondary | ICD-10-CM | POA: Diagnosis not present

## 2021-09-04 DIAGNOSIS — E7849 Other hyperlipidemia: Secondary | ICD-10-CM | POA: Diagnosis not present

## 2021-09-04 DIAGNOSIS — J438 Other emphysema: Secondary | ICD-10-CM | POA: Diagnosis not present

## 2021-09-04 DIAGNOSIS — I69398 Other sequelae of cerebral infarction: Secondary | ICD-10-CM | POA: Diagnosis not present

## 2021-09-04 DIAGNOSIS — D509 Iron deficiency anemia, unspecified: Secondary | ICD-10-CM | POA: Diagnosis not present

## 2021-09-04 DIAGNOSIS — I10 Essential (primary) hypertension: Secondary | ICD-10-CM | POA: Diagnosis not present

## 2021-09-04 DIAGNOSIS — E119 Type 2 diabetes mellitus without complications: Secondary | ICD-10-CM | POA: Diagnosis not present

## 2021-09-04 DIAGNOSIS — N186 End stage renal disease: Secondary | ICD-10-CM | POA: Diagnosis not present

## 2021-09-04 DIAGNOSIS — E875 Hyperkalemia: Secondary | ICD-10-CM | POA: Diagnosis not present

## 2021-09-04 DIAGNOSIS — N185 Chronic kidney disease, stage 5: Secondary | ICD-10-CM | POA: Diagnosis not present

## 2021-09-05 DIAGNOSIS — E1122 Type 2 diabetes mellitus with diabetic chronic kidney disease: Secondary | ICD-10-CM | POA: Diagnosis not present

## 2021-09-05 DIAGNOSIS — N184 Chronic kidney disease, stage 4 (severe): Secondary | ICD-10-CM | POA: Diagnosis not present

## 2021-09-05 DIAGNOSIS — E119 Type 2 diabetes mellitus without complications: Secondary | ICD-10-CM | POA: Diagnosis not present

## 2021-09-05 DIAGNOSIS — E875 Hyperkalemia: Secondary | ICD-10-CM | POA: Diagnosis not present

## 2021-09-05 DIAGNOSIS — M6281 Muscle weakness (generalized): Secondary | ICD-10-CM | POA: Diagnosis not present

## 2021-09-05 DIAGNOSIS — N185 Chronic kidney disease, stage 5: Secondary | ICD-10-CM | POA: Diagnosis not present

## 2021-09-05 DIAGNOSIS — J438 Other emphysema: Secondary | ICD-10-CM | POA: Diagnosis not present

## 2021-09-05 DIAGNOSIS — L89150 Pressure ulcer of sacral region, unstageable: Secondary | ICD-10-CM | POA: Diagnosis not present

## 2021-09-05 DIAGNOSIS — I10 Essential (primary) hypertension: Secondary | ICD-10-CM | POA: Diagnosis not present

## 2021-09-05 DIAGNOSIS — E7849 Other hyperlipidemia: Secondary | ICD-10-CM | POA: Diagnosis not present

## 2021-09-05 DIAGNOSIS — N186 End stage renal disease: Secondary | ICD-10-CM | POA: Diagnosis not present

## 2021-09-05 DIAGNOSIS — I69398 Other sequelae of cerebral infarction: Secondary | ICD-10-CM | POA: Diagnosis not present

## 2021-09-05 DIAGNOSIS — D509 Iron deficiency anemia, unspecified: Secondary | ICD-10-CM | POA: Diagnosis not present

## 2021-09-07 DIAGNOSIS — N184 Chronic kidney disease, stage 4 (severe): Secondary | ICD-10-CM | POA: Diagnosis not present

## 2021-09-07 DIAGNOSIS — E119 Type 2 diabetes mellitus without complications: Secondary | ICD-10-CM | POA: Diagnosis not present

## 2021-09-07 DIAGNOSIS — Z79899 Other long term (current) drug therapy: Secondary | ICD-10-CM | POA: Diagnosis not present

## 2021-09-07 DIAGNOSIS — E875 Hyperkalemia: Secondary | ICD-10-CM | POA: Diagnosis not present

## 2021-09-07 DIAGNOSIS — M6281 Muscle weakness (generalized): Secondary | ICD-10-CM | POA: Diagnosis not present

## 2021-09-07 DIAGNOSIS — J438 Other emphysema: Secondary | ICD-10-CM | POA: Diagnosis not present

## 2021-09-07 DIAGNOSIS — I120 Hypertensive chronic kidney disease with stage 5 chronic kidney disease or end stage renal disease: Secondary | ICD-10-CM | POA: Diagnosis not present

## 2021-09-07 DIAGNOSIS — N185 Chronic kidney disease, stage 5: Secondary | ICD-10-CM | POA: Diagnosis not present

## 2021-09-07 DIAGNOSIS — N186 End stage renal disease: Secondary | ICD-10-CM | POA: Diagnosis not present

## 2021-09-07 DIAGNOSIS — D509 Iron deficiency anemia, unspecified: Secondary | ICD-10-CM | POA: Diagnosis not present

## 2021-09-07 DIAGNOSIS — E1122 Type 2 diabetes mellitus with diabetic chronic kidney disease: Secondary | ICD-10-CM | POA: Diagnosis not present

## 2021-09-07 DIAGNOSIS — L89150 Pressure ulcer of sacral region, unstageable: Secondary | ICD-10-CM | POA: Diagnosis not present

## 2021-09-07 DIAGNOSIS — I10 Essential (primary) hypertension: Secondary | ICD-10-CM | POA: Diagnosis not present

## 2021-09-07 DIAGNOSIS — I69398 Other sequelae of cerebral infarction: Secondary | ICD-10-CM | POA: Diagnosis not present

## 2021-09-07 DIAGNOSIS — E7849 Other hyperlipidemia: Secondary | ICD-10-CM | POA: Diagnosis not present

## 2021-09-08 DIAGNOSIS — R109 Unspecified abdominal pain: Secondary | ICD-10-CM | POA: Diagnosis not present

## 2021-09-08 DIAGNOSIS — Z79899 Other long term (current) drug therapy: Secondary | ICD-10-CM | POA: Diagnosis not present

## 2021-09-08 DIAGNOSIS — I120 Hypertensive chronic kidney disease with stage 5 chronic kidney disease or end stage renal disease: Secondary | ICD-10-CM | POA: Diagnosis not present

## 2021-09-09 DIAGNOSIS — I120 Hypertensive chronic kidney disease with stage 5 chronic kidney disease or end stage renal disease: Secondary | ICD-10-CM | POA: Diagnosis not present

## 2021-09-09 DIAGNOSIS — N390 Urinary tract infection, site not specified: Secondary | ICD-10-CM | POA: Diagnosis not present

## 2021-09-10 DIAGNOSIS — R7989 Other specified abnormal findings of blood chemistry: Secondary | ICD-10-CM | POA: Diagnosis not present

## 2021-09-11 ENCOUNTER — Encounter (HOSPITAL_COMMUNITY): Payer: Self-pay

## 2021-09-11 DIAGNOSIS — R7989 Other specified abnormal findings of blood chemistry: Secondary | ICD-10-CM | POA: Diagnosis not present

## 2021-09-11 DIAGNOSIS — L89152 Pressure ulcer of sacral region, stage 2: Secondary | ICD-10-CM | POA: Diagnosis not present

## 2021-09-11 DIAGNOSIS — Z79899 Other long term (current) drug therapy: Secondary | ICD-10-CM | POA: Diagnosis not present

## 2021-09-11 DIAGNOSIS — E7849 Other hyperlipidemia: Secondary | ICD-10-CM | POA: Diagnosis not present

## 2021-09-11 DIAGNOSIS — N184 Chronic kidney disease, stage 4 (severe): Secondary | ICD-10-CM | POA: Diagnosis not present

## 2021-09-11 DIAGNOSIS — N281 Cyst of kidney, acquired: Secondary | ICD-10-CM | POA: Diagnosis not present

## 2021-09-11 DIAGNOSIS — E875 Hyperkalemia: Secondary | ICD-10-CM | POA: Diagnosis not present

## 2021-09-11 DIAGNOSIS — J438 Other emphysema: Secondary | ICD-10-CM | POA: Diagnosis not present

## 2021-09-11 DIAGNOSIS — L89150 Pressure ulcer of sacral region, unstageable: Secondary | ICD-10-CM | POA: Diagnosis not present

## 2021-09-11 DIAGNOSIS — I10 Essential (primary) hypertension: Secondary | ICD-10-CM | POA: Diagnosis not present

## 2021-09-11 DIAGNOSIS — E1122 Type 2 diabetes mellitus with diabetic chronic kidney disease: Secondary | ICD-10-CM | POA: Diagnosis not present

## 2021-09-11 DIAGNOSIS — N186 End stage renal disease: Secondary | ICD-10-CM | POA: Diagnosis not present

## 2021-09-11 DIAGNOSIS — I69398 Other sequelae of cerebral infarction: Secondary | ICD-10-CM | POA: Diagnosis not present

## 2021-09-11 DIAGNOSIS — N185 Chronic kidney disease, stage 5: Secondary | ICD-10-CM | POA: Diagnosis not present

## 2021-09-11 DIAGNOSIS — M6281 Muscle weakness (generalized): Secondary | ICD-10-CM | POA: Diagnosis not present

## 2021-09-11 DIAGNOSIS — F03C0 Unspecified dementia, severe, without behavioral disturbance, psychotic disturbance, mood disturbance, and anxiety: Secondary | ICD-10-CM | POA: Diagnosis not present

## 2021-09-11 DIAGNOSIS — D509 Iron deficiency anemia, unspecified: Secondary | ICD-10-CM | POA: Diagnosis not present

## 2021-09-11 DIAGNOSIS — I120 Hypertensive chronic kidney disease with stage 5 chronic kidney disease or end stage renal disease: Secondary | ICD-10-CM | POA: Diagnosis not present

## 2021-09-11 DIAGNOSIS — E119 Type 2 diabetes mellitus without complications: Secondary | ICD-10-CM | POA: Diagnosis not present

## 2021-09-11 DIAGNOSIS — N390 Urinary tract infection, site not specified: Secondary | ICD-10-CM | POA: Diagnosis not present

## 2021-09-11 NOTE — Progress Notes (Signed)
Attempted to reach patient regarding LDCT. Unable to reach patient at this time. No VM available.

## 2021-09-11 NOTE — Progress Notes (Signed)
Attempted to reach patient regarding follow-up LDCT. Unable to reach patient at this time

## 2021-09-12 DIAGNOSIS — E875 Hyperkalemia: Secondary | ICD-10-CM | POA: Diagnosis not present

## 2021-09-12 DIAGNOSIS — E119 Type 2 diabetes mellitus without complications: Secondary | ICD-10-CM | POA: Diagnosis not present

## 2021-09-12 DIAGNOSIS — L89150 Pressure ulcer of sacral region, unstageable: Secondary | ICD-10-CM | POA: Diagnosis not present

## 2021-09-12 DIAGNOSIS — D509 Iron deficiency anemia, unspecified: Secondary | ICD-10-CM | POA: Diagnosis not present

## 2021-09-12 DIAGNOSIS — I69398 Other sequelae of cerebral infarction: Secondary | ICD-10-CM | POA: Diagnosis not present

## 2021-09-12 DIAGNOSIS — N184 Chronic kidney disease, stage 4 (severe): Secondary | ICD-10-CM | POA: Diagnosis not present

## 2021-09-12 DIAGNOSIS — E1122 Type 2 diabetes mellitus with diabetic chronic kidney disease: Secondary | ICD-10-CM | POA: Diagnosis not present

## 2021-09-12 DIAGNOSIS — N186 End stage renal disease: Secondary | ICD-10-CM | POA: Diagnosis not present

## 2021-09-12 DIAGNOSIS — N185 Chronic kidney disease, stage 5: Secondary | ICD-10-CM | POA: Diagnosis not present

## 2021-09-12 DIAGNOSIS — I10 Essential (primary) hypertension: Secondary | ICD-10-CM | POA: Diagnosis not present

## 2021-09-12 DIAGNOSIS — J438 Other emphysema: Secondary | ICD-10-CM | POA: Diagnosis not present

## 2021-09-12 DIAGNOSIS — M6281 Muscle weakness (generalized): Secondary | ICD-10-CM | POA: Diagnosis not present

## 2021-09-12 DIAGNOSIS — E7849 Other hyperlipidemia: Secondary | ICD-10-CM | POA: Diagnosis not present

## 2021-09-13 DIAGNOSIS — E119 Type 2 diabetes mellitus without complications: Secondary | ICD-10-CM | POA: Diagnosis not present

## 2021-09-13 DIAGNOSIS — L89150 Pressure ulcer of sacral region, unstageable: Secondary | ICD-10-CM | POA: Diagnosis not present

## 2021-09-13 DIAGNOSIS — N185 Chronic kidney disease, stage 5: Secondary | ICD-10-CM | POA: Diagnosis not present

## 2021-09-13 DIAGNOSIS — N139 Obstructive and reflux uropathy, unspecified: Secondary | ICD-10-CM | POA: Diagnosis not present

## 2021-09-13 DIAGNOSIS — E7849 Other hyperlipidemia: Secondary | ICD-10-CM | POA: Diagnosis not present

## 2021-09-13 DIAGNOSIS — I69398 Other sequelae of cerebral infarction: Secondary | ICD-10-CM | POA: Diagnosis not present

## 2021-09-13 DIAGNOSIS — E875 Hyperkalemia: Secondary | ICD-10-CM | POA: Diagnosis not present

## 2021-09-13 DIAGNOSIS — M6281 Muscle weakness (generalized): Secondary | ICD-10-CM | POA: Diagnosis not present

## 2021-09-13 DIAGNOSIS — N390 Urinary tract infection, site not specified: Secondary | ICD-10-CM | POA: Diagnosis not present

## 2021-09-13 DIAGNOSIS — N186 End stage renal disease: Secondary | ICD-10-CM | POA: Diagnosis not present

## 2021-09-13 DIAGNOSIS — I10 Essential (primary) hypertension: Secondary | ICD-10-CM | POA: Diagnosis not present

## 2021-09-13 DIAGNOSIS — D509 Iron deficiency anemia, unspecified: Secondary | ICD-10-CM | POA: Diagnosis not present

## 2021-09-13 DIAGNOSIS — N184 Chronic kidney disease, stage 4 (severe): Secondary | ICD-10-CM | POA: Diagnosis not present

## 2021-09-13 DIAGNOSIS — J438 Other emphysema: Secondary | ICD-10-CM | POA: Diagnosis not present

## 2021-09-13 DIAGNOSIS — E1122 Type 2 diabetes mellitus with diabetic chronic kidney disease: Secondary | ICD-10-CM | POA: Diagnosis not present

## 2021-09-14 DIAGNOSIS — L89152 Pressure ulcer of sacral region, stage 2: Secondary | ICD-10-CM | POA: Diagnosis not present

## 2021-09-14 DIAGNOSIS — E1122 Type 2 diabetes mellitus with diabetic chronic kidney disease: Secondary | ICD-10-CM | POA: Diagnosis not present

## 2021-09-14 DIAGNOSIS — N186 End stage renal disease: Secondary | ICD-10-CM | POA: Diagnosis not present

## 2021-09-14 DIAGNOSIS — F03C0 Unspecified dementia, severe, without behavioral disturbance, psychotic disturbance, mood disturbance, and anxiety: Secondary | ICD-10-CM | POA: Diagnosis not present

## 2021-09-15 DIAGNOSIS — Z79899 Other long term (current) drug therapy: Secondary | ICD-10-CM | POA: Diagnosis not present

## 2021-10-04 DEATH — deceased
